# Patient Record
Sex: Female | Born: 1945
Health system: Southern US, Community
[De-identification: ages and names within clinical notes are randomized; demographics above are authoritative.]

## PROBLEM LIST (undated history)

## (undated) DIAGNOSIS — K224 Dyskinesia of esophagus: Secondary | ICD-10-CM

## (undated) DIAGNOSIS — J189 Pneumonia, unspecified organism: Secondary | ICD-10-CM

## (undated) DIAGNOSIS — M1990 Unspecified osteoarthritis, unspecified site: Secondary | ICD-10-CM

## (undated) DIAGNOSIS — E785 Hyperlipidemia, unspecified: Secondary | ICD-10-CM

## (undated) DIAGNOSIS — I10 Essential (primary) hypertension: Secondary | ICD-10-CM

## (undated) DIAGNOSIS — K219 Gastro-esophageal reflux disease without esophagitis: Secondary | ICD-10-CM

## (undated) DIAGNOSIS — K7030 Alcoholic cirrhosis of liver without ascites: Secondary | ICD-10-CM

## (undated) DIAGNOSIS — Z8489 Family history of other specified conditions: Secondary | ICD-10-CM

## (undated) DIAGNOSIS — F1020 Alcohol dependence, uncomplicated: Secondary | ICD-10-CM

## (undated) HISTORY — PX: TOTAL LAPAROSCOPIC HYSTERECTOMY WITH BILATERAL SALPINGO OOPHORECTOMY: SHX6845

## (undated) HISTORY — PX: OTHER SURGICAL HISTORY: SHX169

## (undated) HISTORY — PX: BUNIONECTOMY: SHX129

## (undated) HISTORY — PX: ABDOMINAL HYSTERECTOMY: SHX81

## (undated) HISTORY — DX: Hyperlipidemia, unspecified: E78.5

---

## 2004-11-26 ENCOUNTER — Ambulatory Visit: Payer: Self-pay | Admitting: Internal Medicine

## 2004-12-12 ENCOUNTER — Ambulatory Visit: Payer: Self-pay | Admitting: Internal Medicine

## 2005-06-13 ENCOUNTER — Other Ambulatory Visit: Payer: Self-pay

## 2005-06-13 ENCOUNTER — Emergency Department: Payer: Self-pay | Admitting: Internal Medicine

## 2005-06-16 ENCOUNTER — Ambulatory Visit: Payer: Self-pay | Admitting: Internal Medicine

## 2005-12-14 ENCOUNTER — Ambulatory Visit: Payer: Self-pay | Admitting: Internal Medicine

## 2007-01-18 ENCOUNTER — Ambulatory Visit: Payer: Self-pay | Admitting: Internal Medicine

## 2007-06-22 LAB — HM COLONOSCOPY: HM Colonoscopy: NORMAL

## 2008-02-16 LAB — HM PAP SMEAR

## 2008-02-16 LAB — CONVERTED CEMR LAB: Pap Smear: NORMAL

## 2009-12-13 ENCOUNTER — Ambulatory Visit: Payer: Self-pay | Admitting: Family Medicine

## 2009-12-13 ENCOUNTER — Encounter: Payer: Self-pay | Admitting: Family Medicine

## 2009-12-13 DIAGNOSIS — E785 Hyperlipidemia, unspecified: Secondary | ICD-10-CM | POA: Insufficient documentation

## 2009-12-13 DIAGNOSIS — M949 Disorder of cartilage, unspecified: Secondary | ICD-10-CM

## 2009-12-13 DIAGNOSIS — R030 Elevated blood-pressure reading, without diagnosis of hypertension: Secondary | ICD-10-CM | POA: Insufficient documentation

## 2009-12-13 DIAGNOSIS — M899 Disorder of bone, unspecified: Secondary | ICD-10-CM | POA: Insufficient documentation

## 2010-06-26 ENCOUNTER — Ambulatory Visit: Payer: Self-pay | Admitting: Family Medicine

## 2010-06-26 DIAGNOSIS — F438 Other reactions to severe stress: Secondary | ICD-10-CM | POA: Insufficient documentation

## 2010-06-27 LAB — CONVERTED CEMR LAB
ALT: 79 U/L — ABNORMAL HIGH
AST: 51 U/L — ABNORMAL HIGH
Albumin: 4.1 g/dL
Alkaline Phosphatase: 69 U/L
Bilirubin, Direct: 0.2 mg/dL
Cholesterol: 264 mg/dL — ABNORMAL HIGH
Direct LDL: 191.5 mg/dL
HDL: 61.6 mg/dL
Total Bilirubin: 1 mg/dL
Total CHOL/HDL Ratio: 4
Total Protein: 7.2 g/dL
Triglycerides: 97 mg/dL
VLDL: 19.4 mg/dL

## 2010-07-22 ENCOUNTER — Encounter: Payer: Self-pay | Admitting: Family Medicine

## 2010-07-30 LAB — HM MAMMOGRAPHY: HM Mammogram: NORMAL

## 2010-09-25 ENCOUNTER — Emergency Department: Payer: Self-pay | Admitting: Emergency Medicine

## 2010-09-25 ENCOUNTER — Encounter: Payer: Self-pay | Admitting: Family Medicine

## 2010-09-25 ENCOUNTER — Ambulatory Visit: Payer: Self-pay | Admitting: Family Medicine

## 2010-09-25 DIAGNOSIS — I10 Essential (primary) hypertension: Secondary | ICD-10-CM | POA: Insufficient documentation

## 2010-09-25 DIAGNOSIS — R42 Dizziness and giddiness: Secondary | ICD-10-CM | POA: Insufficient documentation

## 2010-09-25 DIAGNOSIS — I495 Sick sinus syndrome: Secondary | ICD-10-CM | POA: Insufficient documentation

## 2010-09-29 ENCOUNTER — Ambulatory Visit: Payer: Self-pay | Admitting: Family Medicine

## 2010-09-29 DIAGNOSIS — K224 Dyskinesia of esophagus: Secondary | ICD-10-CM | POA: Insufficient documentation

## 2010-10-17 ENCOUNTER — Telehealth (INDEPENDENT_AMBULATORY_CARE_PROVIDER_SITE_OTHER): Payer: Self-pay | Admitting: *Deleted

## 2010-10-21 ENCOUNTER — Ambulatory Visit: Payer: Self-pay | Admitting: Family Medicine

## 2010-10-27 ENCOUNTER — Ambulatory Visit: Payer: Self-pay | Admitting: Family Medicine

## 2010-10-27 DIAGNOSIS — R7401 Elevation of levels of liver transaminase levels: Secondary | ICD-10-CM | POA: Insufficient documentation

## 2010-10-27 DIAGNOSIS — R740 Nonspecific elevation of levels of transaminase and lactic acid dehydrogenase [LDH]: Secondary | ICD-10-CM

## 2010-10-27 DIAGNOSIS — R7402 Elevation of levels of lactic acid dehydrogenase (LDH): Secondary | ICD-10-CM | POA: Insufficient documentation

## 2010-11-19 ENCOUNTER — Ambulatory Visit: Payer: Self-pay | Admitting: Family Medicine

## 2010-11-19 DIAGNOSIS — J018 Other acute sinusitis: Secondary | ICD-10-CM | POA: Insufficient documentation

## 2010-11-28 ENCOUNTER — Ambulatory Visit: Payer: Self-pay | Admitting: Family Medicine

## 2010-11-28 ENCOUNTER — Telehealth (INDEPENDENT_AMBULATORY_CARE_PROVIDER_SITE_OTHER): Payer: Self-pay | Admitting: *Deleted

## 2010-11-28 DIAGNOSIS — J019 Acute sinusitis, unspecified: Secondary | ICD-10-CM | POA: Insufficient documentation

## 2011-01-06 NOTE — Assessment & Plan Note (Signed)
Summary: F/U [facility] ER ON 09/25/10/CLE   Vital Signs:  Patient profile:   65 year old female Height:      66 inches Weight:      184 pounds BMI:     29.81 Temp:     98.3 degrees F oral Pulse rate:   68 / minute Pulse rhythm:   regular BP sitting:   150 / 84  (right arm) Cuff size:   large  Vitals Entered By: Linde Gillis CMA Duncan Dull) (September 29, 2010 2:46 PM)  Serial Vital Signs/Assessments:  Time      Position  BP       Pulse  Resp  Temp     By                     140/82                         Ruthe Mannan MD  CC: ER follow up    History of Present Illness: 65 yo here for ER follow up.  Went to [facility] on 10/20 s/p syncopal episode. Was standing at sink, drank something warm and felt like it stuck.  Walked a few feet to the garage, felt "funny" and woke up on the floor. Hit her head and her knee but immediately regained consciousness. Husband was there. No confusion, loss of bladder or bowel. No visible seizures.  Does not routinely pass out but has had that sensation that something was stuck before. [facility] records reviewed. Cardiac enzymes negative. Head CT neg EKG within normal limits.   CBC, CMET within normal limits.   No recurrent episodes.    Current Medications (verified): 1)  Aspirin 81 Mg  Tabs (Aspirin) .... Take 1 Tablet By Mouth Once A Day 2)  Fish Oil   Oil (Fish Oil) .Marland Kitchen.. 1200 Mg. Capsules.  Takes 3 By Mouth Once Daily 3)  Calcium Carbonate-Vitamin D 600-400 Mg-Unit  Tabs (Calcium Carbonate-Vitamin D) .Marland Kitchen.. 1200 Mg.  Takes 2 Tablets By Mouth Once Daily 4)  Co Q-10 30 Mg  Caps (Coenzyme Q10) .Marland Kitchen.. 100 Mg. Takes 1 Capsule By Mouth Once Daily 5)  Milk Thistle 500 Mg Caps (Milk Thistle) .Marland Kitchen.. 1000 Mg. Capsule   Take 1 Capsule By Mouth Once Daily 6)  Alprazolam 0.25 Mg Tabs (Alprazolam) .Marland Kitchen.. 1 Tab By Mouth Three Times A Day As Needed Anxiety 7)  Pravastatin Sodium 10 Mg  Tabs (Pravastatin Sodium) .... One By Mouth At Bedtime  Allergies (verified): No Known Drug  Allergies  Past History:  Past Medical History: Last updated: 2009-12-27 Hyperlipidemia  Past Surgical History: Last updated: 2009/12/27 Hysterectomy 1992- menorrhagia  Family History: Last updated: 12/27/2009 Dad -died of complications of aortic valve replacement at 13 Mom -died of CHF at 58.  Social History: Last updated: December 27, 2009 [place] in [place].  Moved back here to be closer to children and granchildren from [place].   Never Smoked Alcohol use-no Drug use-no Regular exercise-yes  Risk Factors: Exercise: yes (2009/12/27)  Risk Factors: Smoking Status: never (12-27-09)  Review of Systems      See HPI General:  Denies malaise. Eyes:  Denies blurring. ENT:  Denies difficulty swallowing. CV:  Denies chest pain or discomfort. Resp:  Denies shortness of breath. GI:  Denies bloody stools, nausea, and vomiting.  Physical Exam  General:  Well-developed,well-nourished,in no acute distress; alert,appropriate and cooperative throughout examination Lungs:  Normal respiratory effort, chest expands symmetrically. Lungs are clear to  auscultation, no crackles or wheezes. Heart:  Normal rate and regular rhythm. S1 and S2 normal without gallop, murmur, click, rub or other extra sounds. Abdomen:  Bowel sounds positive,abdomen soft and non-tender without masses, organomegaly or hernias noted. Extremities:  no edema Neurologic:  alert & oriented X3 and gait normal.   Psych:  Cognition and judgment appear intact. Alert and cooperative with normal attention span and concentration. No apparent delusions, illusions, hallucinations   Impression & Recommendations:  Problem # 1:  SYNCOPE (ICD-780.2) Assessment New resolved, likely vasavagal. Likely due to esophageal spasm.  See below.    Problem # 2:  ESOPHAGEAL SPASM (ICD-530.5) Assessment: Comment Only Discussed causes and treatment for esophageal spasm.  Per pt, had negative EGD. If recurrs, deiscussed diltazem.   For  now, PPI and benzo seem to help.  Complete Medication List: 1)  Aspirin 81 Mg Tabs (Aspirin) .... Take 1 tablet by mouth once a day 2)  Fish Oil Oil (Fish oil) .Marland Kitchen.. 1200 mg. capsules.  takes 3 by mouth once daily 3)  Calcium Carbonate-vitamin D 600-400 Mg-unit Tabs (Calcium carbonate-vitamin d) .Marland Kitchen.. 1200 mg.  takes 2 tablets by mouth once daily 4)  Co Q-10 30 Mg Caps (Coenzyme q10) .Marland Kitchen.. 100 mg. takes 1 capsule by mouth once daily 5)  Milk Thistle 500 Mg Caps (Milk thistle) .Marland Kitchen.. 1000 mg. capsule   take 1 capsule by mouth once daily 6)  Alprazolam 0.25 Mg Tabs (Alprazolam) .Marland Kitchen.. 1 tab by mouth three times a day as needed anxiety 7)  Pravastatin Sodium 10 Mg Tabs (Pravastatin sodium) .... One by mouth at bedtime Prescriptions: ALPRAZOLAM 0.25 MG TABS (ALPRAZOLAM) 1 tab by mouth three times a day as needed anxiety  #30 x 0   Entered and Authorized by:   Ruthe Mannan MD   Signed by:   Ruthe Mannan MD on 09/29/2010   Method used:   Print then Give to Patient   RxID:   [id number]    Orders Added: 1)  Est. Patient Level IV [08657]    Current Allergies (reviewed today): No known allergies  Appended Document: F/U [facility] ER ON 09/25/10/CLE     Allergies: No Known Drug Allergies   Complete Medication List: 1)  Aspirin 81 Mg Tabs (Aspirin) .... Take 1 tablet by mouth once a day 2)  Fish Oil Oil (Fish oil) .Marland Kitchen.. 1200 mg. capsules.  takes 3 by mouth once daily 3)  Calcium Carbonate-vitamin D 600-400 Mg-unit Tabs (Calcium carbonate-vitamin d) .Marland Kitchen.. 1200 mg.  takes 2 tablets by mouth once daily 4)  Co Q-10 30 Mg Caps (Coenzyme q10) .Marland Kitchen.. 100 mg. takes 1 capsule by mouth once daily 5)  Milk Thistle 500 Mg Caps (Milk thistle) .Marland Kitchen.. 1000 mg. capsule   take 1 capsule by mouth once daily 6)  Alprazolam 0.25 Mg Tabs (Alprazolam) .Marland Kitchen.. 1 tab by mouth three times a day as needed anxiety 7)  Pravastatin Sodium 10 Mg Tabs (Pravastatin sodium) .... One by mouth at bedtime  Other Orders: Admin 1st Vaccine  (84696) Flu Vaccine 70yrs + [phone])   Orders Added: 1)  Admin 1st Vaccine [90471] 2)  Flu Vaccine 2yrs + [41324]    Flu Vaccine Consent Questions     Do you have a history of severe allergic reactions to this vaccine? no    Any prior history of allergic reactions to egg and/or gelatin? no    Do you have a sensitivity to the preservative Thimersol? no    Do you have a past  history of Guillan-Barre Syndrome? no    Do you currently have an acute febrile illness? no    Have you ever had a severe reaction to latex? no    Vaccine information given and explained to patient? yes    Are you currently pregnant? no    Lot Number:[id number]   Exp Date:06/06/2011   Site Given  Left Deltoid IM       .[email or web address]

## 2011-01-06 NOTE — Assessment & Plan Note (Signed)
Summary: NEW PT TO ESTABH/DLO   Vital Signs:  Patient profile:   65 year old female Height:      66 inches Weight:      185.38 pounds BMI:     30.03 Temp:     98.6 degrees F oral Pulse rate:   76 / minute Pulse rhythm:   regular BP sitting:   156 / 78  (left arm) Cuff size:   regular  Vitals Entered By: Delilah Shan CMA (AAMA) (December 13, 2009 11:00 AM) CC: New Patient to Establish   History of Present Illness: 65 yo here to establish care, relocating here from [place] to be closer to her children and grandchildren.  HLD- Has had HLD for years.  Has tried multiple statins which caused myalgias and GERD. Had labs checked in 11/10- LDL was 194, TG 84.  Started Fish Oil Tablets 1200 mg three times a day at that time to see if hopefully that would help.  No personal or family h/o CAD.  Non diabetic.   Of note, history of elevated LFTs for years.  Elevated BP reading- told she has white coat HTN.  When she checks it at home, 120s-130s range, she cannot remember diastolic readings.  No CP, SOB, blurred vision, or HA.  Osteopenia- BMD on 02/28/2009- -1.3 at left femoral neck, -1.3 at right femoral neck.  Has been taking Caltrate daily.  Well woman- UTD mammogram, pap, FLP, BMET. Never had zostavax. She and her husband recently joined [employer].  Preventive Screening-Counseling & Management  Alcohol-Tobacco     Smoking Status: never  Caffeine-Diet-Exercise     Does Patient Exercise: yes      Drug Use:  no.    Allergies (verified): No Known Drug Allergies  Past History:  Past Medical History: Hyperlipidemia  Past Surgical History: Hysterectomy 1992- menorrhagia  Family History: Dad -died of complications of aortic valve replacement at 82 Mom -died of CHF at 38.  Social History: LI[employer] in [place].  Moved back here to be closer to children and granchildren from [place].   Never Smoked Alcohol use-no Drug use-no Regular exercise-yes Smoking Status:  never Drug Use:   no Does Patient Exercise:  yes  Review of Systems      See HPI General:  Denies fever and weakness. Eyes:  Denies blurring. ENT:  Denies difficulty swallowing. CV:  Denies chest pain or discomfort. Resp:  Denies shortness of breath. GI:  Denies abdominal pain and bloody stools. Derm:  Denies rash. Neuro:  Denies headaches and visual disturbances. Psych:  Denies anxiety and depression.  Physical Exam  General:  Well-developed,well-nourished,in no acute distress; alert,appropriate and cooperative throughout examination Eyes:  No corneal or conjunctival inflammation noted. EOMI. Perrla. Funduscopic exam benign, without hemorrhages, exudates or papilledema. Vision grossly normal. Ears:  External ear exam shows no significant lesions or deformities.  Otoscopic examination reveals clear canals, tympanic membranes are intact bilaterally without bulging, retraction, inflammation or discharge. Hearing is grossly normal bilaterally. Mouth:  Oral mucosa and oropharynx without lesions or exudates.  Teeth in good repair. Lungs:  Normal respiratory effort, chest expands symmetrically. Lungs are clear to auscultation, no crackles or wheezes. Heart:  Normal rate and regular rhythm. S1 and S2 normal without gallop, murmur, click, rub or other extra sounds. Abdomen:  Bowel sounds positive,abdomen soft and non-tender without masses, organomegaly or hernias noted. Msk:  No deformity or scoliosis noted of thoracic or lumbar spine.   Extremities:  No clubbing, cyanosis, edema, or deformity  noted with normal full range of motion of all joints.   Skin:  Intact without suspicious lesions or rashes Psych:  Cognition and judgment appear intact. Alert and cooperative with normal attention span and concentration. No apparent delusions, illusions, hallucinations   Impression & Recommendations:  Problem # 1:  HYPERLIPIDEMIA (ICD-272.4) Assessment Unchanged Will try fish oil although counselled her that it will  likely take medical managment, can try another class like gemfibrozil.  Recheck in 6 months.  Problem # 2:  ELEVATED BP READING WITHOUT DX HYPERTENSION (ICD-796.2) Assessment: New May be white coat HTN.  Advised to keep a log, bring to next appt.  Problem # 3:  Preventive Health Care (ICD-V70.0) Assessment: Comment Only Reviewed preventive care protocols, scheduled due services, and updated immunizations Discussed nutrition, exercise, diet, and healthy lifestyle. UTD on all except Zostavax.  She will check with her insurance company.  Problem # 4:  OSTEOPENIA (ICD-733.90) Assessment: Unchanged Continue caltrate.  Repeat in 2 years. Her updated medication list for this problem includes:    Calcium Carbonate-vitamin D 600-400 Mg-unit Tabs (Calcium carbonate-vitamin d) .Marland Kitchen... 1200 mg.  takes 2 tablets by mouth once daily  Complete Medication List: 1)  Aspirin 81 Mg Tabs (Aspirin) .... Take 1 tablet by mouth once a day 2)  Fish Oil Oil (Fish oil) .Marland Kitchen.. 1200 mg. capsules.  takes 3 by mouth once daily 3)  Calcium Carbonate-vitamin D 600-400 Mg-unit Tabs (Calcium carbonate-vitamin d) .Marland Kitchen.. 1200 mg.  takes 2 tablets by mouth once daily 4)  Co Q-10 30 Mg Caps (Coenzyme q10) .Marland Kitchen.. 100 mg. takes 1 capsule by mouth once daily 5)  Milk Thistle 500 Mg Caps (Milk thistle) .Marland Kitchen.. 1000 mg. capsule   take 1 capsule by mouth once daily  Patient Instructions: 1)  It was really nice to meet you, Ms. Crooker. 2)  Make a morning appointment to come see me in 6 months fasting. 3)  Have a wonderful weekend. 4)  Please check with your insurance company to see if they cover Zostavax (shingles vaccine).  Current Allergies (reviewed today): No known allergies   Flex Sig Next Due:  Not Indicated Colonoscopy Result Date:  06/22/2007 Colonoscopy Result:  normal Colonoscopy Next Due:  10 yr Hemoccult Next Due:  Not Indicated PAP Result Date:  02/16/2008 PAP Result:  normal PAP Next Due:  Not Indicated Mammogram  Result Date:  02/15/2009 Mammogram Result:  normal Mammogram Next Due:  1 yr BMD osteopenia 02/2009

## 2011-01-06 NOTE — Assessment & Plan Note (Signed)
Summary: DISCUSS CHOLESTEROL AND LIVER TEST/NT   Vital Signs:  Patient profile:   65 year old female Height:      66 inches Weight:      185 pounds BMI:     29.97 Temp:     98.2 degrees F oral Pulse rate:   72 / minute Pulse rhythm:   regular BP sitting:   140 / 90  (left arm) Cuff size:   large  Vitals Entered By: Linde Gillis CMA Duncan Dull) (October 27, 2010 8:09 AM) CC: discuss cholesterol and liver test   History of Present Illness: 65 yo here to follow up cholesterol.  HLD- Has had HLD for years.  Has tried multiple statins which caused myalgias and GERD.  Started Pravastatin 10 mg in July, tolerating it very well. Lipid panel improved but not yet at goal.  LDL 152 ( was 191), HDL 77, TG 91.   Of note, history of elevated LFTs for years.  Only slightly increased from July, since starting Pravachol- AST 65 ( was 51), ALT 96 ( was 79).  Elevated BP reading- told she has white coat HTN.  When she checks it at home, 120s-130s range.  No CP, HA, blurred vision, or SOB.    Current Medications (verified): 1)  Aspirin 81 Mg  Tabs (Aspirin) .... Take 1 Tablet By Mouth Once A Day 2)  Fish Oil   Oil (Fish Oil) .Marland Kitchen.. 1200 Mg. Capsules.  Takes 3 By Mouth Once Daily 3)  Calcium Carbonate-Vitamin D 600-400 Mg-Unit  Tabs (Calcium Carbonate-Vitamin D) .Marland Kitchen.. 1200 Mg.  Takes 2 Tablets By Mouth Once Daily 4)  Co Q-10 30 Mg  Caps (Coenzyme Q10) .Marland Kitchen.. 100 Mg. Takes 1 Capsule By Mouth Once Daily 5)  Milk Thistle 500 Mg Caps (Milk Thistle) .Marland Kitchen.. 1000 Mg. Capsule   Take 1 Capsule By Mouth Once Daily 6)  Alprazolam 0.25 Mg Tabs (Alprazolam) .Marland Kitchen.. 1 Tab By Mouth Three Times A Day As Needed Anxiety 7)  Pravastatin Sodium 20 Mg  Tabs (Pravastatin Sodium) .... One By Mouth At Bedtime  Allergies (verified): No Known Drug Allergies  Past History:  Past Medical History: Last updated: 12/27/2009 Hyperlipidemia  Past Surgical History: Last updated: 12-27-09 Hysterectomy 1992- menorrhagia  Family  History: Last updated: 12/27/09 Dad -died of complications of aortic valve replacement at 39 Mom -died of CHF at 71.  Social History: Last updated: 12-27-2009 [employer] in [place].  Moved back here to be closer to children and granchildren from [place].   Never Smoked Alcohol use-no Drug use-no Regular exercise-yes  Risk Factors: Exercise: yes (2009-12-27)  Risk Factors: Smoking Status: never (12/27/09)  Review of Systems      See HPI General:  Denies malaise. CV:  Denies chest pain or discomfort. Resp:  Denies shortness of breath. MS:  Denies joint pain, joint redness, joint swelling, muscle aches, and muscle weakness.  Physical Exam  General:  Well-developed,well-nourished,in no acute distress; alert,appropriate and cooperative throughout examination Mouth:  Oral mucosa and oropharynx without lesions or exudates.  Teeth in good repair. Lungs:  Normal respiratory effort, chest expands symmetrically. Lungs are clear to auscultation, no crackles or wheezes. Heart:  Normal rate and regular rhythm. S1 and S2 normal without gallop, murmur, click, rub or other extra sounds. Psych:  Cognition and judgment appear intact. Alert and cooperative with normal attention span and concentration. No apparent delusions, illusions, hallucinations   Impression & Recommendations:  Problem # 1:  TRANSAMINASES, SERUM, ELEVATED (ICD-790.4) Assessment Deteriorated Still below three  times upper limits of normal, will continue Pravastatin and recheck LFTs in 6 months.  Problem # 2:  HYPERLIPIDEMIA (ICD-272.4) Assessment: Improved Improved but still not at goal.  Will increase Pravastatin dosage to 20 mg daily. Her updated medication list for this problem includes:    Pravastatin Sodium 20 Mg Tabs (Pravastatin sodium) ..... One by mouth at bedtime  Problem # 3:  UNSPECIFIED ESSENTIAL HYPERTENSION (ICD-401.9) Assessment: Unchanged White coat HTN, continue to check BP at home.  Complete  Medication List: 1)  Aspirin 81 Mg Tabs (Aspirin) .... Take 1 tablet by mouth once a day 2)  Fish Oil Oil (Fish oil) .Marland Kitchen.. 1200 mg. capsules.  takes 3 by mouth once daily 3)  Calcium Carbonate-vitamin D 600-400 Mg-unit Tabs (Calcium carbonate-vitamin d) .Marland Kitchen.. 1200 mg.  takes 2 tablets by mouth once daily 4)  Co Q-10 30 Mg Caps (Coenzyme q10) .Marland Kitchen.. 100 mg. takes 1 capsule by mouth once daily 5)  Milk Thistle 500 Mg Caps (Milk thistle) .Marland Kitchen.. 1000 mg. capsule   take 1 capsule by mouth once daily 6)  Alprazolam 0.25 Mg Tabs (Alprazolam) .Marland Kitchen.. 1 tab by mouth three times a day as needed anxiety 7)  Pravastatin Sodium 20 Mg Tabs (Pravastatin sodium) .... One by mouth at bedtime  Patient Instructions: 1)  Let's increase your Pravastatin to 20 mg daily (you can double the 10 mg dose). 2)  Let's redraw labs in 6 months- lipid panel, hepatic panel (272.4) Prescriptions: PRAVASTATIN SODIUM 20 MG  TABS (PRAVASTATIN SODIUM) one by mouth at bedtime  #90 x 3   Entered and Authorized by:   Ruthe Mannan MD   Signed by:   Ruthe Mannan MD on 10/27/2010   Method used:   Print then Give to Patient   RxID:   [id number]    Orders Added: 1)  Est. Patient Level IV [14782]    Current Allergies (reviewed today): No known allergies

## 2011-01-06 NOTE — Letter (Signed)
Summary: [facility]   Imported By: Sherian Rein 10/07/2010 09:19:58  _____________________________________________________________________  External Attachment:    Type:   Image     Comment:   External Document

## 2011-01-06 NOTE — Assessment & Plan Note (Signed)
Summary: TIGHTNESS IN CHEST/EVM   Vital Signs:  Patient Profile:   65 Years Old Female CC:      Chest pressure, with prior syncope / rwt Height:     66 inches O2 Sat:      96 % O2 treatment:    Room Air Pulse rate:   68 / minute Pulse (ortho):   70 / minute Pulse rhythm:   regular Resp:     18 per minute BP supine:   172 / 84  (right arm) BP standing:   168 / 76  (right arm)  Pt. in pain?   yes    Location:   chest    Intensity:   5    Type:       heaviness  Vitals Entered By: Levonne Spiller EMT-P (September 25, 2010 11:56 AM)              Is Patient Diabetic? No      Current Allergies: No known allergies History of Present Illness History from: patient and spouse Chief Complaint: Chest pressure, with  syncope / rwt History of Present Illness: The patient reported that she was in the kitchen this morning drinking coffee and she got up to put some things into the recycling bin and she felt a heavy chest pressure, tightness and squeezing sensation, then passed out for a minute.  Her husband says that he found her on the floor.  Pt said that she hit her head.  She did not bite her tongue,  She had no SOB but felt lightheaded and dizzy.  She called her PCP office but she was booked up and was told to come here.  The patient arrived here for evaluation.  She is still feeling lightheaded.  Husband said when she got up from the passing out episode, they checked her BP and it was 125/70.  Here her BP was quite high at 193/86, retested 171/79.  Pt says that she did not have any further chest discomfort after the initial event.  She said that she did not go to the ER because she has gone multiple times with Chest Pressure and was told it was esophageal spasm.  Pt says that she has been thru a battery of tests in [place] like a nuclear stress test, EGD, Echocardiogram and was essentially all unremarkable.   After doing an initial assessment EMS was called to transport patiaent to the ER for  further evaluation and treatment.    REVIEW OF SYSTEMS Constitutional Symptoms      Denies fever, chills, night sweats, weight loss, weight gain, and fatigue.      Comments: lightheaded Eyes       Denies change in vision, eye pain, eye discharge, glasses, contact lenses, and eye surgery. Ear/Nose/Throat/Mouth       Denies hearing loss/aids, change in hearing, ear pain, ear discharge, dizziness, frequent runny nose, frequent nose bleeds, sinus problems, sore throat, hoarseness, and tooth pain or bleeding.  Respiratory       Denies dry cough, productive cough, wheezing, shortness of breath, asthma, bronchitis, and emphysema/COPD.  Cardiovascular       Complains of chest pain and fanting.      Denies murmurs, tires easily with exhertion, and fainting.    Gastrointestinal       Denies stomach pain, nausea/vomiting, diarrhea, constipation, blood in bowel movements, and indigestion. Genitourniary       Denies painful urination, kidney stones, and loss of urinary control. Neurological  Denies paralysis and seizures. Musculoskeletal       Denies muscle pain, joint pain, joint stiffness, decreased range of motion, redness, swelling, muscle weakness, and gout.  Skin       Denies bruising, unusual mles/lumps or sores, and hair/skin or nail changes.  Psych       Denies mood changes, temper/anger issues, anxiety/stress, speech problems, depression, and sleep problems.  Past History:  Past Medical History: Last updated: 01/03/10 Hyperlipidemia  Past Surgical History: Last updated: 03-Jan-2010 Hysterectomy 1992- menorrhagia  Family History: Last updated: 01-03-10 Dad -died of complications of aortic valve replacement at 62 Mom -died of CHF at 103.  Social History: Last updated: January 03, 2010 LIves in [place].  Moved back here to be closer to children and granchildren from [place].   Never Smoked Alcohol use-no Drug use-no Regular exercise-yes  Risk Factors: Exercise: yes  (01/03/2010)  Risk Factors: Smoking Status: never (01/03/10)  Family History: Reviewed history from January 03, 2010 and no changes required. Dad -died of complications of aortic valve replacement at 66 Mom -died of CHF at 58.  Social History: Reviewed history from 01/03/2010 and no changes required. LIves in [place].  Moved back here to be closer to children and granchildren from [place].   Never Smoked Alcohol use-no Drug use-no Regular exercise-yes Physical Exam General appearance: well developed, well nourished, no distress, patient unsteady on feet Head: normocephalic, atraumatic Eyes: conjunctivae and lids normal Pupils: equal, round, reactive to light Ears: normal, no lesions or deformities Nasal: mucosa pink, nonedematous, no septal deviation, turbinates normal Oral/Pharynx: tongue normal, posterior pharynx without erythema or exudate Neck: neck supple,  trachea midline, no masses Chest/Lungs: no rales, wheezes, or rhonchi bilateral, breath sounds equal without effort Heart: regular rate and  rhythm, no murmur Abdomen: soft, non-tender without obvious organomegaly Extremities: normal extremities Neurological: grossly intact and non-focal Skin: no obvious rashes or lesions MSE: oriented to time, place, and person Assessment New Problems: DIZZINESS (ICD-780.4) UNSPECIFIED ESSENTIAL HYPERTENSION (ICD-401.9) SINUS BRADYCARDIA (ICD-427.81) SYNCOPE (ICD-780.2) CHEST PAIN, ACUTE (ICD-786.50)   Patient Education: Patient and/or caregiver instructed in the following: fluids, Tylenol prn. Patient was transferred to ER by EMS   Plan Planning Comments:   Patient was transferred to ER by EMS  Follow Up: Follow up on an as needed basis, Follow up with Primary Physician  The patient and/or caregiver has been counseled thoroughly with regard to medications prescribed including dosage, schedule, interactions, rationale for use, and possible side effects and they verbalize  understanding.  Diagnoses and expected course of recovery discussed and will return if not improved as expected or if the condition worsens. Patient and/or caregiver verbalized understanding.   Patient Instructions: 1)  Go with EMS to the ER for a thorough evaluation for syncope and chest discomfort.    We monitored the patient closely for 35 mins until EMS arrived to transfer the patient to the emergency department for evaluation and treatment.  Rodney Langton, M.D., F.A.A.F.P.  September 25, 2010

## 2011-01-06 NOTE — Assessment & Plan Note (Signed)
Summary: 6 MONTH FOLLOW UP FASTING LABS/[facility]   Vital Signs:  Patient profile:   65 year old female Weight:      183.25 pounds Temp:     98.8 degrees F oral Pulse rate:   76 / minute Pulse rhythm:   regular BP sitting:   140 / 80  (right arm) Cuff size:   large  Vitals Entered By: Janee Morn CMA (June 26, 2010 8:14 AM) CC: 6 month f/u   History of Present Illness: 65 yo here for 6 month follow up.   HLD- Has had HLD for years.  Has tried multiple statins which caused myalgias and GERD. Had labs checked in 11/10- LDL was 194, TG 84.  Restarted fish oil 6 months ago.  No personal or family h/o CAD.  Non diabetic.   Of note, history of elevated LFTs for years.  Fasting today for recheck.    Elevated BP reading- told she has white coat HTN.  When she checks it at home, 120s-130s range.  No CP, HA, blurred vision, or SOB.    Osteopenia- BMD on 02/28/2009- -1.3 at left femoral neck, -1.3 at right femoral neck.  Has been taking Caltrate daily.  Situational stressors- brother is in ICU, nephew just died of sudden cardiac arrest.  Handling things "as well as she can." Relies on her faith, does get tearful and anxious at times.  No SI or HI.  Current Medications (verified): 1)  Aspirin 81 Mg  Tabs (Aspirin) .... Take 1 Tablet By Mouth Once A Day 2)  Fish Oil   Oil (Fish Oil) .Marland Kitchen.. 1200 Mg. Capsules.  Takes 3 By Mouth Once Daily 3)  Calcium Carbonate-Vitamin D 600-400 Mg-Unit  Tabs (Calcium Carbonate-Vitamin D) .Marland Kitchen.. 1200 Mg.  Takes 2 Tablets By Mouth Once Daily 4)  Co Q-10 30 Mg  Caps (Coenzyme Q10) .Marland Kitchen.. 100 Mg. Takes 1 Capsule By Mouth Once Daily 5)  Milk Thistle 500 Mg Caps (Milk Thistle) .Marland Kitchen.. 1000 Mg. Capsule   Take 1 Capsule By Mouth Once Daily 6)  Alprazolam 0.25 Mg Tabs (Alprazolam) .Marland Kitchen.. 1 Tab By Mouth Three Times A Day As Needed Anxiety  Allergies (verified): No Known Drug Allergies  Past History:  Past Medical History: Last updated: 30-Dec-2009 Hyperlipidemia  Past Surgical  History: Last updated: 2009/12/30 Hysterectomy 1992- menorrhagia  Family History: Last updated: 2009-12-30 Dad -died of complications of aortic valve replacement at 82 Mom -died of CHF at 78.  Social History: Last updated: Dec 30, 2009 LIves in [place].  Moved back here to be closer to children and granchildren from [place].   Never Smoked Alcohol use-no Drug use-no Regular exercise-yes  Risk Factors: Exercise: yes (12/30/2009)  Risk Factors: Smoking Status: never (2009/12/30)  Review of Systems      See HPI Psych:  Complains of anxiety and easily tearful; denies mental problems, panic attacks, sense of great danger, suicidal thoughts/plans, thoughts of violence, unusual visions or sounds, and thoughts /plans of harming others.  Physical Exam  General:  Well-developed,well-nourished,in no acute distress; alert,appropriate and cooperative throughout examination Lungs:  Normal respiratory effort, chest expands symmetrically. Lungs are clear to auscultation, no crackles or wheezes. Heart:  Normal rate and regular rhythm. S1 and S2 normal without gallop, murmur, click, rub or other extra sounds. Psych:  Cognition and judgment appear intact. Alert and cooperative with normal attention span and concentration. No apparent delusions, illusions, hallucinations   Impression & Recommendations:  Problem # 1:  ANXIETY, SITUATIONAL (ICD-308.3) Assessment New Very appropriate.  Time spent with patient 25 minutes, more than 50% of this time was spent counseling patient on her anxiety. Will give prescription for Xanax to use as needed.   Follow up as needed.  Problem # 2:  HYPERLIPIDEMIA (ICD-272.4) Assessment: Unchanged Recheck FLP and hepatic panel today. Orders: Venipuncture (61607) TLB-Hepatic/Liver Function Pnl (80076-HEPATIC) TLB-Lipid Panel (80061-LIPID)  Complete Medication List: 1)  Aspirin 81 Mg Tabs (Aspirin) .... Take 1 tablet by mouth once a day 2)  Fish Oil Oil  (Fish oil) .Marland Kitchen.. 1200 mg. capsules.  takes 3 by mouth once daily 3)  Calcium Carbonate-vitamin D 600-400 Mg-unit Tabs (Calcium carbonate-vitamin d) .Marland Kitchen.. 1200 mg.  takes 2 tablets by mouth once daily 4)  Co Q-10 30 Mg Caps (Coenzyme q10) .Marland Kitchen.. 100 mg. takes 1 capsule by mouth once daily 5)  Milk Thistle 500 Mg Caps (Milk thistle) .Marland Kitchen.. 1000 mg. capsule   take 1 capsule by mouth once daily 6)  Alprazolam 0.25 Mg Tabs (Alprazolam) .Marland Kitchen.. 1 tab by mouth three times a day as needed anxiety  Other Orders: Radiology Referral (Radiology)  Patient Instructions: 1)  Please stop by to see Shirlee Limerick on your way out. Prescriptions: ALPRAZOLAM 0.25 MG TABS (ALPRAZOLAM) 1 tab by mouth three times a day as needed anxiety  #30 x 0   Entered and Authorized by:   Ruthe Mannan MD   Signed by:   Ruthe Mannan MD on 06/26/2010   Method used:   Print then Give to Patient   RxID:   [id number]   Current Allergies (reviewed today): No known allergies

## 2011-01-06 NOTE — Progress Notes (Signed)
----   Converted from flag ---- ---- 10/16/2010 1:47 PM, Ruthe Mannan MD wrote: BMET (410.9), fasting lipid panel, hepatic panel (272.4)  ---- 10/16/2010 1:38 PM, Liane Comber CMA (AAMA) wrote: Lab orders please! Good Morning! This pt is scheduled for labs Tuesday, no f/u with you is scheduled, which labs to draw and dx codes to use? Thanks Tasha ------------------------------

## 2011-01-06 NOTE — Letter (Signed)
Summary: Records Dated 06-01-07 thru 10-10-09/[facility]  Records Dated 06-01-07 thru 10-10-09/[facility]   Imported By: Lanelle Bal 12/20/2009 10:56:34  _____________________________________________________________________  External Attachment:    Type:   Image     Comment:   External Document

## 2011-01-08 NOTE — Progress Notes (Signed)
Summary: pt requests z-pack  Phone Note Call from Patient Call back at Home Phone [phone]   Caller: Patient Summary of Call: Pt was seen on 12/14 and diagnosed with bronchitis.  She was given augmentin.  Pt will finish that tomorrow, still has the cough, still feels stuffy and tired.  Dr Dayton Martes had told her that she would call in z-pack if not better.  Pt is requesting that.  [facility]. Initial call taken by: Lowella Petties CMA, AAMA,  November 28, 2010 9:01 AM  Follow-up for Phone Call        i don't see that in her note, and that is not my practice pattern. Against our practice policy to call in ABX without evaluation.  If not better, needs to be rechecked face to face.  I am happy to recheck the patient. Hannah Beat MD  November 28, 2010 9:15 AM   Additional Follow-up for Phone Call Additional follow up Details #1::        Patient made appt at 3:15 for appt Additional Follow-up by: Benny Lennert CMA Duncan Dull),  November 28, 2010 9:22 AM

## 2011-01-08 NOTE — Assessment & Plan Note (Signed)
Summary: ? sinus infection/hmw   Vital Signs:  Patient profile:   65 year old female Height:      66 inches Weight:      188.25 pounds BMI:     30.49 Temp:     98.1 degrees F oral Pulse rate:   72 / minute Pulse rhythm:   regular BP sitting:   110 / 64  (left arm) Cuff size:   regular  Vitals Entered By: Benny Lennert CMA Duncan Dull) (November 28, 2010 4:09 PM)  History of Present Illness: Chief complaint ? sinus infection  65 year old female:    Acute Visit History:      The patient complains of cough, fever, headache, nasal discharge, and sinus problems.  These symptoms began 3 weeks ago.  She denies abdominal pain, chest pain, diarrhea, nausea, rash, sore throat, and vomiting.        The cough interferes with her sleep.  The character of the cough is described as nonproductive.  She has no history of COPD.        She complains of sinus pressure, teeth aching, ears being blocked, nasal congestion, purulent drainage, and frontal headache.  The patient has had a past history of sinusitis and sinusitis in the last 2 months.        Urine output has been normal.  She is tolerating clear liquids.         Allergies (verified): No Known Drug Allergies  Past History:  Past medical, surgical, family and social histories (including risk factors) reviewed, and no changes noted (except as noted below).  Past Medical History: Reviewed history from 12/13/2009 and no changes required. Hyperlipidemia  Past Surgical History: Reviewed history from 12/13/2009 and no changes required. Hysterectomy 1992- menorrhagia  Family History: Reviewed history from 12/13/2009 and no changes required. Dad -died of complications of aortic valve replacement at 62 Mom -died of CHF at 28.  Social History: Reviewed history from 12/13/2009 and no changes required. LIves in [place].  Moved back here to be closer to children and granchildren from [place].   Never Smoked Alcohol use-no Drug  use-no Regular exercise-yes  Review of Systems       REVIEW OF SYSTEMS GEN: Acute illness details above. CV: No chest pain or SOB GI: No noted N or V Otherwise, pertinent positives and negatives are noted in the HPI.   Physical Exam  Additional Exam:  Gen: WDWN, NAD; alert,appropriate and cooperative throughout exam  HEENT: Normocephalic and atraumatic. Throat clear, w/o exudate, no LAD, R TM clear, L TM - good landmarks, No fluid present. rhinnorhea.  Left frontal and maxillary sinuses: Tender, max Right frontal and maxillary sinuses: Tender, max  Neck: No ant or post LAD  CV: RRR, No M/G/R  Pulm: Breathing comfortably in no resp distress. no w/c/r  Abd: S,NT,ND,+BS  Extr: no c/c/e  Psych: full affect, pleasant    Impression & Recommendations:  Problem # 1:  SINUSITIS - ACUTE-NOS (ICD-461.9) Assessment New  The following medications were removed from the medication list:    Augmentin 875-125 Mg Tabs (Amoxicillin-pot clavulanate) .Marland Kitchen... Take one tablet by mouth two times a day for ten days Her updated medication list for this problem includes:    Hydrocodone-homatropine 5-1.5 Mg/61ml Syrp (Hydrocodone-homatropine) .Marland Kitchen... 1 - 2 tsp by mouth at bedtime as needed bad coughing    Levaquin 500 Mg Tabs (Levofloxacin) .Marland Kitchen... Take 1 tab by mouth daily  Instructed on treatment. Call if symptoms persist or worsen.  Complete Medication List: 1)  Aspirin 81 Mg Tabs (Aspirin) .... Take 1 tablet by mouth once a day 2)  Fish Oil Oil (Fish oil) .Marland Kitchen.. 1200 mg. capsules.  takes 3 by mouth once daily 3)  Calcium Carbonate-vitamin D 600-400 Mg-unit Tabs (Calcium carbonate-vitamin d) .Marland Kitchen.. 1200 mg.  takes 2 tablets by mouth once daily 4)  Co Q-10 30 Mg Caps (Coenzyme q10) .Marland Kitchen.. 100 mg. takes 1 capsule by mouth once daily 5)  Milk Thistle 500 Mg Caps (Milk thistle) .Marland Kitchen.. 1000 mg. capsule   take 1 capsule by mouth once daily 6)  Alprazolam 0.25 Mg Tabs (Alprazolam) .Marland Kitchen.. 1 tab by mouth three  times a day as needed anxiety 7)  Pravastatin Sodium 20 Mg Tabs (Pravastatin sodium) .... One by mouth at bedtime 8)  Hydrocodone-homatropine 5-1.5 Mg/49ml Syrp (Hydrocodone-homatropine) .Marland Kitchen.. 1 - 2 tsp by mouth at bedtime as needed bad coughing 9)  Levaquin 500 Mg Tabs (Levofloxacin) .... Take 1 tab by mouth daily  Patient Instructions: 1)  SINUSITIS 2)  Sinuses are cavities in facial skeleton that drain to nose. Impaired drainage and obstruction of sinus passages main cause. 3)  Treatment: 4)  1. Take all Antibiotics 5)  2. Open nasal and sinus canals: Oral decongestant: Sudafed. (CAUTION IF HIGH BLOOD PRESSURE) 6)  3. Steam inhalation 7)  4. Humidifier in room 8)  5. Frequent nasal saline irrigation 9)  6. Moist heat compresses to face 10)  7. Tylenol or Ibuprofen for pain and fever, follow directions on bottle.  Prescriptions: LEVAQUIN 500 MG TABS (LEVOFLOXACIN) Take 1 tab by mouth daily  #10 x 0   Entered and Authorized by:   Hannah Beat MD   Signed by:   Hannah Beat MD on 11/28/2010   Method used:   Print then Give to Patient   RxID:   [id number] HYDROCODONE-HOMATROPINE 5-1.5 MG/5ML SYRP (HYDROCODONE-HOMATROPINE) 1 - 2 tsp by mouth at bedtime as needed bad coughing  #8 oz. x 0   Entered and Authorized by:   Hannah Beat MD   Signed by:   Hannah Beat MD on 11/28/2010   Method used:   Print then Give to Patient   RxID:   [id number]    Orders Added: 1)  Est. Patient Level IV [47096]    Current Allergies (reviewed today): No known allergies

## 2011-01-08 NOTE — Assessment & Plan Note (Signed)
Summary: ST,HA,COUGH/CLE   Vital Signs:  Patient profile:   65 year old female Height:      66 inches Weight:      184.12 pounds Temp:     98.2 degrees F BP sitting:   102 / 70  Vitals Entered By: Linde Gillis CMA Duncan Dull) (November 19, 2010 12:22 PM)  History of Present Illness: 65 yo here for URI symptoms.  2 weeks ago, developped dry cough, runny nose, sore throat. Took Tussin DM and felt like symptoms were resolving. Past several days, symptoms worsening. Now with sinus pressure, cough is now productive. Felt feverish last night. No CP or SOB.  Current Medications (verified): 1)  Aspirin 81 Mg  Tabs (Aspirin) .... Take 1 Tablet By Mouth Once A Day 2)  Fish Oil   Oil (Fish Oil) .Marland Kitchen.. 1200 Mg. Capsules.  Takes 3 By Mouth Once Daily 3)  Calcium Carbonate-Vitamin D 600-400 Mg-Unit  Tabs (Calcium Carbonate-Vitamin D) .Marland Kitchen.. 1200 Mg.  Takes 2 Tablets By Mouth Once Daily 4)  Co Q-10 30 Mg  Caps (Coenzyme Q10) .Marland Kitchen.. 100 Mg. Takes 1 Capsule By Mouth Once Daily 5)  Milk Thistle 500 Mg Caps (Milk Thistle) .Marland Kitchen.. 1000 Mg. Capsule   Take 1 Capsule By Mouth Once Daily 6)  Alprazolam 0.25 Mg Tabs (Alprazolam) .Marland Kitchen.. 1 Tab By Mouth Three Times A Day As Needed Anxiety 7)  Pravastatin Sodium 20 Mg  Tabs (Pravastatin Sodium) .... One By Mouth At Bedtime 8)  Augmentin 875-125 Mg Tabs (Amoxicillin-Pot Clavulanate) .... Take One Tablet By Mouth Two Times A Day For Ten Days  Allergies (verified): No Known Drug Allergies  Past History:  Past Medical History: Last updated: 01-01-10 Hyperlipidemia  Past Surgical History: Last updated: 01-Jan-2010 Hysterectomy 1992- menorrhagia  Family History: Last updated: January 01, 2010 Dad -died of complications of aortic valve replacement at 63 Mom -died of CHF at 32.  Social History: Last updated: 01-01-10 LIves in [place].  Moved back here to be closer to children and granchildren from [place].   Never Smoked Alcohol use-no Drug use-no Regular  exercise-yes  Risk Factors: Exercise: yes (2010-01-01)  Risk Factors: Smoking Status: never (01/01/10)  Review of Systems      See HPI General:  Complains of chills and fever. ENT:  Complains of nasal congestion, postnasal drainage, sinus pressure, and sore throat. Resp:  Complains of cough and sputum productive; denies shortness of breath and wheezing.  Physical Exam  General:  Well-developed,well-nourished,in no acute distress; alert,appropriate and cooperative throughout examination VSS, non toxic appearing. Ears:  External ear exam shows no significant lesions or deformities.  Otoscopic examination reveals clear canals, tympanic membranes are intact bilaterally without bulging, retraction, inflammation or discharge. Hearing is grossly normal bilaterally. Nose:  boggy turbinates, sinuses +/- TTP Mouth:  pharyngeal erythema.   Lungs:  Normal respiratory effort, chest expands symmetrically. Lungs are clear to auscultation, no crackles or wheezes. Heart:  Normal rate and regular rhythm. S1 and S2 normal without gallop, murmur, click, rub or other extra sounds. Psych:  Cognition and judgment appear intact. Alert and cooperative with normal attention span and concentration. No apparent delusions, illusions, hallucinations   Impression & Recommendations:  Problem # 1:  OTHER ACUTE SINUSITIS (ICD-461.8) Assessment New Given duration and progression of symptoms, will treat with 10 day course of Augmentin. Continue supportive care with fluids, Tussin DM, mucinex. Contact our office if no improvement in 7- 10 days. Her updated medication list for this problem includes:    Augmentin 875-125 Mg  Tabs (Amoxicillin-pot clavulanate) .Marland Kitchen... Take one tablet by mouth two times a day for ten days  Complete Medication List: 1)  Aspirin 81 Mg Tabs (Aspirin) .... Take 1 tablet by mouth once a day 2)  Fish Oil Oil (Fish oil) .Marland Kitchen.. 1200 mg. capsules.  takes 3 by mouth once daily 3)  Calcium  Carbonate-vitamin D 600-400 Mg-unit Tabs (Calcium carbonate-vitamin d) .Marland Kitchen.. 1200 mg.  takes 2 tablets by mouth once daily 4)  Co Q-10 30 Mg Caps (Coenzyme q10) .Marland Kitchen.. 100 mg. takes 1 capsule by mouth once daily 5)  Milk Thistle 500 Mg Caps (Milk thistle) .Marland Kitchen.. 1000 mg. capsule   take 1 capsule by mouth once daily 6)  Alprazolam 0.25 Mg Tabs (Alprazolam) .Marland Kitchen.. 1 tab by mouth three times a day as needed anxiety 7)  Pravastatin Sodium 20 Mg Tabs (Pravastatin sodium) .... One by mouth at bedtime 8)  Augmentin 875-125 Mg Tabs (Amoxicillin-pot clavulanate) .... Take one tablet by mouth two times a day for ten days   Orders Added: 1)  Est. Patient Level III [13086]    Current Allergies (reviewed today): No known allergies

## 2011-03-04 ENCOUNTER — Encounter: Payer: Self-pay | Admitting: Family Medicine

## 2011-03-06 ENCOUNTER — Encounter: Payer: Self-pay | Admitting: Family Medicine

## 2011-03-06 ENCOUNTER — Ambulatory Visit (INDEPENDENT_AMBULATORY_CARE_PROVIDER_SITE_OTHER): Payer: 59 | Admitting: Family Medicine

## 2011-03-06 VITALS — BP 130/80 | HR 72 | Temp 98.7°F | Ht 66.0 in | Wt 185.4 lb

## 2011-03-06 DIAGNOSIS — R1032 Left lower quadrant pain: Secondary | ICD-10-CM

## 2011-03-06 LAB — POCT URINALYSIS DIPSTICK
Bilirubin, UA: 2
Blood, UA: NEGATIVE
Glucose, UA: NEGATIVE
Ketones, UA: NEGATIVE
Leukocytes, UA: NEGATIVE
Nitrite, UA: NEGATIVE
Spec Grav, UA: 1.015
Urobilinogen, UA: NEGATIVE
pH, UA: 6

## 2011-03-06 NOTE — Progress Notes (Signed)
Addended by: Linde Gillis on: 03/06/2011 03:56 PM   Modules accepted: Orders

## 2011-03-06 NOTE — Patient Instructions (Addendum)
   After you go to the lab, please stop by to see Shirlee Limerick on your way out.  Many things can cause belly (abdominal) pain. Most times, the belly pain is not dangerous. The amount of belly pain does not tell how serious the problem may be. Many cases of belly pain can be watched and treated at home. At this time, your doctor believes it is safe for you or your child to go home. HOME CARE  Do not take medicines called laxatives unless told to do so by your doctor. This medicine makes a person go poop (bowel movement).   Only use medicines as told by your doctor.   Limit activity.   Pay attention to the pain.   Has it changed?   Has it moved?   Is it gone?   Eat or drink as told by your doctor. Your doctor will tell you if you or your child should be on a special diet.   Ask questions if there is something you do not understand.  GET HELP RIGHT AWAY IF:  The pain does not go away.   The pain changes and is only in the right or left part of the belly.   You or your child has a temperature by mouth above 100.4, not controlled by medicine.   Your baby is older than 3 months with a rectal temperature of 102 F (38.9 C) or higher.   Your baby is 33 months old or younger with a rectal temperature of 100.4 F (38 C) or higher.   You or your child keeps throwing up (vomiting) or cannot keep food and fluids down.   There is blood (bright red color) in the poop.   The poop is black and/or looks like tar.  MAKE SURE YOU:  Understand these instructions.   Will watch this condition.   Will get help right away if you or your child is not doing well or gets worse.  Document Released: 05/11/2008 Document Re-Released: 02/17/2010 [facility] Patient Information 2011 [place], [place].

## 2011-03-06 NOTE — Assessment & Plan Note (Signed)
New.  Differential is wide. Not consistent with cholecystitis or appendicitis. UA neg for bacteria or blood. S/p complete hysterectomy/oopherectomy so pelvic pathology of less of concern. ?nephrolithiasis vs diverticulitis although not typical for either. Currently no red flag symptoms. Will check CBC, CMET and order Ultrasound of abdomen.

## 2011-03-06 NOTE — Progress Notes (Signed)
65 yo with acute onset two days ago of LLQ abdominal pain. Pain is sharp when you palpate the area, otherwise feels dull, achy. No nausea, vomiting, constipation, or changes in her bowel habits. No dysuria or increased urinary frequency.  Nothing else makes it better or worse. Never had anything like this before.  Colonoscopy UTD- 2008.  S/p complete hysterectomy/oophorectomy.   Review of Systems       See HPI General:  Denies fever and weakness. Eyes:  Denies blurring. ENT:  Denies difficulty swallowing. CV:  Denies chest pain or discomfort. Resp:  Denies shortness of breath. GI:  Denies abdominal pain and bloody stools. Derm:  Denies rash. Neuro:  Denies headaches and visual disturbances. Psych:  Denies anxiety and depression.  The PMH, PSH, Social History, Family History, Medications, and allergies have been reviewed in [facility], and have been updated if relevant.   Physical Exam BP 130/80  Pulse 72  Temp(Src) 98.7 F (37.1 C) (Oral)  Ht 5\' 6"  (1.676 m)  Wt 185 lb 6.4 oz (84.097 kg)  BMI 29.92 kg/m2  General:  Well-developed,well-nourished,in no acute distress; alert,appropriate and cooperative throughout examination Eyes:  No corneal or conjunctival inflammation noted. EOMI. Perrla. Funduscopic exam benign, without hemorrhages, exudates or papilledema. Vision grossly normal. Ears:  External ear exam shows no significant lesions or deformities.  Otoscopic examination reveals clear canals, tympanic membranes are intact bilaterally without bulging, retraction, inflammation or discharge. Hearing is grossly normal bilaterally. Mouth:  Oral mucosa and oropharynx without lesions or exudates.  Teeth in good repair. Abdomen:  Bowel sounds positive,abdomen soft, TTP over LLQ, no rebound or guarding. Extremities:  No clubbing, cyanosis, edema, or deformity noted with normal full range of motion of all joints.   Skin:  Intact without suspicious lesions or rashes Psych:  Cognition and  judgment appear intact. Alert and cooperative with normal attention span and concentration. No apparent delusions, illusions, hallucinations

## 2011-03-08 LAB — URINE CULTURE: Colony Count: >=100000

## 2011-03-10 ENCOUNTER — Ambulatory Visit
Admission: RE | Admit: 2011-03-10 | Discharge: 2011-03-10 | Disposition: A | Discharge status: Home / Self Care | Payer: 59 | Source: Ambulatory Visit | Attending: Family Medicine | Admitting: Family Medicine

## 2011-03-10 DIAGNOSIS — R1032 Left lower quadrant pain: Secondary | ICD-10-CM

## 2011-04-28 ENCOUNTER — Other Ambulatory Visit: Payer: Self-pay | Admitting: Family Medicine

## 2011-04-28 ENCOUNTER — Other Ambulatory Visit (INDEPENDENT_AMBULATORY_CARE_PROVIDER_SITE_OTHER): Payer: 59 | Admitting: Family Medicine

## 2011-04-28 DIAGNOSIS — E7800 Pure hypercholesterolemia, unspecified: Secondary | ICD-10-CM

## 2011-04-28 NOTE — Progress Notes (Signed)
Addended by: Liane Comber on: 04/28/2011 08:13 AM   Modules accepted: Orders

## 2011-04-28 NOTE — Progress Notes (Signed)
Addended by: CHAVERS, NATASHA on: 04/28/2011 08:13 AM   Modules accepted: Orders  

## 2011-05-05 ENCOUNTER — Ambulatory Visit: Payer: 59 | Admitting: Family Medicine

## 2011-05-07 ENCOUNTER — Encounter: Payer: Self-pay | Admitting: Family Medicine

## 2011-05-07 ENCOUNTER — Ambulatory Visit (INDEPENDENT_AMBULATORY_CARE_PROVIDER_SITE_OTHER): Payer: 59 | Admitting: Family Medicine

## 2011-05-07 VITALS — BP 130/70 | HR 82 | Temp 98.0°F | Ht 66.0 in | Wt 187.8 lb

## 2011-05-07 DIAGNOSIS — R7401 Elevation of levels of liver transaminase levels: Secondary | ICD-10-CM

## 2011-05-07 DIAGNOSIS — R1032 Left lower quadrant pain: Secondary | ICD-10-CM

## 2011-05-07 DIAGNOSIS — R7402 Elevation of levels of lactic acid dehydrogenase (LDH): Secondary | ICD-10-CM

## 2011-05-07 NOTE — Assessment & Plan Note (Signed)
Resolved with unclear etiology. Pt will let me know if symptoms reoccur.

## 2011-05-07 NOTE — Assessment & Plan Note (Signed)
Improved. Continue Pravachol as AST/ALT are less than 3 times upper limits of normal. Repeat labs in 3 months. Future order placed.

## 2011-05-07 NOTE — Progress Notes (Signed)
65 yo here to follow up lab work. Saw her on 3/30 for with acute onset  of LLQ abdominal pain.  Abdominal ultrasound was neg except for fatty liver.   UA was poss for multiple colonies, likely contaminant. CBC neg, BMET neg. Liver function elevated- AST 134, ALT 174, otherwise within normal limits. Repeat liver function this week shows that AST is improved to 78, ALT 111. Abdominal pain has resolved. Taking less NSAIDs now that pain has resolved. She continues to take Pravachol. She does admit to drinking 2-3 glasses of wine per evening.  Colonoscopy UTD- 2008.  S/p complete hysterectomy/oophorectomy.   Review of Systems       See HPI General:  Denies fever and weakness. Eyes:  Denies blurring. ENT:  Denies difficulty swallowing. CV:  Denies chest pain or discomfort. Resp:  Denies shortness of breath. GI:  Denies abdominal pain and bloody stools. Derm:  Denies rash. Neuro:  Denies headaches and visual disturbances. Psych:  Denies anxiety and depression.  The PMH, PSH, Social History, Family History, Medications, and allergies have been reviewed in [facility], and have been updated if relevant.   Physical Exam BP 130/70  Pulse 82  Temp(Src) 98 F (36.7 C) (Oral)  Ht 5\' 6"  (1.676 m)  Wt 187 lb 12.8 oz (85.186 kg)  BMI 30.31 kg/m2  General:  Well-developed,well-nourished,in no acute distress; alert,appropriate and cooperative throughout examination Eyes:  No corneal or conjunctival inflammation noted. EOMI. Perrla. Funduscopic exam benign, without hemorrhages, exudates or papilledema. Vision grossly normal. Ears:  External ear exam shows no significant lesions or deformities.  Otoscopic examination reveals clear canals, tympanic membranes are intact bilaterally without bulging, retraction, inflammation or discharge. Hearing is grossly normal bilaterally. Mouth:  Oral mucosa and oropharynx without lesions or exudates.  Teeth in good repair. Abdomen:  Bowel sounds positive,abdomen  soft, TTP over LLQ, no rebound or guarding. Extremities:  No clubbing, cyanosis, edema, or deformity noted with normal full range of motion of all joints.   Skin:  Intact without suspicious lesions or rashes Psych:  Cognition and judgment appear intact. Alert and cooperative with normal attention span and concentration. No apparent delusions, illusions, hallucinations

## 2011-05-08 ENCOUNTER — Encounter: Payer: Self-pay | Admitting: Family Medicine

## 2011-08-20 ENCOUNTER — Encounter: Payer: Self-pay | Admitting: Family Medicine

## 2011-08-20 ENCOUNTER — Encounter: Payer: Self-pay | Admitting: *Deleted

## 2011-08-20 ENCOUNTER — Other Ambulatory Visit: Payer: Self-pay

## 2011-08-20 DIAGNOSIS — E785 Hyperlipidemia, unspecified: Secondary | ICD-10-CM

## 2011-08-20 DIAGNOSIS — R7401 Elevation of levels of liver transaminase levels: Secondary | ICD-10-CM

## 2011-08-20 DIAGNOSIS — R7402 Elevation of levels of lactic acid dehydrogenase (LDH): Secondary | ICD-10-CM

## 2011-08-20 MED ORDER — PRAVASTATIN SODIUM 20 MG PO TABS: 20.0000 mg | ORAL_TABLET | Freq: Every day | ORAL | Status: DC

## 2011-08-20 NOTE — Telephone Encounter (Signed)
Darlene Delacruz spoke with pt and pt wants Dr Dayton Martes to call in Prav[facility]atin to [facility]. Pt was last seen by Dr Dayton Martes on 05/07/11 and was told to continue Pravachol as AST/ALT were less that 3 times upper limit of  Normal. Pt was to repeat labs in 3 months. I do not see where pt has had labs done and I do not see lab appt scheduled. Please advise.  Pt can be reached at [phone].

## 2011-08-20 NOTE — Telephone Encounter (Signed)
Rx sent to [facility], patient notified and labs were scheduled for 09/01/2011.

## 2011-08-20 NOTE — Telephone Encounter (Signed)
Ok to refill. Please ask her to come in for follow up labs at her convenience.

## 2011-09-01 ENCOUNTER — Other Ambulatory Visit (INDEPENDENT_AMBULATORY_CARE_PROVIDER_SITE_OTHER): Payer: 59

## 2011-09-01 DIAGNOSIS — R7402 Elevation of levels of lactic acid dehydrogenase (LDH): Secondary | ICD-10-CM

## 2011-09-01 DIAGNOSIS — R7401 Elevation of levels of liver transaminase levels: Secondary | ICD-10-CM

## 2011-09-01 DIAGNOSIS — E785 Hyperlipidemia, unspecified: Secondary | ICD-10-CM

## 2011-09-01 LAB — HEPATIC FUNCTION PANEL
ALT: 128 U/L — ABNORMAL HIGH (ref 0–35)
AST: 97 U/L — ABNORMAL HIGH (ref 0–37)
Albumin: 4.1 g/dL (ref 3.5–5.2)
Alkaline Phosphatase: 83 U/L (ref 39–117)
Bilirubin, Direct: 0 mg/dL (ref 0.0–0.3)
Total Bilirubin: 0.7 mg/dL (ref 0.3–1.2)
Total Protein: 7.6 g/dL (ref 6.0–8.3)

## 2011-09-01 LAB — LIPID PANEL
Cholesterol: 263 mg/dL — ABNORMAL HIGH (ref 0–200)
HDL: 69.4 mg/dL (ref >39.00)
Total CHOL/HDL Ratio: 4
Triglycerides: 65 mg/dL (ref 0.0–149.0)
VLDL: 13 mg/dL (ref 0.0–40.0)

## 2011-09-02 LAB — LDL CHOLESTEROL, DIRECT: Direct LDL: 195.6 mg/dL

## 2011-10-08 ENCOUNTER — Encounter (HOSPITAL_BASED_OUTPATIENT_CLINIC_OR_DEPARTMENT_OTHER): Payer: Self-pay | Admitting: *Deleted

## 2011-10-13 ENCOUNTER — Other Ambulatory Visit: Payer: Self-pay | Admitting: Orthopedic Surgery

## 2011-10-13 ENCOUNTER — Encounter (HOSPITAL_BASED_OUTPATIENT_CLINIC_OR_DEPARTMENT_OTHER): Payer: Self-pay | Admitting: Anesthesiology

## 2011-10-13 ENCOUNTER — Ambulatory Visit (HOSPITAL_BASED_OUTPATIENT_CLINIC_OR_DEPARTMENT_OTHER): Payer: Medicare Other | Admitting: Anesthesiology

## 2011-10-13 ENCOUNTER — Encounter (HOSPITAL_BASED_OUTPATIENT_CLINIC_OR_DEPARTMENT_OTHER)
Admission: RE | Disposition: A | Discharge status: Home / Self Care | Payer: Self-pay | Source: Ambulatory Visit | Attending: Orthopedic Surgery

## 2011-10-13 ENCOUNTER — Ambulatory Visit (HOSPITAL_BASED_OUTPATIENT_CLINIC_OR_DEPARTMENT_OTHER)
Admission: RE | Admit: 2011-10-13 | Discharge: 2011-10-13 | Disposition: A | Discharge status: Home / Self Care | Payer: Medicare Other | Source: Ambulatory Visit | Attending: Orthopedic Surgery | Admitting: Orthopedic Surgery

## 2011-10-13 ENCOUNTER — Encounter (HOSPITAL_BASED_OUTPATIENT_CLINIC_OR_DEPARTMENT_OTHER): Payer: Self-pay | Admitting: Orthopedic Surgery

## 2011-10-13 DIAGNOSIS — G5600 Carpal tunnel syndrome, unspecified upper limb: Secondary | ICD-10-CM | POA: Insufficient documentation

## 2011-10-13 DIAGNOSIS — M6740 Ganglion, unspecified site: Secondary | ICD-10-CM | POA: Insufficient documentation

## 2011-10-13 DIAGNOSIS — Z01812 Encounter for preprocedural laboratory examination: Secondary | ICD-10-CM | POA: Insufficient documentation

## 2011-10-13 HISTORY — DX: Unspecified osteoarthritis, unspecified site: M19.90

## 2011-10-13 HISTORY — PX: CARPAL TUNNEL RELEASE: SHX101

## 2011-10-13 HISTORY — DX: Dyskinesia of esophagus: K22.4

## 2011-10-13 HISTORY — PX: EAR CYST EXCISION: SHX22

## 2011-10-13 LAB — POCT HEMOGLOBIN-HEMACUE: Hemoglobin: 15 g/dL (ref 12.0–15.0)

## 2011-10-13 SURGERY — CYST REMOVAL
Anesthesia: Regional | Laterality: Right

## 2011-10-13 MED ORDER — ONDANSETRON HCL 4 MG/2ML IJ SOLN
INTRAMUSCULAR | Status: DC | PRN
  Administered 2011-10-13: 4 mg via INTRAVENOUS

## 2011-10-13 MED ORDER — MEPERIDINE HCL 25 MG/ML IJ SOLN: 6.2500 mg | INTRAMUSCULAR | Status: DC | PRN

## 2011-10-13 MED ORDER — PROPOFOL 10 MG/ML IV EMUL
INTRAVENOUS | Status: DC | PRN
  Administered 2011-10-13: 50 ug/kg/min via INTRAVENOUS

## 2011-10-13 MED ORDER — CEFAZOLIN SODIUM 1-5 GM-% IV SOLN
1.0000 g | INTRAVENOUS | Status: AC
  Administered 2011-10-13: 1 g via INTRAVENOUS

## 2011-10-13 MED ORDER — MIDAZOLAM HCL 5 MG/5ML IJ SOLN
INTRAMUSCULAR | Status: DC | PRN
  Administered 2011-10-13 (×2): 2 mg via INTRAVENOUS

## 2011-10-13 MED ORDER — LACTATED RINGERS IV SOLN
INTRAVENOUS | Status: DC
  Administered 2011-10-13: 11:00:00 via INTRAVENOUS

## 2011-10-13 MED ORDER — PROPOFOL 10 MG/ML IV EMUL
INTRAVENOUS | Status: DC | PRN
  Administered 2011-10-13: 20 mg via INTRAVENOUS

## 2011-10-13 MED ORDER — BUPIVACAINE HCL (PF) 0.25 % IJ SOLN
INTRAMUSCULAR | Status: DC | PRN
  Administered 2011-10-13: 8.5 mL

## 2011-10-13 MED ORDER — ONDANSETRON HCL 4 MG/2ML IJ SOLN: 4.0000 mg | Freq: Once | INTRAMUSCULAR | Status: DC | PRN

## 2011-10-13 MED ORDER — FENTANYL CITRATE 0.05 MG/ML IJ SOLN
INTRAMUSCULAR | Status: DC | PRN
  Administered 2011-10-13: 100 ug via INTRAVENOUS

## 2011-10-13 MED ORDER — HYDROMORPHONE HCL PF 1 MG/ML IJ SOLN: 0.2500 mg | INTRAMUSCULAR | Status: DC | PRN

## 2011-10-13 MED ORDER — HYDROCODONE-ACETAMINOPHEN 5-325 MG PO TABS
1.0000 | ORAL_TABLET | ORAL | Status: DC | PRN
  Administered 2011-10-13: 1 via ORAL

## 2011-10-13 SURGICAL SUPPLY — 52 items
BANDAGE COBAN STERILE 2 (GAUZE/BANDAGES/DRESSINGS) IMPLANT
BANDAGE GAUZE ELAST BULKY 4 IN (GAUZE/BANDAGES/DRESSINGS) ×2 IMPLANT
BLADE MINI RND TIP GREEN BEAV (BLADE) IMPLANT
BLADE SURG 15 STRL LF DISP TIS (BLADE) ×1 IMPLANT
BLADE SURG 15 STRL SS (BLADE) ×1
BNDG COHESIVE 1X5 TAN STRL LF (GAUZE/BANDAGES/DRESSINGS) ×2 IMPLANT
BNDG COHESIVE 3X5 TAN STRL LF (GAUZE/BANDAGES/DRESSINGS) ×2 IMPLANT
BNDG ESMARK 4X9 LF (GAUZE/BANDAGES/DRESSINGS) IMPLANT
CHLORAPREP W/TINT 26ML (MISCELLANEOUS) ×2 IMPLANT
CLOTH BEACON ORANGE TIMEOUT ST (SAFETY) ×2 IMPLANT
CORDS BIPOLAR (ELECTRODE) ×2 IMPLANT
COVER MAYO STAND STRL (DRAPES) ×2 IMPLANT
COVER TABLE BACK 60X90 (DRAPES) ×2 IMPLANT
CUFF TOURNIQUET SINGLE 18IN (TOURNIQUET CUFF) IMPLANT
DECANTER SPIKE VIAL GLASS SM (MISCELLANEOUS) IMPLANT
DRAIN PENROSE 1/2X12 LTX STRL (WOUND CARE) IMPLANT
DRAPE EXTREMITY T 121X128X90 (DRAPE) ×2 IMPLANT
DRAPE SURG 17X23 STRL (DRAPES) ×2 IMPLANT
DRSG KUZMA FLUFF (GAUZE/BANDAGES/DRESSINGS) ×2 IMPLANT
GAUZE XEROFORM 1X8 LF (GAUZE/BANDAGES/DRESSINGS) ×2 IMPLANT
GLOVE BIO SURGEON STRL SZ7.5 (GLOVE) ×2 IMPLANT
GLOVE BIOGEL PI IND STRL 8 (GLOVE) ×1 IMPLANT
GLOVE BIOGEL PI INDICATOR 8 (GLOVE) ×1
GLOVE SKINSENSE NS SZ7.0 (GLOVE) ×1
GLOVE SKINSENSE STRL SZ7.0 (GLOVE) ×1 IMPLANT
GLOVE SURG ORTHO 8.0 STRL STRW (GLOVE) ×2 IMPLANT
GOWN BRE IMP PREV XXLGXLNG (GOWN DISPOSABLE) ×4 IMPLANT
GOWN PREVENTION PLUS XLARGE (GOWN DISPOSABLE) ×2 IMPLANT
NEEDLE 27GAX1X1/2 (NEEDLE) ×2 IMPLANT
NS IRRIG 1000ML POUR BTL (IV SOLUTION) ×2 IMPLANT
PACK BASIN DAY SURGERY FS (CUSTOM PROCEDURE TRAY) ×2 IMPLANT
PAD CAST 3X4 CTTN HI CHSV (CAST SUPPLIES) ×1 IMPLANT
PADDING CAST ABS 3INX4YD NS (CAST SUPPLIES)
PADDING CAST ABS 4INX4YD NS (CAST SUPPLIES) ×1
PADDING CAST ABS COTTON 3X4 (CAST SUPPLIES) IMPLANT
PADDING CAST ABS COTTON 4X4 ST (CAST SUPPLIES) ×1 IMPLANT
PADDING CAST COTTON 3X4 STRL (CAST SUPPLIES) ×1
SPLINT FNGR PLAIN END 5/8X3.25 (CAST SUPPLIES) ×1 IMPLANT
SPLINT PLASTALUME 3 1/4 (CAST SUPPLIES) ×2
SPLINT PLASTER CAST XFAST 3X15 (CAST SUPPLIES) IMPLANT
SPLINT PLASTER XTRA FASTSET 3X (CAST SUPPLIES)
SPONGE GAUZE 4X4 12PLY (GAUZE/BANDAGES/DRESSINGS) ×2 IMPLANT
STOCKINETTE 4X48 STRL (DRAPES) ×2 IMPLANT
SUT VIC AB 4-0 P2 18 (SUTURE) IMPLANT
SUT VICRYL 4-0 PS2 18IN ABS (SUTURE) IMPLANT
SUT VICRYL RAPID 5 0 P 3 (SUTURE) IMPLANT
SUT VICRYL RAPIDE 4/0 PS 2 (SUTURE) ×2 IMPLANT
SYR BULB 3OZ (MISCELLANEOUS) ×2 IMPLANT
SYR CONTROL 10ML LL (SYRINGE) ×2 IMPLANT
TOWEL OR 17X24 6PK STRL BLUE (TOWEL DISPOSABLE) ×4 IMPLANT
UNDERPAD 30X30 INCONTINENT (UNDERPADS AND DIAPERS) ×2 IMPLANT
WATER STERILE IRR 1000ML POUR (IV SOLUTION) ×2 IMPLANT

## 2011-10-13 NOTE — Progress Notes (Signed)
EKG waived by Dr Crews 

## 2011-10-13 NOTE — Anesthesia Procedure Notes (Signed)
Regional Block:   Narrative:

## 2011-10-13 NOTE — H&P (Signed)
  Darlene Delacruz is an 65 y.o. female.   Chief Complaint: mass right middle finger And numbness and tingling right thumb thru ring fingers. 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 She has carpal tunnel with positive nerve conductions Rt.  Past Medical History  Diagnosis Date  . Hyperlipidemia   . Arthritis   . Spastic esophagus     Past Surgical History  Procedure Date  . Abdominal hysterectomy     menorrhagia  . Bunionectomy     both feet  . Left knee arthroscopy   . Right rotator cuff repair     Family History  Problem Relation Age of Onset  . Heart failure Mother    Social History:  reports that she quit smoking about 20 years ago. She does not have any smokeless tobacco history on file. She reports that she does not drink alcohol or use illicit drugs.  Allergies: No Known Allergies  Medications Prior to Admission  Medication Dose Route Frequency Provider Last Rate Last Dose  . ceFAZolin (ANCEF) IVPB 1 g/50 mL premix  1 g Intravenous 60 min Pre-Op Nicki Reaper, MD      . lactated ringers infusion   Intravenous Continuous Constance Goltz, MD 10 mL/hr at 10/13/11 1036     Medications Prior to Admission  Medication Sig Dispense Refill  . aspirin 81 MG tablet Take 81 mg by mouth daily.       . Milk Thistle 500 MG CAPS Take 2 capsules by mouth daily.       . Omega-3 Fatty Acids (FISH OIL) 1200 MG CAPS Take 4 capsules by mouth 2 (two) times daily.       . pravastatin (PRAVACHOL) 20 MG tablet Take 1 tablet (20 mg total) by mouth at bedtime.  90 tablet  3    No results found for this or any previous visit (from the past 48 hour(s)).  No results found.   A comprehensive review of systems was negative.  Blood pressure 139/86, pulse 73, temperature 97.7 F (36.5 C), temperature source Oral, resp. rate 16, SpO2 97.00%.  General appearance: alert, cooperative and appears stated age Head: Normocephalic, without obvious abnormality Neck: no  adenopathy Resp: clear to auscultation bilaterally Cardio: regular rate and rhythm, S1, S2 normal, no murmur, click, rub or gallop GI: soft, non-tender; bowel sounds normal; no masses,  no organomegaly Extremities: extremities normal, atraumatic, no cyanosis or edema Pulses: 2+ and symmetric Skin: Skin color, texture, turgor normal. No rashes or lesions Neurologic: Grossly normal Incision/Wound: na  Assessment/Plan Plan excision mucoid cyst with debridement of DIP and CTR  Adlynn Lowenstein R 10/13/2011, 10:53 AM

## 2011-10-13 NOTE — Op Note (Signed)
PROCEDURE:  The patient was brought to the operating room where a forearm-based IV regional anesthetic was carried out without difficulty. He was prepped using ChloraPrep, supine position, right arm free.  A 3- minutes dry time was allowed.  A time-out was taken confirming the patient and procedure.  Curvilinear incision was made over the distal interphalangeal right middle finger  joint and carried down through the subcutaneous tissue. Bleeders were electrocauterized with bipolar.  Dissection was carried down to the joint.  This was opened, debrided.  The mass was isolated distally in that this was protruding through the skin.  With blunt and sharp dissection, this was dissected free and removed and sent to Pathology.  No further lesions were identified.  The wound was irrigated.  The skin was closed with interrupted 5-0 Vicryl Rapide sutures.  A metacarpal block was then given with 0.25% Marcaine without epinephrine, approximately 6 mL was used.  A longitudinal incision was made in the palm, carried down through subcutaneous tissue.  Bleeders were electrocauterized.  Palmar fascia was split.  Superficial palmar arch identified.  The flexor tendon to the ring little finger identified to the ulnar side of the median nerve.  The carpal retinaculum was incised with sharp dissection.  A right-angle and Sewall retractor were placed between skin and forearm fascia.  The fascia released for approximately a cm and half proximal to the wrist crease under direct vision.  Canal was explored.  No further lesions were identified.  The wound was irrigated and closed with interrupted 4-0 Vicryl repeat sutures. A local infiltration of Marcaine was given 5 cc was used.     A sterile compressive dressing and splint to the finger was applied.   On deflation of th etourniquet, all fingers immediately pinked.  He was taken to the recovery room for observation in satisfactory condition.  He will be discharged  home to return to [facility] [place] in 1 week on Vicodin.

## 2011-10-13 NOTE — Transfer of Care (Signed)
Immediate Anesthesia Transfer of Care Note  Patient: Darlene Delacruz  Procedure(s) Performed:  CYST REMOVAL - excision cyst possible rotator flap, debridement dip right middle finger; CARPAL TUNNEL RELEASE  Patient Location: PACU  Anesthesia Type: Bier block  Level of Consciousness: awake and patient cooperative  Airway & Oxygen Therapy: Patient Spontanous Breathing and Patient connected to face mask oxygen  Post-op Assessment: Report given to PACU RN and Post -op Vital signs reviewed and stable  Post vital signs: Reviewed and stable  Complications: No apparent anesthesia complications

## 2011-10-13 NOTE — Anesthesia Postprocedure Evaluation (Signed)
  Anesthesia Post-op Note  Patient: Darlene Delacruz  Procedure(s) Performed:  CYST REMOVAL - excision cyst possible rotator flap, debridement dip right middle finger; CARPAL TUNNEL RELEASE  Patient Location: PACU  Anesthesia Type: MAC  Level of Consciousness: awake, alert  and oriented  Airway and Oxygen Therapy: Patient Spontanous Breathing  Post-op Pain: mild  Post-op Assessment: Post-op Vital signs reviewed, Patient's Cardiovascular Status Stable, Respiratory Function Stable, Patent Airway and No signs of Nausea or vomiting  Post-op Vital Signs: Reviewed and stable  Complications: No apparent anesthesia complications

## 2011-10-13 NOTE — Anesthesia Preprocedure Evaluation (Addendum)
Anesthesia Evaluation  Patient identified by MRN, date of birth, ID band Patient awake    Reviewed: Allergy & Precautions, H&P , NPO status , Patient's Chart, lab work & pertinent test results  Airway Mallampati: I TM Distance: >3 FB Neck ROM: Full    Dental No notable dental hx. (+) Teeth Intact and Dental Advisory Given   Pulmonary neg pulmonary ROS,    Pulmonary exam normal       Cardiovascular     Neuro/Psych PSYCHIATRIC DISORDERS  Neuromuscular disease Negative Neurological ROS     GI/Hepatic negative GI ROS, Neg liver ROS,   Endo/Other  Negative Endocrine ROS  Renal/GU negative Renal ROS     Musculoskeletal   Abdominal   Peds  Hematology negative hematology ROS (+)   Anesthesia Other Findings   Reproductive/Obstetrics                         Anesthesia Physical Anesthesia Plan  ASA: II  Anesthesia Plan: Bier Block   Post-op Pain Management:    Induction: Intravenous  Airway Management Planned: Simple Face Mask  Additional Equipment:   Intra-op Plan:   Post-operative Plan:   Informed Consent: I have reviewed the patients History and Physical, chart, labs and discussed the procedure including the risks, benefits and alternatives for the proposed anesthesia with the patient or authorized representative who has indicated his/her understanding and acceptance.   Dental advisory given  Plan Discussed with: CRNA and Surgeon  Anesthesia Plan Comments:         Anesthesia Quick Evaluation

## 2011-10-13 NOTE — Brief Op Note (Signed)
10/13/2011  11:56 AM  PATIENT:  Darlene Delacruz  65 y.o. female  PRE-OPERATIVE DIAGNOSIS:  mucoid tumor right middle, right cts  POST-OPERATIVE DIAGNOSIS:  mucoid tumor right middle, right cts  PROCEDURE:  Procedure(s): CYST REMOVAL debridement distal interphalangeal joint RMF CARPAL TUNNEL RELEASE  SURGEON:  Surgeon(s): Nicki Reaper, MD Tami Ribas  PHYSICIAN ASSISTANT:   ASSISTANTS: Karlyn Agee, MD   ANESTHESIA:   local and regional  EBL:  Total I/O In: 600 [I.V.:600] Out: -   DRAINS: none   LOCAL MEDICATIONS USED:  MARCAINE 9CC  SPECIMEN:  Excision  DISPOSITION OF SPECIMEN:  PATHOLOGY  COUNTS:  YES  TOURNIQUET:  * Missing tourniquet times found for documented tourniquets in log:  1610 *  DICTATION: PROCEDURE:  The patient was brought to the operating room where a forearm-based IV regional anesthetic was carried out without difficulty. He was prepped using ChloraPrep, supine position, right arm free.  A 3- minutes dry time was allowed.  A time-out was taken confirming the patient and procedure.  Curvilinear incision was made over the distal interphalangeal right middle finger  joint and carried down through the subcutaneous tissue. Bleeders were electrocauterized with bipolar.  Dissection was carried down to the joint.  This was opened, debrided.  The mass was isolated distally in that this was protruding through the skin.  With blunt and sharp dissection, this was dissected free and removed and sent to Pathology.  No further lesions were identified.  The wound was irrigated.  The skin was closed with interrupted 5-0 Vicryl Rapide sutures.  A metacarpal block was then given with 0.25% Marcaine without epinephrine, approximately 6 mL was used.  A longitudinal incision was made in the palm, carried down through subcutaneous tissue.  Bleeders were electrocauterized.  Palmar fascia was split.  Superficial palmar arch identified.  The flexor tendon to the ring little  finger identified to the ulnar side of the median nerve.  The carpal retinaculum was incised with sharp dissection.  A right-angle and Sewall retractor were placed between skin and forearm fascia.  The fascia released for approximately a cm and half proximal to the wrist crease under direct vision.  Canal was explored.  No further lesions were identified.  The wound was irrigated and closed with interrupted 4-0 Vicryl repeat sutures. A local infiltration of Marcaine was given 5 cc was used.     A sterile compressive dressing and splint to the finger was applied.   On deflation of th etourniquet, all fingers immediately pinked.  He was taken to the recovery room for observation in satisfactory condition.  He will be discharged home to return to [facility] [place] in 1 week on Vicodin.Marland Kitchen  PLAN OF CARE: Discharge to home after PACU    Delay start of Pharmacological VTE agent (>24hrs) due to surgical blood loss or risk of bleeding:  not applicable              Darlene Delacruz is a 64 y.o. female patient.  No diagnosis found. Past Medical History  Diagnosis Date  . Hyperlipidemia   . Arthritis   . Spastic esophagus    No past surgical history pertinent negatives on file. Scheduled Meds:   . ceFAZolin (ANCEF) IV  1 g Intravenous 60 min Pre-Op   Continuous Infusions:   . lactated ringers 10 mL/hr at 10/13/11 1036   PRN Meds:bupivacaine  No Known Allergies Active Problems:  * No active hospital problems. *   Blood  pressure 139/86, pulse 73, temperature 97.7 F (36.5 C), temperature source Oral, resp. rate 16, SpO2 97.00%.  @IPPOSUB @ @IPPOOBJ @ @IPPOAP @  Darlene Delacruz R 10/13/2011

## 2011-10-19 ENCOUNTER — Encounter (HOSPITAL_BASED_OUTPATIENT_CLINIC_OR_DEPARTMENT_OTHER): Payer: Self-pay | Admitting: Orthopedic Surgery

## 2012-07-26 ENCOUNTER — Other Ambulatory Visit: Payer: Self-pay | Admitting: *Deleted

## 2012-07-26 ENCOUNTER — Encounter (INDEPENDENT_AMBULATORY_CARE_PROVIDER_SITE_OTHER): Payer: 59

## 2012-07-26 DIAGNOSIS — M79609 Pain in unspecified limb: Secondary | ICD-10-CM

## 2012-07-26 DIAGNOSIS — M75120 Complete rotator cuff tear or rupture of unspecified shoulder, not specified as traumatic: Secondary | ICD-10-CM | POA: Diagnosis not present

## 2012-07-26 DIAGNOSIS — M1710 Unilateral primary osteoarthritis, unspecified knee: Secondary | ICD-10-CM | POA: Diagnosis not present

## 2012-08-02 DIAGNOSIS — IMO0002 Reserved for concepts with insufficient information to code with codable children: Secondary | ICD-10-CM | POA: Diagnosis not present

## 2012-08-11 DIAGNOSIS — E785 Hyperlipidemia, unspecified: Secondary | ICD-10-CM | POA: Diagnosis not present

## 2012-08-11 DIAGNOSIS — K219 Gastro-esophageal reflux disease without esophagitis: Secondary | ICD-10-CM | POA: Diagnosis not present

## 2012-09-08 DIAGNOSIS — Z1231 Encounter for screening mammogram for malignant neoplasm of breast: Secondary | ICD-10-CM | POA: Diagnosis not present

## 2012-09-13 DIAGNOSIS — Z23 Encounter for immunization: Secondary | ICD-10-CM | POA: Diagnosis not present

## 2012-09-13 DIAGNOSIS — Z0000 Encounter for general adult medical examination without abnormal findings: Secondary | ICD-10-CM | POA: Diagnosis not present

## 2012-09-13 DIAGNOSIS — E785 Hyperlipidemia, unspecified: Secondary | ICD-10-CM | POA: Diagnosis not present

## 2012-09-13 DIAGNOSIS — E669 Obesity, unspecified: Secondary | ICD-10-CM | POA: Diagnosis not present

## 2012-09-23 DIAGNOSIS — R030 Elevated blood-pressure reading, without diagnosis of hypertension: Secondary | ICD-10-CM | POA: Diagnosis not present

## 2012-09-23 DIAGNOSIS — E785 Hyperlipidemia, unspecified: Secondary | ICD-10-CM | POA: Diagnosis not present

## 2012-09-23 DIAGNOSIS — K219 Gastro-esophageal reflux disease without esophagitis: Secondary | ICD-10-CM | POA: Diagnosis not present

## 2012-10-04 DIAGNOSIS — Z1211 Encounter for screening for malignant neoplasm of colon: Secondary | ICD-10-CM | POA: Diagnosis not present

## 2012-10-04 DIAGNOSIS — K5730 Diverticulosis of large intestine without perforation or abscess without bleeding: Secondary | ICD-10-CM | POA: Diagnosis not present

## 2012-10-04 DIAGNOSIS — Z86010 Personal history of colonic polyps: Secondary | ICD-10-CM | POA: Diagnosis not present

## 2012-10-04 DIAGNOSIS — K449 Diaphragmatic hernia without obstruction or gangrene: Secondary | ICD-10-CM | POA: Diagnosis not present

## 2012-11-25 DIAGNOSIS — D126 Benign neoplasm of colon, unspecified: Secondary | ICD-10-CM | POA: Diagnosis not present

## 2012-11-25 DIAGNOSIS — Z1211 Encounter for screening for malignant neoplasm of colon: Secondary | ICD-10-CM | POA: Diagnosis not present

## 2012-11-25 DIAGNOSIS — K5730 Diverticulosis of large intestine without perforation or abscess without bleeding: Secondary | ICD-10-CM | POA: Diagnosis not present

## 2012-11-25 DIAGNOSIS — Z86010 Personal history of colonic polyps: Secondary | ICD-10-CM | POA: Diagnosis not present

## 2012-12-20 DIAGNOSIS — E785 Hyperlipidemia, unspecified: Secondary | ICD-10-CM | POA: Diagnosis not present

## 2012-12-27 DIAGNOSIS — R7401 Elevation of levels of liver transaminase levels: Secondary | ICD-10-CM | POA: Diagnosis not present

## 2012-12-27 DIAGNOSIS — Z23 Encounter for immunization: Secondary | ICD-10-CM | POA: Diagnosis not present

## 2012-12-27 DIAGNOSIS — K219 Gastro-esophageal reflux disease without esophagitis: Secondary | ICD-10-CM | POA: Diagnosis not present

## 2012-12-27 DIAGNOSIS — R7402 Elevation of levels of lactic acid dehydrogenase (LDH): Secondary | ICD-10-CM | POA: Diagnosis not present

## 2012-12-27 DIAGNOSIS — E785 Hyperlipidemia, unspecified: Secondary | ICD-10-CM | POA: Diagnosis not present

## 2013-03-10 DIAGNOSIS — N390 Urinary tract infection, site not specified: Secondary | ICD-10-CM | POA: Diagnosis not present

## 2013-03-10 DIAGNOSIS — E785 Hyperlipidemia, unspecified: Secondary | ICD-10-CM | POA: Diagnosis not present

## 2013-03-10 DIAGNOSIS — R3989 Other symptoms and signs involving the genitourinary system: Secondary | ICD-10-CM | POA: Diagnosis not present

## 2013-03-16 DIAGNOSIS — R3989 Other symptoms and signs involving the genitourinary system: Secondary | ICD-10-CM | POA: Diagnosis not present

## 2013-03-20 DIAGNOSIS — IMO0002 Reserved for concepts with insufficient information to code with codable children: Secondary | ICD-10-CM | POA: Diagnosis not present

## 2013-03-21 DIAGNOSIS — E785 Hyperlipidemia, unspecified: Secondary | ICD-10-CM | POA: Diagnosis not present

## 2013-03-28 DIAGNOSIS — R7401 Elevation of levels of liver transaminase levels: Secondary | ICD-10-CM | POA: Diagnosis not present

## 2013-03-28 DIAGNOSIS — K219 Gastro-esophageal reflux disease without esophagitis: Secondary | ICD-10-CM | POA: Diagnosis not present

## 2013-03-28 DIAGNOSIS — E785 Hyperlipidemia, unspecified: Secondary | ICD-10-CM | POA: Diagnosis not present

## 2013-03-28 DIAGNOSIS — R7402 Elevation of levels of lactic acid dehydrogenase (LDH): Secondary | ICD-10-CM | POA: Diagnosis not present

## 2013-09-26 DIAGNOSIS — Z1331 Encounter for screening for depression: Secondary | ICD-10-CM | POA: Diagnosis not present

## 2013-09-26 DIAGNOSIS — E785 Hyperlipidemia, unspecified: Secondary | ICD-10-CM | POA: Diagnosis not present

## 2013-09-26 DIAGNOSIS — Z23 Encounter for immunization: Secondary | ICD-10-CM | POA: Diagnosis not present

## 2013-09-26 DIAGNOSIS — Z0000 Encounter for general adult medical examination without abnormal findings: Secondary | ICD-10-CM | POA: Diagnosis not present

## 2013-10-03 DIAGNOSIS — K219 Gastro-esophageal reflux disease without esophagitis: Secondary | ICD-10-CM | POA: Diagnosis not present

## 2013-10-03 DIAGNOSIS — R7402 Elevation of levels of lactic acid dehydrogenase (LDH): Secondary | ICD-10-CM | POA: Diagnosis not present

## 2013-10-03 DIAGNOSIS — E785 Hyperlipidemia, unspecified: Secondary | ICD-10-CM | POA: Diagnosis not present

## 2013-10-03 DIAGNOSIS — R7401 Elevation of levels of liver transaminase levels: Secondary | ICD-10-CM | POA: Diagnosis not present

## 2013-11-16 DIAGNOSIS — IMO0002 Reserved for concepts with insufficient information to code with codable children: Secondary | ICD-10-CM | POA: Diagnosis not present

## 2013-11-16 DIAGNOSIS — M19079 Primary osteoarthritis, unspecified ankle and foot: Secondary | ICD-10-CM | POA: Diagnosis not present

## 2013-11-21 DIAGNOSIS — M1710 Unilateral primary osteoarthritis, unspecified knee: Secondary | ICD-10-CM | POA: Diagnosis not present

## 2013-11-23 DIAGNOSIS — M1710 Unilateral primary osteoarthritis, unspecified knee: Secondary | ICD-10-CM | POA: Diagnosis not present

## 2013-12-29 DIAGNOSIS — M9420 Chondromalacia, unspecified site: Secondary | ICD-10-CM | POA: Diagnosis not present

## 2013-12-29 DIAGNOSIS — M23302 Other meniscus derangements, unspecified lateral meniscus, unspecified knee: Secondary | ICD-10-CM | POA: Diagnosis not present

## 2013-12-29 DIAGNOSIS — G8918 Other acute postprocedural pain: Secondary | ICD-10-CM | POA: Diagnosis not present

## 2013-12-29 DIAGNOSIS — M23305 Other meniscus derangements, unspecified medial meniscus, unspecified knee: Secondary | ICD-10-CM | POA: Diagnosis not present

## 2013-12-29 DIAGNOSIS — S83289A Other tear of lateral meniscus, current injury, unspecified knee, initial encounter: Secondary | ICD-10-CM | POA: Diagnosis not present

## 2013-12-29 DIAGNOSIS — M2240 Chondromalacia patellae, unspecified knee: Secondary | ICD-10-CM | POA: Diagnosis not present

## 2013-12-29 DIAGNOSIS — IMO0002 Reserved for concepts with insufficient information to code with codable children: Secondary | ICD-10-CM | POA: Diagnosis not present

## 2014-01-08 ENCOUNTER — Other Ambulatory Visit (HOSPITAL_COMMUNITY): Payer: Self-pay | Admitting: Cardiology

## 2014-01-08 ENCOUNTER — Ambulatory Visit (HOSPITAL_COMMUNITY): Payer: Medicare Other | Attending: Orthopedic Surgery

## 2014-01-08 DIAGNOSIS — E785 Hyperlipidemia, unspecified: Secondary | ICD-10-CM | POA: Insufficient documentation

## 2014-01-08 DIAGNOSIS — I10 Essential (primary) hypertension: Secondary | ICD-10-CM | POA: Diagnosis not present

## 2014-01-08 DIAGNOSIS — M79609 Pain in unspecified limb: Secondary | ICD-10-CM

## 2014-01-08 DIAGNOSIS — M7989 Other specified soft tissue disorders: Secondary | ICD-10-CM

## 2014-01-08 DIAGNOSIS — Z87891 Personal history of nicotine dependence: Secondary | ICD-10-CM | POA: Insufficient documentation

## 2014-02-22 DIAGNOSIS — M25569 Pain in unspecified knee: Secondary | ICD-10-CM | POA: Diagnosis not present

## 2014-02-27 DIAGNOSIS — M6281 Muscle weakness (generalized): Secondary | ICD-10-CM | POA: Diagnosis not present

## 2014-02-27 DIAGNOSIS — M25569 Pain in unspecified knee: Secondary | ICD-10-CM | POA: Diagnosis not present

## 2014-02-27 DIAGNOSIS — IMO0002 Reserved for concepts with insufficient information to code with codable children: Secondary | ICD-10-CM | POA: Diagnosis not present

## 2014-02-27 DIAGNOSIS — S83289A Other tear of lateral meniscus, current injury, unspecified knee, initial encounter: Secondary | ICD-10-CM | POA: Diagnosis not present

## 2014-03-01 DIAGNOSIS — S83289A Other tear of lateral meniscus, current injury, unspecified knee, initial encounter: Secondary | ICD-10-CM | POA: Diagnosis not present

## 2014-03-01 DIAGNOSIS — M25569 Pain in unspecified knee: Secondary | ICD-10-CM | POA: Diagnosis not present

## 2014-03-01 DIAGNOSIS — IMO0002 Reserved for concepts with insufficient information to code with codable children: Secondary | ICD-10-CM | POA: Diagnosis not present

## 2014-03-01 DIAGNOSIS — M6281 Muscle weakness (generalized): Secondary | ICD-10-CM | POA: Diagnosis not present

## 2014-03-06 DIAGNOSIS — M25569 Pain in unspecified knee: Secondary | ICD-10-CM | POA: Diagnosis not present

## 2014-03-06 DIAGNOSIS — S83289A Other tear of lateral meniscus, current injury, unspecified knee, initial encounter: Secondary | ICD-10-CM | POA: Diagnosis not present

## 2014-03-06 DIAGNOSIS — IMO0002 Reserved for concepts with insufficient information to code with codable children: Secondary | ICD-10-CM | POA: Diagnosis not present

## 2014-03-08 DIAGNOSIS — M25569 Pain in unspecified knee: Secondary | ICD-10-CM | POA: Diagnosis not present

## 2014-03-08 DIAGNOSIS — S83289A Other tear of lateral meniscus, current injury, unspecified knee, initial encounter: Secondary | ICD-10-CM | POA: Diagnosis not present

## 2014-03-08 DIAGNOSIS — IMO0002 Reserved for concepts with insufficient information to code with codable children: Secondary | ICD-10-CM | POA: Diagnosis not present

## 2014-03-12 DIAGNOSIS — IMO0002 Reserved for concepts with insufficient information to code with codable children: Secondary | ICD-10-CM | POA: Diagnosis not present

## 2014-03-12 DIAGNOSIS — M25569 Pain in unspecified knee: Secondary | ICD-10-CM | POA: Diagnosis not present

## 2014-03-12 DIAGNOSIS — S83289A Other tear of lateral meniscus, current injury, unspecified knee, initial encounter: Secondary | ICD-10-CM | POA: Diagnosis not present

## 2014-03-15 DIAGNOSIS — S83289A Other tear of lateral meniscus, current injury, unspecified knee, initial encounter: Secondary | ICD-10-CM | POA: Diagnosis not present

## 2014-03-15 DIAGNOSIS — M25569 Pain in unspecified knee: Secondary | ICD-10-CM | POA: Diagnosis not present

## 2014-03-15 DIAGNOSIS — IMO0002 Reserved for concepts with insufficient information to code with codable children: Secondary | ICD-10-CM | POA: Diagnosis not present

## 2014-03-16 DIAGNOSIS — Z1231 Encounter for screening mammogram for malignant neoplasm of breast: Secondary | ICD-10-CM | POA: Diagnosis not present

## 2014-03-19 DIAGNOSIS — IMO0002 Reserved for concepts with insufficient information to code with codable children: Secondary | ICD-10-CM | POA: Diagnosis not present

## 2014-03-19 DIAGNOSIS — M25569 Pain in unspecified knee: Secondary | ICD-10-CM | POA: Diagnosis not present

## 2014-03-19 DIAGNOSIS — S83289A Other tear of lateral meniscus, current injury, unspecified knee, initial encounter: Secondary | ICD-10-CM | POA: Diagnosis not present

## 2014-03-22 DIAGNOSIS — S83289A Other tear of lateral meniscus, current injury, unspecified knee, initial encounter: Secondary | ICD-10-CM | POA: Diagnosis not present

## 2014-03-22 DIAGNOSIS — IMO0002 Reserved for concepts with insufficient information to code with codable children: Secondary | ICD-10-CM | POA: Diagnosis not present

## 2014-03-22 DIAGNOSIS — M25569 Pain in unspecified knee: Secondary | ICD-10-CM | POA: Diagnosis not present

## 2014-03-27 DIAGNOSIS — M25569 Pain in unspecified knee: Secondary | ICD-10-CM | POA: Diagnosis not present

## 2014-03-27 DIAGNOSIS — IMO0002 Reserved for concepts with insufficient information to code with codable children: Secondary | ICD-10-CM | POA: Diagnosis not present

## 2014-03-27 DIAGNOSIS — S83289A Other tear of lateral meniscus, current injury, unspecified knee, initial encounter: Secondary | ICD-10-CM | POA: Diagnosis not present

## 2014-03-27 DIAGNOSIS — E785 Hyperlipidemia, unspecified: Secondary | ICD-10-CM | POA: Diagnosis not present

## 2014-03-27 DIAGNOSIS — K219 Gastro-esophageal reflux disease without esophagitis: Secondary | ICD-10-CM | POA: Diagnosis not present

## 2014-04-03 DIAGNOSIS — K219 Gastro-esophageal reflux disease without esophagitis: Secondary | ICD-10-CM | POA: Diagnosis not present

## 2014-04-03 DIAGNOSIS — R7401 Elevation of levels of liver transaminase levels: Secondary | ICD-10-CM | POA: Diagnosis not present

## 2014-04-03 DIAGNOSIS — R7402 Elevation of levels of lactic acid dehydrogenase (LDH): Secondary | ICD-10-CM | POA: Diagnosis not present

## 2014-04-03 DIAGNOSIS — E785 Hyperlipidemia, unspecified: Secondary | ICD-10-CM | POA: Diagnosis not present

## 2014-04-04 ENCOUNTER — Other Ambulatory Visit: Payer: Self-pay | Admitting: Internal Medicine

## 2014-04-04 DIAGNOSIS — R740 Nonspecific elevation of levels of transaminase and lactic acid dehydrogenase [LDH]: Principal | ICD-10-CM

## 2014-04-04 DIAGNOSIS — R7402 Elevation of levels of lactic acid dehydrogenase (LDH): Secondary | ICD-10-CM

## 2014-04-04 DIAGNOSIS — R7401 Elevation of levels of liver transaminase levels: Secondary | ICD-10-CM

## 2014-04-10 ENCOUNTER — Ambulatory Visit
Admission: RE | Admit: 2014-04-10 | Discharge: 2014-04-10 | Disposition: A | Discharge status: Home / Self Care | Payer: PRIVATE HEALTH INSURANCE | Source: Ambulatory Visit | Attending: Internal Medicine | Admitting: Internal Medicine

## 2014-04-10 DIAGNOSIS — R7989 Other specified abnormal findings of blood chemistry: Secondary | ICD-10-CM | POA: Diagnosis not present

## 2014-04-10 DIAGNOSIS — R7402 Elevation of levels of lactic acid dehydrogenase (LDH): Secondary | ICD-10-CM

## 2014-04-10 DIAGNOSIS — R7401 Elevation of levels of liver transaminase levels: Secondary | ICD-10-CM

## 2014-04-10 DIAGNOSIS — R740 Nonspecific elevation of levels of transaminase and lactic acid dehydrogenase [LDH]: Principal | ICD-10-CM

## 2014-06-15 DIAGNOSIS — L821 Other seborrheic keratosis: Secondary | ICD-10-CM | POA: Diagnosis not present

## 2014-06-15 DIAGNOSIS — D239 Other benign neoplasm of skin, unspecified: Secondary | ICD-10-CM | POA: Diagnosis not present

## 2014-06-15 DIAGNOSIS — D1801 Hemangioma of skin and subcutaneous tissue: Secondary | ICD-10-CM | POA: Diagnosis not present

## 2014-09-27 DIAGNOSIS — Z1389 Encounter for screening for other disorder: Secondary | ICD-10-CM | POA: Diagnosis not present

## 2014-09-27 DIAGNOSIS — E784 Other hyperlipidemia: Secondary | ICD-10-CM | POA: Diagnosis not present

## 2014-09-27 DIAGNOSIS — Z0000 Encounter for general adult medical examination without abnormal findings: Secondary | ICD-10-CM | POA: Diagnosis not present

## 2014-09-27 DIAGNOSIS — E6609 Other obesity due to excess calories: Secondary | ICD-10-CM | POA: Diagnosis not present

## 2014-10-04 DIAGNOSIS — E785 Hyperlipidemia, unspecified: Secondary | ICD-10-CM | POA: Diagnosis not present

## 2014-10-04 DIAGNOSIS — K760 Fatty (change of) liver, not elsewhere classified: Secondary | ICD-10-CM | POA: Diagnosis not present

## 2014-10-04 DIAGNOSIS — K219 Gastro-esophageal reflux disease without esophagitis: Secondary | ICD-10-CM | POA: Diagnosis not present

## 2015-03-28 DIAGNOSIS — E785 Hyperlipidemia, unspecified: Secondary | ICD-10-CM | POA: Diagnosis not present

## 2015-04-04 DIAGNOSIS — Z1231 Encounter for screening mammogram for malignant neoplasm of breast: Secondary | ICD-10-CM | POA: Diagnosis not present

## 2015-04-05 DIAGNOSIS — K760 Fatty (change of) liver, not elsewhere classified: Secondary | ICD-10-CM | POA: Diagnosis not present

## 2015-04-05 DIAGNOSIS — E785 Hyperlipidemia, unspecified: Secondary | ICD-10-CM | POA: Diagnosis not present

## 2015-04-05 DIAGNOSIS — K219 Gastro-esophageal reflux disease without esophagitis: Secondary | ICD-10-CM | POA: Diagnosis not present

## 2015-07-02 DIAGNOSIS — J45909 Unspecified asthma, uncomplicated: Secondary | ICD-10-CM | POA: Diagnosis not present

## 2015-07-02 DIAGNOSIS — R05 Cough: Secondary | ICD-10-CM | POA: Diagnosis not present

## 2015-07-03 ENCOUNTER — Other Ambulatory Visit (HOSPITAL_COMMUNITY): Payer: Self-pay | Admitting: Respiratory Therapy

## 2015-07-03 DIAGNOSIS — J45909 Unspecified asthma, uncomplicated: Secondary | ICD-10-CM

## 2015-07-18 DIAGNOSIS — R300 Dysuria: Secondary | ICD-10-CM | POA: Diagnosis not present

## 2015-07-18 DIAGNOSIS — E785 Hyperlipidemia, unspecified: Secondary | ICD-10-CM | POA: Diagnosis not present

## 2015-07-23 ENCOUNTER — Ambulatory Visit (HOSPITAL_COMMUNITY)
Admission: RE | Admit: 2015-07-23 | Discharge: 2015-07-23 | Disposition: A | Discharge status: Home / Self Care | Payer: Medicare Other | Source: Ambulatory Visit | Attending: Internal Medicine | Admitting: Internal Medicine

## 2015-07-23 DIAGNOSIS — J45909 Unspecified asthma, uncomplicated: Secondary | ICD-10-CM | POA: Diagnosis not present

## 2015-07-23 LAB — PULMONARY FUNCTION TEST
DL/VA % pred: 104 %
DL/VA: 5.02 ml/min/mmHg/L
DLCO unc % pred: 80 %
DLCO unc: 19.64 ml/min/mmHg
FEF 25-75 Post: 1.47 L/sec
FEF 25-75 Pre: 1.07 L/sec
FEF2575-%Change-Post: 37 %
FEF2575-%Pred-Post: 75 %
FEF2575-%Pred-Pre: 54 %
FEV1-%Change-Post: 11 %
FEV1-%Pred-Post: 72 %
FEV1-%Pred-Pre: 65 %
FEV1-Post: 1.68 L
FEV1-Pre: 1.51 L
FEV1FVC-%Change-Post: 0 %
FEV1FVC-%Pred-Pre: 94 %
FEV6-%Change-Post: 12 %
FEV6-%Pred-Post: 79 %
FEV6-%Pred-Pre: 70 %
FEV6-Post: 2.29 L
FEV6-Pre: 2.04 L
FEV6FVC-%Change-Post: -1 %
FEV6FVC-%Pred-Post: 101 %
FEV6FVC-%Pred-Pre: 103 %
FVC-%Change-Post: 11 %
FVC-%Pred-Post: 77 %
FVC-%Pred-Pre: 69 %
FVC-Post: 2.35 L
FVC-Pre: 2.11 L
Post FEV1/FVC ratio: 71 %
Post FEV6/FVC ratio: 98 %
Pre FEV1/FVC ratio: 72 %
Pre FEV6/FVC Ratio: 100 %
RV % pred: 121 %
RV: 2.64 L
TLC % pred: 92 %
TLC: 4.66 L

## 2015-07-23 MED ORDER — ALBUTEROL SULFATE (2.5 MG/3ML) 0.083% IN NEBU
2.5000 mg | INHALATION_SOLUTION | Freq: Once | RESPIRATORY_TRACT | Status: AC
  Administered 2015-07-23: 2.5 mg via RESPIRATORY_TRACT

## 2015-07-26 DIAGNOSIS — F17211 Nicotine dependence, cigarettes, in remission: Secondary | ICD-10-CM | POA: Diagnosis not present

## 2015-07-26 DIAGNOSIS — K760 Fatty (change of) liver, not elsewhere classified: Secondary | ICD-10-CM | POA: Diagnosis not present

## 2015-07-26 DIAGNOSIS — J449 Chronic obstructive pulmonary disease, unspecified: Secondary | ICD-10-CM | POA: Diagnosis not present

## 2015-07-26 DIAGNOSIS — K219 Gastro-esophageal reflux disease without esophagitis: Secondary | ICD-10-CM | POA: Diagnosis not present

## 2015-07-26 DIAGNOSIS — Z23 Encounter for immunization: Secondary | ICD-10-CM | POA: Diagnosis not present

## 2015-07-26 DIAGNOSIS — E785 Hyperlipidemia, unspecified: Secondary | ICD-10-CM | POA: Diagnosis not present

## 2015-10-15 DIAGNOSIS — L821 Other seborrheic keratosis: Secondary | ICD-10-CM | POA: Diagnosis not present

## 2015-10-15 DIAGNOSIS — L814 Other melanin hyperpigmentation: Secondary | ICD-10-CM | POA: Diagnosis not present

## 2015-10-15 DIAGNOSIS — L2389 Allergic contact dermatitis due to other agents: Secondary | ICD-10-CM | POA: Diagnosis not present

## 2015-11-06 DIAGNOSIS — M65311 Trigger thumb, right thumb: Secondary | ICD-10-CM | POA: Diagnosis not present

## 2015-11-06 DIAGNOSIS — M47812 Spondylosis without myelopathy or radiculopathy, cervical region: Secondary | ICD-10-CM | POA: Diagnosis not present

## 2015-11-06 DIAGNOSIS — G5602 Carpal tunnel syndrome, left upper limb: Secondary | ICD-10-CM | POA: Diagnosis not present

## 2015-11-25 DIAGNOSIS — M65311 Trigger thumb, right thumb: Secondary | ICD-10-CM | POA: Diagnosis not present

## 2015-11-25 DIAGNOSIS — G5602 Carpal tunnel syndrome, left upper limb: Secondary | ICD-10-CM | POA: Diagnosis not present

## 2015-12-26 DIAGNOSIS — G5602 Carpal tunnel syndrome, left upper limb: Secondary | ICD-10-CM | POA: Diagnosis not present

## 2015-12-30 DIAGNOSIS — G5602 Carpal tunnel syndrome, left upper limb: Secondary | ICD-10-CM | POA: Diagnosis not present

## 2016-01-14 DIAGNOSIS — D709 Neutropenia, unspecified: Secondary | ICD-10-CM | POA: Diagnosis not present

## 2016-01-14 DIAGNOSIS — E785 Hyperlipidemia, unspecified: Secondary | ICD-10-CM | POA: Diagnosis not present

## 2016-01-21 DIAGNOSIS — J449 Chronic obstructive pulmonary disease, unspecified: Secondary | ICD-10-CM | POA: Diagnosis not present

## 2016-01-21 DIAGNOSIS — E785 Hyperlipidemia, unspecified: Secondary | ICD-10-CM | POA: Diagnosis not present

## 2016-01-21 DIAGNOSIS — F17211 Nicotine dependence, cigarettes, in remission: Secondary | ICD-10-CM | POA: Diagnosis not present

## 2016-01-21 DIAGNOSIS — Z13820 Encounter for screening for osteoporosis: Secondary | ICD-10-CM | POA: Diagnosis not present

## 2016-01-21 DIAGNOSIS — Z0001 Encounter for general adult medical examination with abnormal findings: Secondary | ICD-10-CM | POA: Diagnosis not present

## 2016-01-21 DIAGNOSIS — D709 Neutropenia, unspecified: Secondary | ICD-10-CM | POA: Diagnosis not present

## 2016-01-21 DIAGNOSIS — K760 Fatty (change of) liver, not elsewhere classified: Secondary | ICD-10-CM | POA: Diagnosis not present

## 2016-01-21 DIAGNOSIS — N182 Chronic kidney disease, stage 2 (mild): Secondary | ICD-10-CM | POA: Diagnosis not present

## 2016-01-21 DIAGNOSIS — Z1389 Encounter for screening for other disorder: Secondary | ICD-10-CM | POA: Diagnosis not present

## 2016-01-22 DIAGNOSIS — G5602 Carpal tunnel syndrome, left upper limb: Secondary | ICD-10-CM | POA: Diagnosis not present

## 2016-02-03 DIAGNOSIS — G5602 Carpal tunnel syndrome, left upper limb: Secondary | ICD-10-CM | POA: Diagnosis not present

## 2016-02-24 DIAGNOSIS — G5602 Carpal tunnel syndrome, left upper limb: Secondary | ICD-10-CM | POA: Diagnosis not present

## 2016-03-10 DIAGNOSIS — G5602 Carpal tunnel syndrome, left upper limb: Secondary | ICD-10-CM | POA: Diagnosis not present

## 2016-03-16 DIAGNOSIS — G5602 Carpal tunnel syndrome, left upper limb: Secondary | ICD-10-CM | POA: Diagnosis not present

## 2016-03-16 DIAGNOSIS — M79642 Pain in left hand: Secondary | ICD-10-CM | POA: Diagnosis not present

## 2016-03-16 DIAGNOSIS — L905 Scar conditions and fibrosis of skin: Secondary | ICD-10-CM | POA: Diagnosis not present

## 2016-03-16 DIAGNOSIS — M25642 Stiffness of left hand, not elsewhere classified: Secondary | ICD-10-CM | POA: Diagnosis not present

## 2016-03-16 DIAGNOSIS — R29898 Other symptoms and signs involving the musculoskeletal system: Secondary | ICD-10-CM | POA: Diagnosis not present

## 2016-03-19 DIAGNOSIS — M79642 Pain in left hand: Secondary | ICD-10-CM | POA: Diagnosis not present

## 2016-03-19 DIAGNOSIS — R29898 Other symptoms and signs involving the musculoskeletal system: Secondary | ICD-10-CM | POA: Diagnosis not present

## 2016-03-19 DIAGNOSIS — L905 Scar conditions and fibrosis of skin: Secondary | ICD-10-CM | POA: Diagnosis not present

## 2016-03-19 DIAGNOSIS — M25642 Stiffness of left hand, not elsewhere classified: Secondary | ICD-10-CM | POA: Diagnosis not present

## 2016-03-19 DIAGNOSIS — G5602 Carpal tunnel syndrome, left upper limb: Secondary | ICD-10-CM | POA: Diagnosis not present

## 2016-03-23 DIAGNOSIS — M79642 Pain in left hand: Secondary | ICD-10-CM | POA: Diagnosis not present

## 2016-03-23 DIAGNOSIS — L905 Scar conditions and fibrosis of skin: Secondary | ICD-10-CM | POA: Diagnosis not present

## 2016-03-23 DIAGNOSIS — G5602 Carpal tunnel syndrome, left upper limb: Secondary | ICD-10-CM | POA: Diagnosis not present

## 2016-03-23 DIAGNOSIS — R29898 Other symptoms and signs involving the musculoskeletal system: Secondary | ICD-10-CM | POA: Diagnosis not present

## 2016-03-26 DIAGNOSIS — M79642 Pain in left hand: Secondary | ICD-10-CM | POA: Diagnosis not present

## 2016-03-26 DIAGNOSIS — G5602 Carpal tunnel syndrome, left upper limb: Secondary | ICD-10-CM | POA: Diagnosis not present

## 2016-03-26 DIAGNOSIS — R29898 Other symptoms and signs involving the musculoskeletal system: Secondary | ICD-10-CM | POA: Diagnosis not present

## 2016-03-26 DIAGNOSIS — L905 Scar conditions and fibrosis of skin: Secondary | ICD-10-CM | POA: Diagnosis not present

## 2016-03-30 DIAGNOSIS — G5602 Carpal tunnel syndrome, left upper limb: Secondary | ICD-10-CM | POA: Diagnosis not present

## 2016-07-13 DIAGNOSIS — N182 Chronic kidney disease, stage 2 (mild): Secondary | ICD-10-CM | POA: Diagnosis not present

## 2016-07-13 DIAGNOSIS — E785 Hyperlipidemia, unspecified: Secondary | ICD-10-CM | POA: Diagnosis not present

## 2016-07-14 DIAGNOSIS — L900 Lichen sclerosus et atrophicus: Secondary | ICD-10-CM | POA: Diagnosis not present

## 2016-07-20 DIAGNOSIS — D709 Neutropenia, unspecified: Secondary | ICD-10-CM | POA: Diagnosis not present

## 2016-07-20 DIAGNOSIS — N182 Chronic kidney disease, stage 2 (mild): Secondary | ICD-10-CM | POA: Diagnosis not present

## 2016-07-20 DIAGNOSIS — E785 Hyperlipidemia, unspecified: Secondary | ICD-10-CM | POA: Diagnosis not present

## 2016-07-20 DIAGNOSIS — J449 Chronic obstructive pulmonary disease, unspecified: Secondary | ICD-10-CM | POA: Diagnosis not present

## 2016-07-20 DIAGNOSIS — F17211 Nicotine dependence, cigarettes, in remission: Secondary | ICD-10-CM | POA: Diagnosis not present

## 2016-07-27 DIAGNOSIS — Z124 Encounter for screening for malignant neoplasm of cervix: Secondary | ICD-10-CM | POA: Diagnosis not present

## 2016-07-27 DIAGNOSIS — L900 Lichen sclerosus et atrophicus: Secondary | ICD-10-CM | POA: Diagnosis not present

## 2016-07-27 DIAGNOSIS — Z01419 Encounter for gynecological examination (general) (routine) without abnormal findings: Secondary | ICD-10-CM | POA: Diagnosis not present

## 2016-07-28 DIAGNOSIS — Z1231 Encounter for screening mammogram for malignant neoplasm of breast: Secondary | ICD-10-CM | POA: Diagnosis not present

## 2016-10-05 DIAGNOSIS — N390 Urinary tract infection, site not specified: Secondary | ICD-10-CM | POA: Diagnosis not present

## 2016-10-05 DIAGNOSIS — R319 Hematuria, unspecified: Secondary | ICD-10-CM | POA: Diagnosis not present

## 2016-10-05 DIAGNOSIS — R300 Dysuria: Secondary | ICD-10-CM | POA: Diagnosis not present

## 2016-10-05 DIAGNOSIS — Z23 Encounter for immunization: Secondary | ICD-10-CM | POA: Diagnosis not present

## 2016-12-07 HISTORY — PX: COLONOSCOPY: SHX174

## 2017-01-20 DIAGNOSIS — E785 Hyperlipidemia, unspecified: Secondary | ICD-10-CM | POA: Diagnosis not present

## 2017-01-27 DIAGNOSIS — E785 Hyperlipidemia, unspecified: Secondary | ICD-10-CM | POA: Diagnosis not present

## 2017-01-27 DIAGNOSIS — N182 Chronic kidney disease, stage 2 (mild): Secondary | ICD-10-CM | POA: Diagnosis not present

## 2017-01-27 DIAGNOSIS — J449 Chronic obstructive pulmonary disease, unspecified: Secondary | ICD-10-CM | POA: Diagnosis not present

## 2017-01-27 DIAGNOSIS — D709 Neutropenia, unspecified: Secondary | ICD-10-CM | POA: Diagnosis not present

## 2017-02-25 DIAGNOSIS — F4321 Adjustment disorder with depressed mood: Secondary | ICD-10-CM | POA: Diagnosis not present

## 2017-04-22 DIAGNOSIS — L720 Epidermal cyst: Secondary | ICD-10-CM | POA: Diagnosis not present

## 2017-04-22 DIAGNOSIS — D1801 Hemangioma of skin and subcutaneous tissue: Secondary | ICD-10-CM | POA: Diagnosis not present

## 2017-04-22 DIAGNOSIS — D2262 Melanocytic nevi of left upper limb, including shoulder: Secondary | ICD-10-CM | POA: Diagnosis not present

## 2017-04-22 DIAGNOSIS — L570 Actinic keratosis: Secondary | ICD-10-CM | POA: Diagnosis not present

## 2017-04-22 DIAGNOSIS — L821 Other seborrheic keratosis: Secondary | ICD-10-CM | POA: Diagnosis not present

## 2017-04-22 DIAGNOSIS — L218 Other seborrheic dermatitis: Secondary | ICD-10-CM | POA: Diagnosis not present

## 2017-04-22 DIAGNOSIS — D225 Melanocytic nevi of trunk: Secondary | ICD-10-CM | POA: Diagnosis not present

## 2017-04-22 DIAGNOSIS — D2372 Other benign neoplasm of skin of left lower limb, including hip: Secondary | ICD-10-CM | POA: Diagnosis not present

## 2017-07-22 DIAGNOSIS — E785 Hyperlipidemia, unspecified: Secondary | ICD-10-CM | POA: Diagnosis not present

## 2017-07-22 DIAGNOSIS — N182 Chronic kidney disease, stage 2 (mild): Secondary | ICD-10-CM | POA: Diagnosis not present

## 2017-07-29 ENCOUNTER — Other Ambulatory Visit: Payer: Self-pay | Admitting: Internal Medicine

## 2017-07-29 DIAGNOSIS — R42 Dizziness and giddiness: Secondary | ICD-10-CM

## 2017-07-29 DIAGNOSIS — E785 Hyperlipidemia, unspecified: Secondary | ICD-10-CM | POA: Diagnosis not present

## 2017-07-29 DIAGNOSIS — R5383 Other fatigue: Secondary | ICD-10-CM

## 2017-07-29 DIAGNOSIS — D709 Neutropenia, unspecified: Secondary | ICD-10-CM | POA: Diagnosis not present

## 2017-07-29 DIAGNOSIS — S0091XA Abrasion of unspecified part of head, initial encounter: Secondary | ICD-10-CM

## 2017-07-29 DIAGNOSIS — N182 Chronic kidney disease, stage 2 (mild): Secondary | ICD-10-CM | POA: Diagnosis not present

## 2017-07-29 DIAGNOSIS — J449 Chronic obstructive pulmonary disease, unspecified: Secondary | ICD-10-CM | POA: Diagnosis not present

## 2017-07-30 ENCOUNTER — Other Ambulatory Visit: Payer: Self-pay | Admitting: Internal Medicine

## 2017-07-30 ENCOUNTER — Ambulatory Visit
Admission: RE | Admit: 2017-07-30 | Discharge: 2017-07-30 | Disposition: A | Discharge status: Home / Self Care | Payer: Medicare Other | Source: Ambulatory Visit | Attending: Internal Medicine | Admitting: Internal Medicine

## 2017-07-30 ENCOUNTER — Other Ambulatory Visit: Payer: PRIVATE HEALTH INSURANCE

## 2017-07-30 DIAGNOSIS — S0091XA Abrasion of unspecified part of head, initial encounter: Secondary | ICD-10-CM

## 2017-07-30 DIAGNOSIS — R51 Headache: Secondary | ICD-10-CM | POA: Diagnosis not present

## 2017-07-30 DIAGNOSIS — S0990XA Unspecified injury of head, initial encounter: Secondary | ICD-10-CM | POA: Diagnosis not present

## 2017-08-11 DIAGNOSIS — Z1231 Encounter for screening mammogram for malignant neoplasm of breast: Secondary | ICD-10-CM | POA: Diagnosis not present

## 2017-08-17 ENCOUNTER — Encounter: Payer: Self-pay | Admitting: Oncology

## 2017-08-17 ENCOUNTER — Telehealth: Payer: Self-pay | Admitting: Oncology

## 2017-08-17 NOTE — Telephone Encounter (Signed)
Appt has been scheduled for the pt to see Dr. Alen Blew on 9/28 at 11am. Letter mailed with the appt date and time. Letter faxed to the referring to notify the pt

## 2017-09-03 ENCOUNTER — Encounter: Payer: Self-pay | Admitting: Oncology

## 2017-09-03 ENCOUNTER — Ambulatory Visit (HOSPITAL_BASED_OUTPATIENT_CLINIC_OR_DEPARTMENT_OTHER): Payer: Medicare Other

## 2017-09-03 ENCOUNTER — Ambulatory Visit (HOSPITAL_BASED_OUTPATIENT_CLINIC_OR_DEPARTMENT_OTHER): Payer: Medicare Other | Admitting: Oncology

## 2017-09-03 ENCOUNTER — Telehealth: Payer: Self-pay | Admitting: Oncology

## 2017-09-03 VITALS — BP 148/61 | HR 79 | Temp 98.0°F | Resp 17 | Ht 66.0 in | Wt 169.9 lb

## 2017-09-03 DIAGNOSIS — R740 Nonspecific elevation of levels of transaminase and lactic acid dehydrogenase [LDH]: Secondary | ICD-10-CM

## 2017-09-03 DIAGNOSIS — R7402 Elevation of levels of lactic acid dehydrogenase (LDH): Secondary | ICD-10-CM

## 2017-09-03 DIAGNOSIS — R1032 Left lower quadrant pain: Secondary | ICD-10-CM | POA: Diagnosis not present

## 2017-09-03 DIAGNOSIS — D696 Thrombocytopenia, unspecified: Secondary | ICD-10-CM | POA: Diagnosis not present

## 2017-09-03 DIAGNOSIS — R7401 Elevation of levels of liver transaminase levels: Secondary | ICD-10-CM

## 2017-09-03 DIAGNOSIS — F1020 Alcohol dependence, uncomplicated: Secondary | ICD-10-CM

## 2017-09-03 LAB — CBC WITH DIFFERENTIAL/PLATELET
BASO%: 1.2 % (ref 0.0–2.0)
Basophils Absolute: 0 10*3/uL (ref 0.0–0.1)
EOS%: 3.2 % (ref 0.0–7.0)
Eosinophils Absolute: 0.1 10*3/uL (ref 0.0–0.5)
HCT: 39.2 % (ref 34.8–46.6)
HGB: 13.1 g/dL (ref 11.6–15.9)
LYMPH%: 32.4 % (ref 14.0–49.7)
MCH: 33.3 pg (ref 25.1–34.0)
MCHC: 33.4 g/dL (ref 31.5–36.0)
MCV: 99.7 fL (ref 79.5–101.0)
MONO#: 0.5 10*3/uL (ref 0.1–0.9)
MONO%: 17.8 % — ABNORMAL HIGH (ref 0.0–14.0)
NEUT#: 1.2 10*3/uL — ABNORMAL LOW (ref 1.5–6.5)
NEUT%: 45.4 % (ref 38.4–76.8)
Platelets: 102 10*3/uL — ABNORMAL LOW (ref 145–400)
RBC: 3.93 10*6/uL (ref 3.70–5.45)
RDW: 14.5 % (ref 11.2–14.5)
WBC: 2.5 10*3/uL — ABNORMAL LOW (ref 3.9–10.3)
lymph#: 0.8 10*3/uL — ABNORMAL LOW (ref 0.9–3.3)

## 2017-09-03 LAB — COMPREHENSIVE METABOLIC PANEL
ALT: 61 U/L — ABNORMAL HIGH (ref 0–55)
AST: 92 U/L — ABNORMAL HIGH (ref 5–34)
Albumin: 3.4 g/dL — ABNORMAL LOW (ref 3.5–5.0)
Alkaline Phosphatase: 113 U/L (ref 40–150)
Anion Gap: 7 mEq/L (ref 3–11)
BUN: 11.5 mg/dL (ref 7.0–26.0)
CO2: 27 mEq/L (ref 22–29)
Calcium: 9.5 mg/dL (ref 8.4–10.4)
Chloride: 107 mEq/L (ref 98–109)
Creatinine: 0.8 mg/dL (ref 0.6–1.1)
EGFR: 79 mL/min/{1.73_m2} — ABNORMAL LOW (ref >90)
Glucose: 118 mg/dl (ref 70–140)
Potassium: 4.1 mEq/L (ref 3.5–5.1)
Sodium: 141 mEq/L (ref 136–145)
Total Bilirubin: 1.57 mg/dL — ABNORMAL HIGH (ref 0.20–1.20)
Total Protein: 7.3 g/dL (ref 6.4–8.3)

## 2017-09-03 LAB — CHCC SMEAR

## 2017-09-03 NOTE — Telephone Encounter (Signed)
Gave patient AVS and calendar of upcoming November appointments.  °

## 2017-09-03 NOTE — Progress Notes (Signed)
Reason for Referral: Thrombocytopenia   HPI: 71 year old woman currently of [place], [place] and has lived around that area the majority of her life. She is a woman without any significant comorbid conditions except for mild arthritis and hyperlipidemia. She has been diagnosed with fatty liver in the past and her last ultrasound was in 2012. She also has chronic alcohol consumption and currently reports drinking 31 glasses a day. She is also involved in the care of her husband who has multiple health issues. She had a routine physical examination with her primary care providers and a CBC was obtained and noted that to have white cell count of 2.6, hemoglobin is 13.2 and a platelet count of 104. Her MCV was normal at 97 with neutrophil percentage of 34.8% and monocytosis of 13.7%. She had also mild asymmetry of 7.6%. Previous CBC dating back to August 2017 showed a white cell count 3.4 and a platelet count of 106. Her differential was similar. She reports no specific symptoms related to these findings. She did have a fall in August 2018 and a CT scan of the head did not show any trauma or injuries. She remains active and attends to activities of daily living including driving and eating well. She has not reported any specific complications related to alcohol consumption. She does report tremors however in her upper and sometimes lower extremities. She denied any confusion or altered mental status.  She does not report any headaches, blurry vision, syncope or seizures. She does not report any fevers, chills, sweats or weight loss. She does not report any chest pain, palpitation, orthopnea or leg edema. She does not report any cough, wheezing or hemoptysis. She does not report any nausea, vomiting or abdominal pain. She does not report any constipation or diarrhea. She does not report any hematochezia or melena. She does not report any frequency, urgency or hesitancy. She does not report any skeletal  complaints. Remaining review of systems unremarkable.   Past Medical History:  Diagnosis Date  . Arthritis   . Hyperlipidemia   . Spastic esophagus   :  Past Surgical History:  Procedure Laterality Date  . ABDOMINAL HYSTERECTOMY     menorrhagia  . BUNIONECTOMY     both feet  . CARPAL TUNNEL RELEASE  10/13/2011   Procedure: CARPAL TUNNEL RELEASE;  Surgeon: Wynonia Sours, MD;  Location: [facility];  Service: Orthopedics;  Laterality: Right;  . EAR CYST EXCISION  10/13/2011   Procedure: CYST REMOVAL;  Surgeon: Wynonia Sours, MD;  Location: [facility];  Service: Orthopedics;  Laterality: Right;  excision cyst possible rotator flap, debridement dip right middle finger  . left knee arthroscopy    . right rotator cuff repair    :   Current Outpatient Prescriptions:  .  aspirin 81 MG tablet, Take 81 mg by mouth daily. , Disp: , Rfl:  .  clobetasol ointment (TEMOVATE) 4.62 %, Apply 1 application topically 2 (two) times daily., Disp: , Rfl:  .  FLUoxetine (PROZAC) 20 MG/5ML solution, Take 20 mg by mouth daily., Disp: , Rfl:  .  Magnesium 400 MG CAPS, Take 400 mg by mouth., Disp: , Rfl:  .  pravastatin (PRAVACHOL) 20 MG tablet, Take 1 tablet (20 mg total) by mouth at bedtime., Disp: 90 tablet, Rfl: 3:  No Known Allergies:  Family History  Problem Relation Age of Onset  . Heart failure Mother   :  Social History   Social History  . Marital  status: Married    Spouse name: N/A  . Number of children: N/A  . Years of education: N/A   Occupational History  . Not on file.   Social History Main Topics  . Smoking status: Former Smoker    Quit date: 04/07/1991  . Smokeless tobacco: Not on file  . Alcohol use No  . Drug use: No  . Sexual activity: Not on file   Other Topics Concern  . Not on file   Social History Narrative   Lives in [place]. Moved back here to be closer to children and grandchildren from [place].      Does exercise  :  Pertinent  items are noted in HPI.  Exam: Blood pressure (!) 148/61, pulse 79, temperature 98 F (36.7 C), temperature source Oral, resp. rate 17, height _0  (1.676 m), weight 169 lb 14.4 oz (77.1 kg), SpO2 97 %.  ECOG 0  General appearance: Appeared without distress. Throat: No thrush or ulcers.  Sclerae anicteric Neck: no adenopathy Back: negative Resp: clear to auscultation bilaterally Chest wall: no tenderness Cardio: regular rate and rhythm, S1, S2 normal, no murmur, click, rub or gallop GI: soft, non-tender; bowel sounds normal; no masses,  no splenomegaly noted. Extremities: extremities normal, atraumatic, no cyanosis or edema Pulses: 2+ and symmetric Skin: Spider angiomas noted. Lymph nodes: Cervical, supraclavicular, and axillary nodes normal.   Recent Labs  09/03/17 1141  WBC 2.5*  HGB 13.1  HCT 39.2  PLT 102*    Recent Labs  09/03/17 1141  NA 141  K 4.1  CO2 27  GLUCOSE 118  BUN 11.5  CREATININE 0.8  CALCIUM 9.5     Blood smear review: Personally reviewed today and showed no evidence of schistocytes or red cell fragments. No dysplasia or immaturity cells.   Assessment and Plan:   1. Leukocytopenia and thrombocytopenia: These findings are chronic in nature dating back to at least 2017. Her white cell count have ranged between 2.5 and 3.4. Her platelet count has been above 100,000. Her differential today showed no evidence of neutropenia or lymphocytosis. Her peripheral smear did not show any blasts or immature cells. It did not show any dysplasia. She does have monocytosis which is mild at this time.  These findings were reviewed today with the patient and the differential diagnosis was discussed. These could be reactive findings related to liver disease and chronic alcohol consumption. Splenomegaly from cirrhosis of the liver can results and leukocytopenia and thrombocytopenia. Other concerns would be early myelodysplastic syndrome.   For the time being, I have  recommended continued observation and surveillance I consider bone marrow biopsy if she develops any further cytopenias. In the meantime I recommended ultrasound of the abdomen to evaluate for splenomegaly and cirrhosis of the liver.  2. Liver disease: She has elevated AST and ALT that has been chronic in nature. Her bilirubin is also elevated at 1.57. She is exhibiting stigmata of cirrhosis of the liver which include spider angiomas as well as asterixis. She already had a couple falls without any injuries.  I recommend hepatology evaluation of if ultrasound does suggest worsening liver disease. I also recommend that she decrease the amount of alcohol she drinks as soon as possible.  3. Follow-up: Will be in 3 months to repeat CBC and follow her progress.

## 2017-09-14 ENCOUNTER — Telehealth: Payer: Self-pay

## 2017-09-14 NOTE — Telephone Encounter (Signed)
Pt requested a call back. 1st callback no answer on cell or home phones.   Pt saw Dr Alen Blew Sept 28. He ordered  "US liver and all". No appt scheduled yet. The order is in and PA done. Will have central scheduling call pt. Called central scheduling and they have attempted to call pt twice. They asked that pt contact them. Called pt back and gave her number to central scheduling.

## 2017-09-16 ENCOUNTER — Ambulatory Visit (HOSPITAL_COMMUNITY)
Admission: RE | Admit: 2017-09-16 | Discharge: 2017-09-16 | Disposition: A | Discharge status: Home / Self Care | Payer: Medicare Other | Source: Ambulatory Visit | Attending: Oncology | Admitting: Oncology

## 2017-09-16 DIAGNOSIS — R7401 Elevation of levels of liver transaminase levels: Secondary | ICD-10-CM

## 2017-09-16 DIAGNOSIS — R7402 Elevation of levels of lactic acid dehydrogenase (LDH): Secondary | ICD-10-CM

## 2017-09-16 DIAGNOSIS — R1032 Left lower quadrant pain: Secondary | ICD-10-CM | POA: Diagnosis not present

## 2017-09-16 DIAGNOSIS — D696 Thrombocytopenia, unspecified: Secondary | ICD-10-CM | POA: Diagnosis not present

## 2017-09-16 DIAGNOSIS — K760 Fatty (change of) liver, not elsewhere classified: Secondary | ICD-10-CM | POA: Insufficient documentation

## 2017-09-16 DIAGNOSIS — R740 Nonspecific elevation of levels of transaminase and lactic acid dehydrogenase [LDH]: Secondary | ICD-10-CM | POA: Insufficient documentation

## 2017-09-28 DIAGNOSIS — K760 Fatty (change of) liver, not elsewhere classified: Secondary | ICD-10-CM | POA: Diagnosis not present

## 2017-09-28 DIAGNOSIS — D696 Thrombocytopenia, unspecified: Secondary | ICD-10-CM | POA: Diagnosis not present

## 2017-09-28 DIAGNOSIS — R1030 Lower abdominal pain, unspecified: Secondary | ICD-10-CM | POA: Diagnosis not present

## 2017-09-28 DIAGNOSIS — Z1212 Encounter for screening for malignant neoplasm of rectum: Secondary | ICD-10-CM | POA: Diagnosis not present

## 2017-09-28 DIAGNOSIS — R102 Pelvic and perineal pain: Secondary | ICD-10-CM | POA: Diagnosis not present

## 2017-09-29 ENCOUNTER — Other Ambulatory Visit: Payer: Self-pay | Admitting: Internal Medicine

## 2017-09-29 ENCOUNTER — Encounter: Payer: Self-pay | Admitting: *Deleted

## 2017-09-29 DIAGNOSIS — R102 Pelvic and perineal pain: Secondary | ICD-10-CM

## 2017-10-05 ENCOUNTER — Other Ambulatory Visit: Payer: Self-pay | Admitting: Internal Medicine

## 2017-10-05 DIAGNOSIS — K760 Fatty (change of) liver, not elsewhere classified: Secondary | ICD-10-CM

## 2017-10-11 ENCOUNTER — Other Ambulatory Visit: Payer: Medicare Other

## 2017-12-03 ENCOUNTER — Ambulatory Visit (HOSPITAL_BASED_OUTPATIENT_CLINIC_OR_DEPARTMENT_OTHER): Payer: Medicare Other | Admitting: Oncology

## 2017-12-03 ENCOUNTER — Telehealth: Payer: Self-pay | Admitting: Oncology

## 2017-12-03 ENCOUNTER — Other Ambulatory Visit (HOSPITAL_BASED_OUTPATIENT_CLINIC_OR_DEPARTMENT_OTHER): Payer: Medicare Other

## 2017-12-03 VITALS — BP 146/66 | HR 81 | Temp 98.7°F | Resp 18 | Ht 66.0 in | Wt 172.4 lb

## 2017-12-03 DIAGNOSIS — R7402 Elevation of levels of lactic acid dehydrogenase (LDH): Secondary | ICD-10-CM

## 2017-12-03 DIAGNOSIS — D72819 Decreased white blood cell count, unspecified: Secondary | ICD-10-CM | POA: Diagnosis not present

## 2017-12-03 DIAGNOSIS — D696 Thrombocytopenia, unspecified: Secondary | ICD-10-CM

## 2017-12-03 DIAGNOSIS — R740 Nonspecific elevation of levels of transaminase and lactic acid dehydrogenase [LDH]: Secondary | ICD-10-CM

## 2017-12-03 DIAGNOSIS — R7401 Elevation of levels of liver transaminase levels: Secondary | ICD-10-CM

## 2017-12-03 DIAGNOSIS — R1032 Left lower quadrant pain: Secondary | ICD-10-CM

## 2017-12-03 LAB — CBC WITH DIFFERENTIAL/PLATELET
BASO%: 0.7 % (ref 0.0–2.0)
Basophils Absolute: 0 10*3/uL (ref 0.0–0.1)
EOS%: 5.6 % (ref 0.0–7.0)
Eosinophils Absolute: 0.2 10*3/uL (ref 0.0–0.5)
HCT: 41.1 % (ref 34.8–46.6)
HGB: 13.4 g/dL (ref 11.6–15.9)
LYMPH%: 39.8 % (ref 14.0–49.7)
MCH: 32.6 pg (ref 25.1–34.0)
MCHC: 32.6 g/dL (ref 31.5–36.0)
MCV: 100 fL (ref 79.5–101.0)
MONO#: 0.4 10*3/uL (ref 0.1–0.9)
MONO%: 13.5 % (ref 0.0–14.0)
NEUT#: 1.2 10*3/uL — ABNORMAL LOW (ref 1.5–6.5)
NEUT%: 40.4 % (ref 38.4–76.8)
Platelets: 99 10*3/uL — ABNORMAL LOW (ref 145–400)
RBC: 4.11 10*6/uL (ref 3.70–5.45)
RDW: 14.3 % (ref 11.2–14.5)
WBC: 3 10*3/uL — ABNORMAL LOW (ref 3.9–10.3)
lymph#: 1.2 10*3/uL (ref 0.9–3.3)

## 2017-12-03 NOTE — Progress Notes (Signed)
Hematology and Oncology Follow Up Visit  Darlene Delacruz [id number] 08-12-1946 71 y.o. 12/03/2017 10:07 AM Thressa Sheller, MDMackenzie, Aaron Edelman, MD   Principle Diagnosis: 71 year old woman with thrombocytopenia diagnosed in 2017.  Her platelet count ranged close to 100,000.  Etiology is related to alcohol consumption and early liver disease.   Prior Therapy: None needed.  Current therapy: Observation and surveillance.  Interim History: Darlene Delacruz presented today for a follow-up visit.  Since the last visit, she reports no recent complaints.  She remains active and attends activities of daily living including being the caregiver for her husband.  She denies any epistaxis, hematochezia or melena.  She continues to drink daily although she is trying to cut down.  She does not report any headaches, blurry vision, syncope or seizures. She does not report any fevers, chills, sweats or weight loss. She does not report any chest pain, palpitation, orthopnea or leg edema. She does not report any cough, wheezing or hemoptysis. She does not report any nausea, vomiting or abdominal pain. She does not report any constipation or diarrhea. She does not report any frequency, urgency or hesitancy. She does not report any skeletal complaints. Remaining review of systems unremarkable.   Medications: I have reviewed the patient's current medications.  Current Outpatient Medications  Medication Sig Dispense Refill  . aspirin 81 MG tablet Take 81 mg by mouth daily.     . clobetasol ointment (TEMOVATE) 7.16 % Apply 1 application topically 2 (two) times daily.    Marland Kitchen FLUoxetine (PROZAC) 20 MG/5ML solution Take 20 mg by mouth daily.    . Magnesium 400 MG CAPS Take 400 mg by mouth.    . pravastatin (PRAVACHOL) 20 MG tablet Take 1 tablet (20 mg total) by mouth at bedtime. 90 tablet 3   No current facility-administered medications for this visit.      Allergies: No Known Allergies  Past Medical History, Surgical  history, Social history, and Family History were reviewed and updated.  Physical Exam: Blood pressure (!) 146/66, pulse 81, temperature 98.7 F (37.1 C), temperature source Oral, resp. rate 18, height 5\' 6"  (1.676 m), weight 172 lb 6.4 oz (78.2 kg), SpO2 96 %. ECOG: 1 General appearance: alert and cooperative appeared without distress. Head: Normocephalic, without obvious abnormality no oral thrush or ulcers. Neck: no adenopathy Lymph nodes: Cervical, supraclavicular, and axillary nodes normal. Heart:regular rate and rhythm, S1, S2 normal, no murmur, click, rub or gallop Lung:chest clear, no wheezing, rales, normal symmetric air entry. Abdomin: soft, non-tender, without masses or organomegaly EXT:no erythema, induration, or nodules   Lab Results: Lab Results  Component Value Date   WBC 3.0 (L) 12/03/2017   HGB 13.4 12/03/2017   HCT 41.1 12/03/2017   MCV 100.0 12/03/2017   PLT 99 (L) 12/03/2017     Chemistry      Component Value Date/Time   NA 141 09/03/2017 1141   K 4.1 09/03/2017 1141   CO2 27 09/03/2017 1141   BUN 11.5 09/03/2017 1141   CREATININE 0.8 09/03/2017 1141      Component Value Date/Time   CALCIUM 9.5 09/03/2017 1141   ALKPHOS 113 09/03/2017 1141   AST 92 (H) 09/03/2017 1141   ALT 61 (H) 09/03/2017 1141   BILITOT 1.57 (H) 09/03/2017 1141       Radiological Studies: EXAM: ABDOMEN ULTRASOUND COMPLETE  COMPARISON:  04/10/2014  FINDINGS: Gallbladder: No gallstones or wall thickening visualized. No sonographic Murphy sign noted by sonographer.  Common bile duct: Diameter: 5.3 mm  Liver: Mild increased hepatic echogenicity compatible with hepatic steatosis or fatty infiltration. No focal hepatic abnormality or intrahepatic biliary dilatation. Portal vein is patent on color Doppler imaging with normal direction of blood flow towards the liver.  IVC: No abnormality visualized.  Pancreas: Visualized portion unremarkable.  Spleen: Normal in  size and appearance.  7.3 cm length.  Right Kidney: Length: 11.9 cm. Echogenicity within normal limits. No mass or hydronephrosis visualized.  Left Kidney: Length: 11.6 cm. Echogenicity within normal limits. No mass or hydronephrosis visualized.  Abdominal aorta: No aneurysm visualized.  Other findings: No free fluid or ascites  IMPRESSION: Mild hepatic steatosis.  No acute intra-abdominal finding by ultrasound.   Impression and Plan:  1. Thrombocytopenia: These findings are chronic in nature dating back to at least 2017. Her platelet count has been above 100,000. Her peripheral smear did not show any blasts or immature cells. It did not show any dysplasia.   Repeat CBC today continues to show relatively stable platelet count.  The etiology is related to chronic alcohol consumption and early liver disease.  Ultrasound of the abdomen obtained in October 2018 confirmed some of these findings.  She has no active bleeding noted and no intervention is needed.  I recommended continued observation and surveillance for the time being without any intervention at this time.  2.  Leukocytopenia: Related to the same etiology and likely sequestration related to liver disease.  Her ANC is more than adequate without any recurrent infections.  3.  Age-appropriate cancer screening: She is up-to-date and she is due for colonoscopy in the near future and I urged her to do so.  4.  Follow-up: I am happy to see her in the future as needed if her counts drop further.     Zola Button, MD 12/28/201810:07 AM

## 2017-12-03 NOTE — Telephone Encounter (Signed)
No 12/28 los.  

## 2017-12-28 DIAGNOSIS — Z1211 Encounter for screening for malignant neoplasm of colon: Secondary | ICD-10-CM | POA: Diagnosis not present

## 2017-12-28 DIAGNOSIS — Z86010 Personal history of colonic polyps: Secondary | ICD-10-CM | POA: Diagnosis not present

## 2017-12-28 DIAGNOSIS — K760 Fatty (change of) liver, not elsewhere classified: Secondary | ICD-10-CM | POA: Diagnosis not present

## 2017-12-28 DIAGNOSIS — R945 Abnormal results of liver function studies: Secondary | ICD-10-CM | POA: Diagnosis not present

## 2018-01-09 ENCOUNTER — Other Ambulatory Visit: Payer: Self-pay

## 2018-01-09 ENCOUNTER — Encounter: Payer: Self-pay | Admitting: Emergency Medicine

## 2018-01-09 ENCOUNTER — Emergency Department
Admission: EM | Admit: 2018-01-09 | Discharge: 2018-01-09 | Disposition: A | Discharge status: Home / Self Care | Payer: Medicare Other | Attending: Emergency Medicine | Admitting: Emergency Medicine

## 2018-01-09 ENCOUNTER — Emergency Department: Payer: Medicare Other

## 2018-01-09 DIAGNOSIS — Z79899 Other long term (current) drug therapy: Secondary | ICD-10-CM | POA: Diagnosis not present

## 2018-01-09 DIAGNOSIS — R1010 Upper abdominal pain, unspecified: Secondary | ICD-10-CM | POA: Diagnosis not present

## 2018-01-09 DIAGNOSIS — J811 Chronic pulmonary edema: Secondary | ICD-10-CM | POA: Diagnosis not present

## 2018-01-09 DIAGNOSIS — Z7982 Long term (current) use of aspirin: Secondary | ICD-10-CM | POA: Diagnosis not present

## 2018-01-09 DIAGNOSIS — J810 Acute pulmonary edema: Secondary | ICD-10-CM | POA: Diagnosis not present

## 2018-01-09 DIAGNOSIS — I700 Atherosclerosis of aorta: Secondary | ICD-10-CM | POA: Insufficient documentation

## 2018-01-09 DIAGNOSIS — R112 Nausea with vomiting, unspecified: Secondary | ICD-10-CM | POA: Diagnosis not present

## 2018-01-09 DIAGNOSIS — Z87891 Personal history of nicotine dependence: Secondary | ICD-10-CM | POA: Insufficient documentation

## 2018-01-09 LAB — LIPASE, BLOOD: Lipase: 36 U/L (ref 11–51)

## 2018-01-09 LAB — URINALYSIS, COMPLETE (UACMP) WITH MICROSCOPIC
Bacteria, UA: NONE SEEN
Bilirubin Urine: NEGATIVE
Glucose, UA: NEGATIVE mg/dL
Hgb urine dipstick: NEGATIVE
Ketones, ur: NEGATIVE mg/dL
Nitrite: NEGATIVE
Protein, ur: 30 mg/dL — AB
Specific Gravity, Urine: 1.02 (ref 1.005–1.030)
pH: 6 (ref 5.0–8.0)

## 2018-01-09 LAB — CBC
HCT: 40 % (ref 35.0–47.0)
Hemoglobin: 13.2 g/dL (ref 12.0–16.0)
MCH: 32.4 pg (ref 26.0–34.0)
MCHC: 33 g/dL (ref 32.0–36.0)
MCV: 98.1 fL (ref 80.0–100.0)
Platelets: 101 10*3/uL — ABNORMAL LOW (ref 150–440)
RBC: 4.07 MIL/uL (ref 3.80–5.20)
RDW: 13.9 % (ref 11.5–14.5)
WBC: 6.9 10*3/uL (ref 3.6–11.0)

## 2018-01-09 LAB — COMPREHENSIVE METABOLIC PANEL
ALT: 105 U/L — ABNORMAL HIGH (ref 14–54)
AST: 145 U/L — ABNORMAL HIGH (ref 15–41)
Albumin: 3.4 g/dL — ABNORMAL LOW (ref 3.5–5.0)
Alkaline Phosphatase: 108 U/L (ref 38–126)
Anion gap: 12 (ref 5–15)
BUN: 12 mg/dL (ref 6–20)
CO2: 23 mmol/L (ref 22–32)
Calcium: 9 mg/dL (ref 8.9–10.3)
Chloride: 107 mmol/L (ref 101–111)
Creatinine, Ser: 0.65 mg/dL (ref 0.44–1.00)
GFR calc Af Amer: >60 mL/min (ref >60)
GFR calc non Af Amer: >60 mL/min (ref >60)
Glucose, Bld: 127 mg/dL — ABNORMAL HIGH (ref 65–99)
Potassium: 3.7 mmol/L (ref 3.5–5.1)
Sodium: 142 mmol/L (ref 135–145)
Total Bilirubin: 2 mg/dL — ABNORMAL HIGH (ref 0.3–1.2)
Total Protein: 7.1 g/dL (ref 6.5–8.1)

## 2018-01-09 LAB — TROPONIN I: Troponin I: <0.03 ng/mL (ref <0.03)

## 2018-01-09 MED ORDER — GI COCKTAIL ~~LOC~~
ORAL | Status: AC
  Filled 2018-01-09: qty 30

## 2018-01-09 MED ORDER — GI COCKTAIL ~~LOC~~
30.0000 mL | Freq: Once | ORAL | Status: AC
  Administered 2018-01-09: 30 mL via ORAL

## 2018-01-09 NOTE — ED Notes (Signed)
Patient reports significant pain reduction after GI cocktail.

## 2018-01-09 NOTE — ED Provider Notes (Addendum)
[facility] Emergency Department Provider Note  ____________________________________________   First MD Initiated Contact with Patient 01/09/18 1507     (approximate)  I have reviewed the triage vital signs and the nursing notes.   HISTORY  Chief Complaint Abdominal Pain   HPI Darlene Delacruz is a 72 y.o. female with a history of a spastic esophagus as well as abdominal pain over the past several hours.  The patient is a 4-5 out of 10 it is a dull pain across the upper abdomen.  She says it was radiating to the back previously but has no back pain at this time.  Denies any history of kidney stones.  Says that she vomited once.  No diarrhea.  No fever.  No cough, runny nose or body aches.  Patient says that she has struggled in the past with reflux.  Says that she does not take any medications at this time except for an intermittent bleed taking a cholesterol pill.  Patient says that she still has her gallbladder and is aware of her liver labs being elevated secondary to a "fatty liver."  Patient took 3 aspirin prior to arrival secondary to being concerned about cardiac disease because of the upper abdominal pain.  However, she is denying any chest pain or shortness of breath.   Past Medical History:  Diagnosis Date  . Arthritis   . Hyperlipidemia   . Spastic esophagus     Patient Active Problem List   Diagnosis Date Noted  . Abdominal pain, left lower quadrant 03/06/2011  . SINUSITIS - ACUTE-NOS 11/28/2010  . OTHER ACUTE SINUSITIS 11/19/2010  . TRANSAMINASES, SERUM, ELEVATED 10/27/2010  . ESOPHAGEAL SPASM 09/29/2010  . UNSPECIFIED ESSENTIAL HYPERTENSION 09/25/2010  . SINUS BRADYCARDIA 09/25/2010  . DIZZINESS 09/25/2010  . ANXIETY, SITUATIONAL 06/26/2010  . HYPERLIPIDEMIA 12/13/2009  . OSTEOPENIA 12/13/2009  . ELEVATED BP READING WITHOUT DX HYPERTENSION 12/13/2009    Past Surgical History:  Procedure Laterality Date  . ABDOMINAL HYSTERECTOMY     menorrhagia  . BUNIONECTOMY     both feet  . CARPAL TUNNEL RELEASE  10/13/2011   Procedure: CARPAL TUNNEL RELEASE;  Surgeon: Wynonia Sours, MD;  Location: [facility];  Service: Orthopedics;  Laterality: Right;  . EAR CYST EXCISION  10/13/2011   Procedure: CYST REMOVAL;  Surgeon: Wynonia Sours, MD;  Location: [facility];  Service: Orthopedics;  Laterality: Right;  excision cyst possible rotator flap, debridement dip right middle finger  . left knee arthroscopy    . right rotator cuff repair      Prior to Admission medications   Medication Sig Start Date End Date Taking? Authorizing Provider  aspirin 81 MG tablet Take 81 mg by mouth daily.     [provider]  clobetasol ointment (TEMOVATE) 2.09 % Apply 1 application topically 2 (two) times daily.    [provider]  Magnesium 400 MG CAPS Take 400 mg by mouth.    [provider]  pravastatin (PRAVACHOL) 20 MG tablet Take 1 tablet (20 mg total) by mouth at bedtime. 08/20/11   Lucille Passy, MD    Allergies Patient has no known allergies.  Family History  Problem Relation Age of Onset  . Heart failure Mother     Social History Social History   Tobacco Use  . Smoking status: Former Smoker    Last attempt to quit: 04/07/1991    Years since quitting: 26.7  . Smokeless tobacco: Never Used  Substance Use Topics  . Alcohol use: No  . Drug use: No    Review of Systems  Constitutional: No fever/chills Eyes: No visual changes. ENT: No sore throat. Cardiovascular: Denies chest pain. Respiratory: Denies shortness of breath. Gastrointestinal: No diarrhea.  No constipation. Genitourinary: Negative for dysuria. Musculoskeletal: As above Skin: Negative for rash. Neurological: Negative for headaches, focal weakness or numbness.   ____________________________________________   PHYSICAL EXAM:  VITAL SIGNS: ED Triage Vitals  Enc Vitals Group     BP 01/09/18 1425 (!) 137/53      Pulse Rate 01/09/18 1425 (!) 54     Resp 01/09/18 1425 18     Temp 01/09/18 1425 98.1 F (36.7 C)     Temp Source 01/09/18 1425 Oral     SpO2 01/09/18 1425 100 %     Weight 01/09/18 1428 168 lb (76.2 kg)     Height 01/09/18 1428 5\' 6"  (1.676 m)     Head Circumference --      Peak Flow --      Pain Score 01/09/18 1428 7     Pain Loc --      Pain Edu? --      Excl. in [place]? --     Constitutional: Alert and oriented. Well appearing and in no acute distress. Eyes: Conjunctivae are normal.  Head: Atraumatic. Nose: No congestion/rhinnorhea. Mouth/Throat: Mucous membranes are moist.  Neck: No stridor.   Cardiovascular: Normal rate, regular rhythm. Grossly normal heart sounds.   Respiratory: Normal respiratory effort.  No retractions. Lungs CTAB. Gastrointestinal: Soft with moderate tenderness to palpation across the upper abdomen.  Negative Murphy sign.  No rebound or guarding. No distention. No CVA tenderness. Musculoskeletal: No lower extremity tenderness nor edema.  No joint effusions. Neurologic:  Normal speech and language. No gross focal neurologic deficits are appreciated. Skin:  Skin is warm, dry and intact. No rash noted. Psychiatric: Mood and affect are normal. Speech and behavior are normal.  ____________________________________________   LABS (all labs ordered are listed, but only abnormal results are displayed)  Labs Reviewed  COMPREHENSIVE METABOLIC PANEL - Abnormal; Notable for the following components:      Result Value   Glucose, Bld 127 (*)    Albumin 3.4 (*)    AST 145 (*)    ALT 105 (*)    Total Bilirubin 2.0 (*)    All other components within normal limits  CBC - Abnormal; Notable for the following components:   Platelets 101 (*)    All other components within normal limits  URINALYSIS, COMPLETE (UACMP) WITH MICROSCOPIC - Abnormal; Notable for the following components:   Color, Urine AMBER (*)    APPearance CLOUDY (*)    Protein, ur 30 (*)    Leukocytes,  UA MODERATE (*)    Squamous Epithelial / LPF 6-30 (*)    Non Squamous Epithelial 0-5 (*)    All other components within normal limits  URINE CULTURE  LIPASE, BLOOD  TROPONIN I   ____________________________________________  EKG  ED ECG REPORT I, Doran Stabler, the attending physician, personally viewed and interpreted this ECG.   Date: 01/09/2018  EKG Time: 1429  Rate: 66  Rhythm: normal sinus rhythm  Axis: Normal  Intervals:LVH.   prolonged QTC at 501.  ST&T Change: No ST segment elevation or depression.  No abnormal T wave inversion.  ____________________________________________  RADIOLOGY  Mild interstitial edema with borderline cardiomegaly and aortic atherosclerosis on the chest x-ray. ____________________________________________   PROCEDURES  Procedure(s)  performed:   Procedures  Critical Care performed:   ____________________________________________   INITIAL IMPRESSION / ASSESSMENT AND PLAN / ED COURSE  Pertinent labs & imaging results that were available during my care of the patient were reviewed by me and considered in my medical decision making (see chart for details).  Differential diagnosis includes, but is not limited to, biliary disease (biliary colic, acute cholecystitis, cholangitis, choledocholithiasis, etc), intrathoracic causes for epigastric abdominal pain including ACS, gastritis, duodenitis, pancreatitis, small bowel or large bowel obstruction, abdominal aortic aneurysm, hernia, and gastritis. As part of my medical decision making, I reviewed the following data within the electronic MEDICAL RECORD NUMBER Notes from prior  visits   ----------------------------------------- 5:28 PM on 01/09/2018 -----------------------------------------  Patient at this time is nontender to the upper abdomen.  She is able to tolerate crackers as well as juice.  Says that she has belching now and her symptoms are improving with each develops.  She says also  that the GI cocktail did provide some relief.  She says that she takes at home ranitidine twice a day as needed.  I recommend that she start taking this medicine tonight.  She knows that she must return for any worsening or concerning symptoms.  Urinalysis with possible UTI but contaminated with squamous epithelial cells.  No urine symptoms noted by the patient.  Patient was also concerned because her husband was recently diagnosed with flu.  However, she says that her diarrhea along with cough.  The patient denies cough.  Denies any body aches or chills.  Feel the Tamiflu at this time with isolated GI symptoms likely do more harm than good as GI complications are known side effect of this medication.  Patient understanding the plan willing to comply.  Will discharge at this time.  I also discussed the chest x-ray with the patient.  The patient continues to deny any chest pain or shortness of breath.  Unclear significance of the interstitial edema.  She will follow with her primary care doctor.  Also, regarding patient's upper abdominal pain and elevated bilirubin.  The patient had an ultrasound of her abdomen this past October which did not show any stones.  Also with a negative Murphy sign and symptoms improving.     ____________________________________________   FINAL CLINICAL IMPRESSION(S) / ED DIAGNOSES  Upper abdominal pain.  Nausea and vomiting.    NEW MEDICATIONS STARTED DURING THIS VISIT:  New Prescriptions   No medications on file     Note:  This document was prepared using Dragon voice recognition software and may include unintentional dictation errors.     Orbie Pyo, MD 01/09/18 1729    Orbie Pyo, MD 01/09/18 1731    Bessye Stith, Randall An, MD 01/09/18 [phone]

## 2018-01-09 NOTE — ED Notes (Signed)

## 2018-01-09 NOTE — ED Triage Notes (Signed)
Pt presents to ED with c/o bilateral upper abdominal pain that started approx 1.5 hrs prior to arrival with radiation to her back. Pt's sister reports 1 episode of vomiting.

## 2018-01-11 LAB — URINE CULTURE

## 2018-01-24 DIAGNOSIS — N182 Chronic kidney disease, stage 2 (mild): Secondary | ICD-10-CM | POA: Diagnosis not present

## 2018-01-24 DIAGNOSIS — E785 Hyperlipidemia, unspecified: Secondary | ICD-10-CM | POA: Diagnosis not present

## 2018-01-31 DIAGNOSIS — D709 Neutropenia, unspecified: Secondary | ICD-10-CM | POA: Diagnosis not present

## 2018-01-31 DIAGNOSIS — R5383 Other fatigue: Secondary | ICD-10-CM | POA: Diagnosis not present

## 2018-01-31 DIAGNOSIS — E785 Hyperlipidemia, unspecified: Secondary | ICD-10-CM | POA: Diagnosis not present

## 2018-01-31 DIAGNOSIS — R011 Cardiac murmur, unspecified: Secondary | ICD-10-CM | POA: Diagnosis not present

## 2018-01-31 DIAGNOSIS — R42 Dizziness and giddiness: Secondary | ICD-10-CM | POA: Diagnosis not present

## 2018-01-31 DIAGNOSIS — J449 Chronic obstructive pulmonary disease, unspecified: Secondary | ICD-10-CM | POA: Diagnosis not present

## 2018-01-31 DIAGNOSIS — S0091XA Abrasion of unspecified part of head, initial encounter: Secondary | ICD-10-CM | POA: Diagnosis not present

## 2018-01-31 DIAGNOSIS — K760 Fatty (change of) liver, not elsewhere classified: Secondary | ICD-10-CM | POA: Diagnosis not present

## 2018-01-31 DIAGNOSIS — N182 Chronic kidney disease, stage 2 (mild): Secondary | ICD-10-CM | POA: Diagnosis not present

## 2018-01-31 DIAGNOSIS — D696 Thrombocytopenia, unspecified: Secondary | ICD-10-CM | POA: Diagnosis not present

## 2018-01-31 DIAGNOSIS — K219 Gastro-esophageal reflux disease without esophagitis: Secondary | ICD-10-CM | POA: Diagnosis not present

## 2018-01-31 DIAGNOSIS — F4321 Adjustment disorder with depressed mood: Secondary | ICD-10-CM | POA: Diagnosis not present

## 2018-02-10 DIAGNOSIS — R011 Cardiac murmur, unspecified: Secondary | ICD-10-CM | POA: Diagnosis not present

## 2018-04-15 DIAGNOSIS — D2272 Melanocytic nevi of left lower limb, including hip: Secondary | ICD-10-CM | POA: Diagnosis not present

## 2018-04-15 DIAGNOSIS — L538 Other specified erythematous conditions: Secondary | ICD-10-CM | POA: Diagnosis not present

## 2018-04-15 DIAGNOSIS — L821 Other seborrheic keratosis: Secondary | ICD-10-CM | POA: Diagnosis not present

## 2018-04-15 DIAGNOSIS — D2261 Melanocytic nevi of right upper limb, including shoulder: Secondary | ICD-10-CM | POA: Diagnosis not present

## 2018-04-15 DIAGNOSIS — D225 Melanocytic nevi of trunk: Secondary | ICD-10-CM | POA: Diagnosis not present

## 2018-04-15 DIAGNOSIS — L820 Inflamed seborrheic keratosis: Secondary | ICD-10-CM | POA: Diagnosis not present

## 2018-04-15 DIAGNOSIS — D2271 Melanocytic nevi of right lower limb, including hip: Secondary | ICD-10-CM | POA: Diagnosis not present

## 2018-04-15 DIAGNOSIS — D2262 Melanocytic nevi of left upper limb, including shoulder: Secondary | ICD-10-CM | POA: Diagnosis not present

## 2018-05-03 DIAGNOSIS — N390 Urinary tract infection, site not specified: Secondary | ICD-10-CM | POA: Diagnosis not present

## 2018-05-03 DIAGNOSIS — R300 Dysuria: Secondary | ICD-10-CM | POA: Diagnosis not present

## 2018-05-11 DIAGNOSIS — D125 Benign neoplasm of sigmoid colon: Secondary | ICD-10-CM | POA: Diagnosis not present

## 2018-05-11 DIAGNOSIS — Z86010 Personal history of colonic polyps: Secondary | ICD-10-CM | POA: Diagnosis not present

## 2018-05-11 DIAGNOSIS — D120 Benign neoplasm of cecum: Secondary | ICD-10-CM | POA: Diagnosis not present

## 2018-05-11 DIAGNOSIS — K621 Rectal polyp: Secondary | ICD-10-CM | POA: Diagnosis not present

## 2018-05-11 DIAGNOSIS — K635 Polyp of colon: Secondary | ICD-10-CM | POA: Diagnosis not present

## 2018-05-11 DIAGNOSIS — Z1211 Encounter for screening for malignant neoplasm of colon: Secondary | ICD-10-CM | POA: Diagnosis not present

## 2018-07-27 DIAGNOSIS — N182 Chronic kidney disease, stage 2 (mild): Secondary | ICD-10-CM | POA: Diagnosis not present

## 2018-07-27 DIAGNOSIS — D709 Neutropenia, unspecified: Secondary | ICD-10-CM | POA: Diagnosis not present

## 2018-07-27 DIAGNOSIS — E785 Hyperlipidemia, unspecified: Secondary | ICD-10-CM | POA: Diagnosis not present

## 2018-07-27 DIAGNOSIS — R1030 Lower abdominal pain, unspecified: Secondary | ICD-10-CM | POA: Diagnosis not present

## 2018-08-03 DIAGNOSIS — K760 Fatty (change of) liver, not elsewhere classified: Secondary | ICD-10-CM | POA: Diagnosis not present

## 2018-08-03 DIAGNOSIS — D696 Thrombocytopenia, unspecified: Secondary | ICD-10-CM | POA: Diagnosis not present

## 2018-08-03 DIAGNOSIS — D709 Neutropenia, unspecified: Secondary | ICD-10-CM | POA: Diagnosis not present

## 2018-08-03 DIAGNOSIS — R0981 Nasal congestion: Secondary | ICD-10-CM | POA: Diagnosis not present

## 2018-08-03 DIAGNOSIS — Z0000 Encounter for general adult medical examination without abnormal findings: Secondary | ICD-10-CM | POA: Diagnosis not present

## 2018-08-03 DIAGNOSIS — R040 Epistaxis: Secondary | ICD-10-CM | POA: Diagnosis not present

## 2018-08-12 DIAGNOSIS — Z1231 Encounter for screening mammogram for malignant neoplasm of breast: Secondary | ICD-10-CM | POA: Diagnosis not present

## 2018-11-05 DIAGNOSIS — R531 Weakness: Secondary | ICD-10-CM | POA: Diagnosis not present

## 2018-11-05 DIAGNOSIS — R0689 Other abnormalities of breathing: Secondary | ICD-10-CM | POA: Diagnosis not present

## 2018-11-05 DIAGNOSIS — N179 Acute kidney failure, unspecified: Secondary | ICD-10-CM | POA: Diagnosis not present

## 2018-11-05 DIAGNOSIS — J9600 Acute respiratory failure, unspecified whether with hypoxia or hypercapnia: Secondary | ICD-10-CM | POA: Diagnosis not present

## 2018-11-05 DIAGNOSIS — R52 Pain, unspecified: Secondary | ICD-10-CM | POA: Diagnosis not present

## 2018-11-05 DIAGNOSIS — R6521 Severe sepsis with septic shock: Secondary | ICD-10-CM | POA: Diagnosis not present

## 2018-11-05 DIAGNOSIS — J181 Lobar pneumonia, unspecified organism: Secondary | ICD-10-CM | POA: Diagnosis not present

## 2018-11-05 DIAGNOSIS — D696 Thrombocytopenia, unspecified: Secondary | ICD-10-CM | POA: Diagnosis not present

## 2018-11-05 DIAGNOSIS — R6520 Severe sepsis without septic shock: Secondary | ICD-10-CM | POA: Diagnosis not present

## 2018-11-05 DIAGNOSIS — R0602 Shortness of breath: Secondary | ICD-10-CM | POA: Diagnosis not present

## 2018-11-05 DIAGNOSIS — R748 Abnormal levels of other serum enzymes: Secondary | ICD-10-CM | POA: Diagnosis not present

## 2018-11-05 DIAGNOSIS — J189 Pneumonia, unspecified organism: Secondary | ICD-10-CM | POA: Diagnosis not present

## 2018-11-05 DIAGNOSIS — A419 Sepsis, unspecified organism: Secondary | ICD-10-CM | POA: Diagnosis not present

## 2018-11-05 DIAGNOSIS — I959 Hypotension, unspecified: Secondary | ICD-10-CM | POA: Diagnosis not present

## 2018-11-05 DIAGNOSIS — J9601 Acute respiratory failure with hypoxia: Secondary | ICD-10-CM | POA: Diagnosis not present

## 2018-11-05 DIAGNOSIS — R062 Wheezing: Secondary | ICD-10-CM | POA: Diagnosis not present

## 2018-11-05 DIAGNOSIS — R0902 Hypoxemia: Secondary | ICD-10-CM | POA: Diagnosis not present

## 2018-11-05 DIAGNOSIS — J90 Pleural effusion, not elsewhere classified: Secondary | ICD-10-CM | POA: Diagnosis not present

## 2018-11-06 DIAGNOSIS — D696 Thrombocytopenia, unspecified: Secondary | ICD-10-CM | POA: Diagnosis not present

## 2018-11-06 DIAGNOSIS — N179 Acute kidney failure, unspecified: Secondary | ICD-10-CM | POA: Diagnosis not present

## 2018-11-06 DIAGNOSIS — I348 Other nonrheumatic mitral valve disorders: Secondary | ICD-10-CM | POA: Diagnosis not present

## 2018-11-06 DIAGNOSIS — J189 Pneumonia, unspecified organism: Secondary | ICD-10-CM | POA: Diagnosis not present

## 2018-11-06 DIAGNOSIS — J9601 Acute respiratory failure with hypoxia: Secondary | ICD-10-CM | POA: Diagnosis not present

## 2018-11-06 DIAGNOSIS — R6521 Severe sepsis with septic shock: Secondary | ICD-10-CM | POA: Diagnosis not present

## 2018-11-06 DIAGNOSIS — I517 Cardiomegaly: Secondary | ICD-10-CM | POA: Diagnosis not present

## 2018-11-06 DIAGNOSIS — J181 Lobar pneumonia, unspecified organism: Secondary | ICD-10-CM | POA: Diagnosis not present

## 2018-11-06 DIAGNOSIS — A419 Sepsis, unspecified organism: Secondary | ICD-10-CM | POA: Diagnosis not present

## 2018-11-06 DIAGNOSIS — R748 Abnormal levels of other serum enzymes: Secondary | ICD-10-CM | POA: Diagnosis not present

## 2018-11-06 DIAGNOSIS — J9600 Acute respiratory failure, unspecified whether with hypoxia or hypercapnia: Secondary | ICD-10-CM | POA: Diagnosis not present

## 2018-11-07 DIAGNOSIS — R4182 Altered mental status, unspecified: Secondary | ICD-10-CM | POA: Diagnosis not present

## 2018-11-07 DIAGNOSIS — I491 Atrial premature depolarization: Secondary | ICD-10-CM | POA: Diagnosis not present

## 2018-11-07 DIAGNOSIS — R6521 Severe sepsis with septic shock: Secondary | ICD-10-CM | POA: Diagnosis not present

## 2018-11-07 DIAGNOSIS — J9601 Acute respiratory failure with hypoxia: Secondary | ICD-10-CM | POA: Diagnosis not present

## 2018-11-07 DIAGNOSIS — R9431 Abnormal electrocardiogram [ECG] [EKG]: Secondary | ICD-10-CM | POA: Diagnosis not present

## 2018-11-07 DIAGNOSIS — J189 Pneumonia, unspecified organism: Secondary | ICD-10-CM | POA: Diagnosis not present

## 2018-11-07 DIAGNOSIS — I517 Cardiomegaly: Secondary | ICD-10-CM | POA: Diagnosis not present

## 2018-11-07 DIAGNOSIS — N179 Acute kidney failure, unspecified: Secondary | ICD-10-CM | POA: Diagnosis not present

## 2018-11-07 DIAGNOSIS — R0902 Hypoxemia: Secondary | ICD-10-CM | POA: Diagnosis not present

## 2018-11-07 DIAGNOSIS — R748 Abnormal levels of other serum enzymes: Secondary | ICD-10-CM | POA: Diagnosis not present

## 2018-11-07 DIAGNOSIS — J9600 Acute respiratory failure, unspecified whether with hypoxia or hypercapnia: Secondary | ICD-10-CM | POA: Diagnosis not present

## 2018-11-07 DIAGNOSIS — J181 Lobar pneumonia, unspecified organism: Secondary | ICD-10-CM | POA: Diagnosis not present

## 2018-11-07 DIAGNOSIS — R000 Tachycardia, unspecified: Secondary | ICD-10-CM | POA: Diagnosis not present

## 2018-11-07 DIAGNOSIS — D696 Thrombocytopenia, unspecified: Secondary | ICD-10-CM | POA: Diagnosis not present

## 2018-11-07 DIAGNOSIS — A419 Sepsis, unspecified organism: Secondary | ICD-10-CM | POA: Diagnosis not present

## 2018-11-07 DIAGNOSIS — R112 Nausea with vomiting, unspecified: Secondary | ICD-10-CM | POA: Diagnosis not present

## 2018-11-08 DIAGNOSIS — J181 Lobar pneumonia, unspecified organism: Secondary | ICD-10-CM | POA: Diagnosis not present

## 2018-11-08 DIAGNOSIS — R748 Abnormal levels of other serum enzymes: Secondary | ICD-10-CM | POA: Diagnosis not present

## 2018-11-08 DIAGNOSIS — R6521 Severe sepsis with septic shock: Secondary | ICD-10-CM | POA: Diagnosis not present

## 2018-11-08 DIAGNOSIS — D696 Thrombocytopenia, unspecified: Secondary | ICD-10-CM | POA: Diagnosis not present

## 2018-11-08 DIAGNOSIS — J9600 Acute respiratory failure, unspecified whether with hypoxia or hypercapnia: Secondary | ICD-10-CM | POA: Diagnosis not present

## 2018-11-08 DIAGNOSIS — R4182 Altered mental status, unspecified: Secondary | ICD-10-CM | POA: Diagnosis not present

## 2018-11-08 DIAGNOSIS — N179 Acute kidney failure, unspecified: Secondary | ICD-10-CM | POA: Diagnosis not present

## 2018-11-08 DIAGNOSIS — J9601 Acute respiratory failure with hypoxia: Secondary | ICD-10-CM | POA: Diagnosis not present

## 2018-11-08 DIAGNOSIS — J189 Pneumonia, unspecified organism: Secondary | ICD-10-CM | POA: Diagnosis not present

## 2018-11-08 DIAGNOSIS — A419 Sepsis, unspecified organism: Secondary | ICD-10-CM | POA: Diagnosis not present

## 2018-11-08 DIAGNOSIS — Z4682 Encounter for fitting and adjustment of non-vascular catheter: Secondary | ICD-10-CM | POA: Diagnosis not present

## 2018-11-08 DIAGNOSIS — R112 Nausea with vomiting, unspecified: Secondary | ICD-10-CM | POA: Diagnosis not present

## 2018-11-09 DIAGNOSIS — J9600 Acute respiratory failure, unspecified whether with hypoxia or hypercapnia: Secondary | ICD-10-CM | POA: Diagnosis not present

## 2018-11-09 DIAGNOSIS — D696 Thrombocytopenia, unspecified: Secondary | ICD-10-CM | POA: Diagnosis not present

## 2018-11-09 DIAGNOSIS — J181 Lobar pneumonia, unspecified organism: Secondary | ICD-10-CM | POA: Diagnosis not present

## 2018-11-09 DIAGNOSIS — J9601 Acute respiratory failure with hypoxia: Secondary | ICD-10-CM | POA: Diagnosis not present

## 2018-11-09 DIAGNOSIS — A419 Sepsis, unspecified organism: Secondary | ICD-10-CM | POA: Diagnosis not present

## 2018-11-09 DIAGNOSIS — J189 Pneumonia, unspecified organism: Secondary | ICD-10-CM | POA: Diagnosis not present

## 2018-11-09 DIAGNOSIS — R6521 Severe sepsis with septic shock: Secondary | ICD-10-CM | POA: Diagnosis not present

## 2018-11-09 DIAGNOSIS — R748 Abnormal levels of other serum enzymes: Secondary | ICD-10-CM | POA: Diagnosis not present

## 2018-11-09 DIAGNOSIS — N179 Acute kidney failure, unspecified: Secondary | ICD-10-CM | POA: Diagnosis not present

## 2018-11-10 DIAGNOSIS — Z452 Encounter for adjustment and management of vascular access device: Secondary | ICD-10-CM | POA: Diagnosis not present

## 2018-11-10 DIAGNOSIS — R748 Abnormal levels of other serum enzymes: Secondary | ICD-10-CM | POA: Diagnosis not present

## 2018-11-10 DIAGNOSIS — J189 Pneumonia, unspecified organism: Secondary | ICD-10-CM | POA: Diagnosis not present

## 2018-11-10 DIAGNOSIS — N179 Acute kidney failure, unspecified: Secondary | ICD-10-CM | POA: Diagnosis not present

## 2018-11-10 DIAGNOSIS — A419 Sepsis, unspecified organism: Secondary | ICD-10-CM | POA: Diagnosis not present

## 2018-11-10 DIAGNOSIS — R6521 Severe sepsis with septic shock: Secondary | ICD-10-CM | POA: Diagnosis not present

## 2018-11-10 DIAGNOSIS — J9601 Acute respiratory failure with hypoxia: Secondary | ICD-10-CM | POA: Diagnosis not present

## 2018-11-10 DIAGNOSIS — D696 Thrombocytopenia, unspecified: Secondary | ICD-10-CM | POA: Diagnosis not present

## 2018-11-11 DIAGNOSIS — R748 Abnormal levels of other serum enzymes: Secondary | ICD-10-CM | POA: Diagnosis not present

## 2018-11-11 DIAGNOSIS — A419 Sepsis, unspecified organism: Secondary | ICD-10-CM | POA: Diagnosis not present

## 2018-11-11 DIAGNOSIS — J189 Pneumonia, unspecified organism: Secondary | ICD-10-CM | POA: Diagnosis not present

## 2018-11-11 DIAGNOSIS — R6521 Severe sepsis with septic shock: Secondary | ICD-10-CM | POA: Diagnosis not present

## 2018-11-11 DIAGNOSIS — N179 Acute kidney failure, unspecified: Secondary | ICD-10-CM | POA: Diagnosis not present

## 2018-11-11 DIAGNOSIS — J9601 Acute respiratory failure with hypoxia: Secondary | ICD-10-CM | POA: Diagnosis not present

## 2018-11-11 DIAGNOSIS — D696 Thrombocytopenia, unspecified: Secondary | ICD-10-CM | POA: Diagnosis not present

## 2018-11-12 DIAGNOSIS — N179 Acute kidney failure, unspecified: Secondary | ICD-10-CM | POA: Diagnosis not present

## 2018-11-12 DIAGNOSIS — J189 Pneumonia, unspecified organism: Secondary | ICD-10-CM | POA: Diagnosis not present

## 2018-11-12 DIAGNOSIS — D696 Thrombocytopenia, unspecified: Secondary | ICD-10-CM | POA: Diagnosis not present

## 2018-11-12 DIAGNOSIS — R6521 Severe sepsis with septic shock: Secondary | ICD-10-CM | POA: Diagnosis not present

## 2018-11-12 DIAGNOSIS — G9341 Metabolic encephalopathy: Secondary | ICD-10-CM | POA: Diagnosis not present

## 2018-11-12 DIAGNOSIS — J181 Lobar pneumonia, unspecified organism: Secondary | ICD-10-CM | POA: Diagnosis not present

## 2018-11-12 DIAGNOSIS — J9600 Acute respiratory failure, unspecified whether with hypoxia or hypercapnia: Secondary | ICD-10-CM | POA: Diagnosis not present

## 2018-11-12 DIAGNOSIS — E8770 Fluid overload, unspecified: Secondary | ICD-10-CM | POA: Diagnosis not present

## 2018-11-12 DIAGNOSIS — J9601 Acute respiratory failure with hypoxia: Secondary | ICD-10-CM | POA: Diagnosis not present

## 2018-11-12 DIAGNOSIS — J154 Pneumonia due to other streptococci: Secondary | ICD-10-CM | POA: Diagnosis not present

## 2018-11-12 DIAGNOSIS — E870 Hyperosmolality and hypernatremia: Secondary | ICD-10-CM | POA: Diagnosis not present

## 2018-11-12 DIAGNOSIS — A419 Sepsis, unspecified organism: Secondary | ICD-10-CM | POA: Diagnosis not present

## 2018-11-13 DIAGNOSIS — G9341 Metabolic encephalopathy: Secondary | ICD-10-CM | POA: Diagnosis not present

## 2018-11-13 DIAGNOSIS — N179 Acute kidney failure, unspecified: Secondary | ICD-10-CM | POA: Diagnosis not present

## 2018-11-13 DIAGNOSIS — A419 Sepsis, unspecified organism: Secondary | ICD-10-CM | POA: Diagnosis not present

## 2018-11-13 DIAGNOSIS — D696 Thrombocytopenia, unspecified: Secondary | ICD-10-CM | POA: Diagnosis not present

## 2018-11-13 DIAGNOSIS — J189 Pneumonia, unspecified organism: Secondary | ICD-10-CM | POA: Diagnosis not present

## 2018-11-13 DIAGNOSIS — J9601 Acute respiratory failure with hypoxia: Secondary | ICD-10-CM | POA: Diagnosis not present

## 2018-11-13 DIAGNOSIS — J9600 Acute respiratory failure, unspecified whether with hypoxia or hypercapnia: Secondary | ICD-10-CM | POA: Diagnosis not present

## 2018-11-13 DIAGNOSIS — J181 Lobar pneumonia, unspecified organism: Secondary | ICD-10-CM | POA: Diagnosis not present

## 2018-11-13 DIAGNOSIS — R6521 Severe sepsis with septic shock: Secondary | ICD-10-CM | POA: Diagnosis not present

## 2018-11-13 DIAGNOSIS — E870 Hyperosmolality and hypernatremia: Secondary | ICD-10-CM | POA: Diagnosis not present

## 2018-11-13 DIAGNOSIS — E8770 Fluid overload, unspecified: Secondary | ICD-10-CM | POA: Diagnosis not present

## 2018-11-13 DIAGNOSIS — J154 Pneumonia due to other streptococci: Secondary | ICD-10-CM | POA: Diagnosis not present

## 2018-11-14 DIAGNOSIS — D696 Thrombocytopenia, unspecified: Secondary | ICD-10-CM | POA: Diagnosis not present

## 2018-11-14 DIAGNOSIS — R6521 Severe sepsis with septic shock: Secondary | ICD-10-CM | POA: Diagnosis not present

## 2018-11-14 DIAGNOSIS — E8770 Fluid overload, unspecified: Secondary | ICD-10-CM | POA: Diagnosis not present

## 2018-11-14 DIAGNOSIS — J9600 Acute respiratory failure, unspecified whether with hypoxia or hypercapnia: Secondary | ICD-10-CM | POA: Diagnosis not present

## 2018-11-14 DIAGNOSIS — J181 Lobar pneumonia, unspecified organism: Secondary | ICD-10-CM | POA: Diagnosis not present

## 2018-11-14 DIAGNOSIS — J154 Pneumonia due to other streptococci: Secondary | ICD-10-CM | POA: Diagnosis not present

## 2018-11-14 DIAGNOSIS — G9341 Metabolic encephalopathy: Secondary | ICD-10-CM | POA: Diagnosis not present

## 2018-11-14 DIAGNOSIS — A419 Sepsis, unspecified organism: Secondary | ICD-10-CM | POA: Diagnosis not present

## 2018-11-14 DIAGNOSIS — J9601 Acute respiratory failure with hypoxia: Secondary | ICD-10-CM | POA: Diagnosis not present

## 2018-11-14 DIAGNOSIS — E870 Hyperosmolality and hypernatremia: Secondary | ICD-10-CM | POA: Diagnosis not present

## 2018-11-14 DIAGNOSIS — N179 Acute kidney failure, unspecified: Secondary | ICD-10-CM | POA: Diagnosis not present

## 2018-11-14 DIAGNOSIS — J189 Pneumonia, unspecified organism: Secondary | ICD-10-CM | POA: Diagnosis not present

## 2018-11-15 DIAGNOSIS — N179 Acute kidney failure, unspecified: Secondary | ICD-10-CM | POA: Diagnosis not present

## 2018-11-15 DIAGNOSIS — J9601 Acute respiratory failure with hypoxia: Secondary | ICD-10-CM | POA: Diagnosis not present

## 2018-11-15 DIAGNOSIS — J189 Pneumonia, unspecified organism: Secondary | ICD-10-CM | POA: Diagnosis not present

## 2018-11-15 DIAGNOSIS — R6521 Severe sepsis with septic shock: Secondary | ICD-10-CM | POA: Diagnosis not present

## 2018-11-15 DIAGNOSIS — D696 Thrombocytopenia, unspecified: Secondary | ICD-10-CM | POA: Diagnosis not present

## 2018-11-16 DIAGNOSIS — N179 Acute kidney failure, unspecified: Secondary | ICD-10-CM | POA: Diagnosis not present

## 2018-11-16 DIAGNOSIS — A419 Sepsis, unspecified organism: Secondary | ICD-10-CM | POA: Diagnosis not present

## 2018-11-16 DIAGNOSIS — J9600 Acute respiratory failure, unspecified whether with hypoxia or hypercapnia: Secondary | ICD-10-CM | POA: Diagnosis not present

## 2018-11-16 DIAGNOSIS — J9601 Acute respiratory failure with hypoxia: Secondary | ICD-10-CM | POA: Diagnosis not present

## 2018-11-16 DIAGNOSIS — J189 Pneumonia, unspecified organism: Secondary | ICD-10-CM | POA: Diagnosis not present

## 2018-11-16 DIAGNOSIS — E870 Hyperosmolality and hypernatremia: Secondary | ICD-10-CM | POA: Diagnosis not present

## 2018-11-16 DIAGNOSIS — R6521 Severe sepsis with septic shock: Secondary | ICD-10-CM | POA: Diagnosis not present

## 2018-11-16 DIAGNOSIS — G9341 Metabolic encephalopathy: Secondary | ICD-10-CM | POA: Diagnosis not present

## 2018-11-16 DIAGNOSIS — J181 Lobar pneumonia, unspecified organism: Secondary | ICD-10-CM | POA: Diagnosis not present

## 2018-11-16 DIAGNOSIS — D696 Thrombocytopenia, unspecified: Secondary | ICD-10-CM | POA: Diagnosis not present

## 2018-11-16 DIAGNOSIS — E8770 Fluid overload, unspecified: Secondary | ICD-10-CM | POA: Diagnosis not present

## 2018-11-16 DIAGNOSIS — J154 Pneumonia due to other streptococci: Secondary | ICD-10-CM | POA: Diagnosis not present

## 2018-11-16 DIAGNOSIS — K828 Other specified diseases of gallbladder: Secondary | ICD-10-CM | POA: Diagnosis not present

## 2018-11-17 DIAGNOSIS — R6521 Severe sepsis with septic shock: Secondary | ICD-10-CM | POA: Diagnosis not present

## 2018-11-17 DIAGNOSIS — J181 Lobar pneumonia, unspecified organism: Secondary | ICD-10-CM | POA: Diagnosis not present

## 2018-11-17 DIAGNOSIS — E870 Hyperosmolality and hypernatremia: Secondary | ICD-10-CM | POA: Diagnosis not present

## 2018-11-17 DIAGNOSIS — J9600 Acute respiratory failure, unspecified whether with hypoxia or hypercapnia: Secondary | ICD-10-CM | POA: Diagnosis not present

## 2018-11-17 DIAGNOSIS — J9601 Acute respiratory failure with hypoxia: Secondary | ICD-10-CM | POA: Diagnosis not present

## 2018-11-17 DIAGNOSIS — E8770 Fluid overload, unspecified: Secondary | ICD-10-CM | POA: Diagnosis not present

## 2018-11-17 DIAGNOSIS — N179 Acute kidney failure, unspecified: Secondary | ICD-10-CM | POA: Diagnosis not present

## 2018-11-17 DIAGNOSIS — J189 Pneumonia, unspecified organism: Secondary | ICD-10-CM | POA: Diagnosis not present

## 2018-11-17 DIAGNOSIS — G9341 Metabolic encephalopathy: Secondary | ICD-10-CM | POA: Diagnosis not present

## 2018-11-17 DIAGNOSIS — D696 Thrombocytopenia, unspecified: Secondary | ICD-10-CM | POA: Diagnosis not present

## 2018-11-17 DIAGNOSIS — A419 Sepsis, unspecified organism: Secondary | ICD-10-CM | POA: Diagnosis not present

## 2018-11-17 DIAGNOSIS — J154 Pneumonia due to other streptococci: Secondary | ICD-10-CM | POA: Diagnosis not present

## 2018-11-18 DIAGNOSIS — N179 Acute kidney failure, unspecified: Secondary | ICD-10-CM | POA: Diagnosis not present

## 2018-11-18 DIAGNOSIS — R1310 Dysphagia, unspecified: Secondary | ICD-10-CM | POA: Diagnosis not present

## 2018-11-18 DIAGNOSIS — J189 Pneumonia, unspecified organism: Secondary | ICD-10-CM | POA: Diagnosis not present

## 2018-11-18 DIAGNOSIS — D696 Thrombocytopenia, unspecified: Secondary | ICD-10-CM | POA: Diagnosis not present

## 2018-11-18 DIAGNOSIS — R05 Cough: Secondary | ICD-10-CM | POA: Diagnosis not present

## 2018-11-18 DIAGNOSIS — R6521 Severe sepsis with septic shock: Secondary | ICD-10-CM | POA: Diagnosis not present

## 2018-11-18 DIAGNOSIS — J9601 Acute respiratory failure with hypoxia: Secondary | ICD-10-CM | POA: Diagnosis not present

## 2018-11-18 DIAGNOSIS — T17398A Other foreign object in larynx causing other injury, initial encounter: Secondary | ICD-10-CM | POA: Diagnosis not present

## 2018-11-19 DIAGNOSIS — J189 Pneumonia, unspecified organism: Secondary | ICD-10-CM | POA: Diagnosis not present

## 2018-11-19 DIAGNOSIS — N179 Acute kidney failure, unspecified: Secondary | ICD-10-CM | POA: Diagnosis not present

## 2018-11-19 DIAGNOSIS — R6521 Severe sepsis with septic shock: Secondary | ICD-10-CM | POA: Diagnosis not present

## 2018-11-19 DIAGNOSIS — J9601 Acute respiratory failure with hypoxia: Secondary | ICD-10-CM | POA: Diagnosis not present

## 2018-11-19 DIAGNOSIS — D696 Thrombocytopenia, unspecified: Secondary | ICD-10-CM | POA: Diagnosis not present

## 2018-11-20 DIAGNOSIS — J9601 Acute respiratory failure with hypoxia: Secondary | ICD-10-CM | POA: Diagnosis not present

## 2018-11-20 DIAGNOSIS — R6521 Severe sepsis with septic shock: Secondary | ICD-10-CM | POA: Diagnosis not present

## 2018-11-20 DIAGNOSIS — D696 Thrombocytopenia, unspecified: Secondary | ICD-10-CM | POA: Diagnosis not present

## 2018-11-20 DIAGNOSIS — J189 Pneumonia, unspecified organism: Secondary | ICD-10-CM | POA: Diagnosis not present

## 2018-11-20 DIAGNOSIS — N179 Acute kidney failure, unspecified: Secondary | ICD-10-CM | POA: Diagnosis not present

## 2018-11-21 DIAGNOSIS — R6521 Severe sepsis with septic shock: Secondary | ICD-10-CM | POA: Diagnosis not present

## 2018-11-21 DIAGNOSIS — J189 Pneumonia, unspecified organism: Secondary | ICD-10-CM | POA: Diagnosis not present

## 2018-11-21 DIAGNOSIS — J9601 Acute respiratory failure with hypoxia: Secondary | ICD-10-CM | POA: Diagnosis not present

## 2018-11-21 DIAGNOSIS — D696 Thrombocytopenia, unspecified: Secondary | ICD-10-CM | POA: Diagnosis not present

## 2018-11-21 DIAGNOSIS — N179 Acute kidney failure, unspecified: Secondary | ICD-10-CM | POA: Diagnosis not present

## 2018-11-22 DIAGNOSIS — D696 Thrombocytopenia, unspecified: Secondary | ICD-10-CM | POA: Diagnosis not present

## 2018-11-22 DIAGNOSIS — N179 Acute kidney failure, unspecified: Secondary | ICD-10-CM | POA: Diagnosis not present

## 2018-11-22 DIAGNOSIS — J9601 Acute respiratory failure with hypoxia: Secondary | ICD-10-CM | POA: Diagnosis not present

## 2018-11-22 DIAGNOSIS — E876 Hypokalemia: Secondary | ICD-10-CM | POA: Diagnosis not present

## 2018-11-22 DIAGNOSIS — J189 Pneumonia, unspecified organism: Secondary | ICD-10-CM | POA: Diagnosis not present

## 2018-11-22 DIAGNOSIS — R6521 Severe sepsis with septic shock: Secondary | ICD-10-CM | POA: Diagnosis not present

## 2018-11-22 DIAGNOSIS — R748 Abnormal levels of other serum enzymes: Secondary | ICD-10-CM | POA: Diagnosis not present

## 2018-11-22 DIAGNOSIS — R5381 Other malaise: Secondary | ICD-10-CM | POA: Diagnosis not present

## 2018-11-23 DIAGNOSIS — D649 Anemia, unspecified: Secondary | ICD-10-CM | POA: Diagnosis not present

## 2018-11-23 DIAGNOSIS — I10 Essential (primary) hypertension: Secondary | ICD-10-CM | POA: Diagnosis not present

## 2018-11-23 DIAGNOSIS — R55 Syncope and collapse: Secondary | ICD-10-CM | POA: Diagnosis not present

## 2018-11-23 DIAGNOSIS — R6521 Severe sepsis with septic shock: Secondary | ICD-10-CM | POA: Diagnosis not present

## 2018-11-23 DIAGNOSIS — R278 Other lack of coordination: Secondary | ICD-10-CM | POA: Diagnosis not present

## 2018-11-23 DIAGNOSIS — R5381 Other malaise: Secondary | ICD-10-CM | POA: Diagnosis not present

## 2018-11-23 DIAGNOSIS — R748 Abnormal levels of other serum enzymes: Secondary | ICD-10-CM | POA: Diagnosis not present

## 2018-11-23 DIAGNOSIS — R41841 Cognitive communication deficit: Secondary | ICD-10-CM | POA: Diagnosis not present

## 2018-11-23 DIAGNOSIS — I959 Hypotension, unspecified: Secondary | ICD-10-CM | POA: Diagnosis not present

## 2018-11-23 DIAGNOSIS — E876 Hypokalemia: Secondary | ICD-10-CM | POA: Diagnosis not present

## 2018-11-23 DIAGNOSIS — D899 Disorder involving the immune mechanism, unspecified: Secondary | ICD-10-CM | POA: Diagnosis not present

## 2018-11-23 DIAGNOSIS — J99 Respiratory disorders in diseases classified elsewhere: Secondary | ICD-10-CM | POA: Diagnosis not present

## 2018-11-23 DIAGNOSIS — D696 Thrombocytopenia, unspecified: Secondary | ICD-10-CM | POA: Diagnosis not present

## 2018-11-23 DIAGNOSIS — R1312 Dysphagia, oropharyngeal phase: Secondary | ICD-10-CM | POA: Diagnosis not present

## 2018-11-23 DIAGNOSIS — M810 Age-related osteoporosis without current pathological fracture: Secondary | ICD-10-CM | POA: Diagnosis not present

## 2018-11-23 DIAGNOSIS — M7989 Other specified soft tissue disorders: Secondary | ICD-10-CM | POA: Diagnosis not present

## 2018-11-23 DIAGNOSIS — J189 Pneumonia, unspecified organism: Secondary | ICD-10-CM | POA: Diagnosis not present

## 2018-11-23 DIAGNOSIS — I119 Hypertensive heart disease without heart failure: Secondary | ICD-10-CM | POA: Diagnosis not present

## 2018-11-23 DIAGNOSIS — A403 Sepsis due to Streptococcus pneumoniae: Secondary | ICD-10-CM | POA: Diagnosis not present

## 2018-11-23 DIAGNOSIS — J9601 Acute respiratory failure with hypoxia: Secondary | ICD-10-CM | POA: Diagnosis not present

## 2018-11-23 DIAGNOSIS — M6281 Muscle weakness (generalized): Secondary | ICD-10-CM | POA: Diagnosis not present

## 2018-11-23 DIAGNOSIS — N179 Acute kidney failure, unspecified: Secondary | ICD-10-CM | POA: Diagnosis not present

## 2018-11-23 DIAGNOSIS — Z7401 Bed confinement status: Secondary | ICD-10-CM | POA: Diagnosis not present

## 2018-11-23 DIAGNOSIS — J690 Pneumonitis due to inhalation of food and vomit: Secondary | ICD-10-CM | POA: Diagnosis not present

## 2018-11-23 DIAGNOSIS — R2689 Other abnormalities of gait and mobility: Secondary | ICD-10-CM | POA: Diagnosis not present

## 2018-11-23 DIAGNOSIS — R21 Rash and other nonspecific skin eruption: Secondary | ICD-10-CM | POA: Diagnosis not present

## 2018-11-23 DIAGNOSIS — J9691 Respiratory failure, unspecified with hypoxia: Secondary | ICD-10-CM | POA: Diagnosis not present

## 2018-11-23 DIAGNOSIS — K5900 Constipation, unspecified: Secondary | ICD-10-CM | POA: Diagnosis not present

## 2018-11-23 DIAGNOSIS — J13 Pneumonia due to Streptococcus pneumoniae: Secondary | ICD-10-CM | POA: Diagnosis not present

## 2018-11-24 DIAGNOSIS — D649 Anemia, unspecified: Secondary | ICD-10-CM | POA: Diagnosis not present

## 2018-11-24 DIAGNOSIS — A403 Sepsis due to Streptococcus pneumoniae: Secondary | ICD-10-CM | POA: Diagnosis not present

## 2018-11-24 DIAGNOSIS — J13 Pneumonia due to Streptococcus pneumoniae: Secondary | ICD-10-CM | POA: Diagnosis not present

## 2018-11-24 DIAGNOSIS — J690 Pneumonitis due to inhalation of food and vomit: Secondary | ICD-10-CM | POA: Diagnosis not present

## 2018-12-01 DIAGNOSIS — R21 Rash and other nonspecific skin eruption: Secondary | ICD-10-CM | POA: Diagnosis not present

## 2018-12-05 DIAGNOSIS — I10 Essential (primary) hypertension: Secondary | ICD-10-CM | POA: Diagnosis not present

## 2018-12-05 DIAGNOSIS — M7989 Other specified soft tissue disorders: Secondary | ICD-10-CM | POA: Diagnosis not present

## 2018-12-05 DIAGNOSIS — N179 Acute kidney failure, unspecified: Secondary | ICD-10-CM | POA: Diagnosis not present

## 2018-12-12 DIAGNOSIS — N179 Acute kidney failure, unspecified: Secondary | ICD-10-CM | POA: Diagnosis not present

## 2018-12-12 DIAGNOSIS — I10 Essential (primary) hypertension: Secondary | ICD-10-CM | POA: Diagnosis not present

## 2018-12-12 DIAGNOSIS — K5900 Constipation, unspecified: Secondary | ICD-10-CM | POA: Diagnosis not present

## 2018-12-12 DIAGNOSIS — M7989 Other specified soft tissue disorders: Secondary | ICD-10-CM | POA: Diagnosis not present

## 2018-12-13 DIAGNOSIS — I10 Essential (primary) hypertension: Secondary | ICD-10-CM | POA: Diagnosis not present

## 2018-12-13 DIAGNOSIS — M7989 Other specified soft tissue disorders: Secondary | ICD-10-CM | POA: Diagnosis not present

## 2018-12-14 DIAGNOSIS — A403 Sepsis due to Streptococcus pneumoniae: Secondary | ICD-10-CM | POA: Diagnosis not present

## 2018-12-14 DIAGNOSIS — J690 Pneumonitis due to inhalation of food and vomit: Secondary | ICD-10-CM | POA: Diagnosis not present

## 2018-12-14 DIAGNOSIS — J13 Pneumonia due to Streptococcus pneumoniae: Secondary | ICD-10-CM | POA: Diagnosis not present

## 2018-12-14 DIAGNOSIS — N179 Acute kidney failure, unspecified: Secondary | ICD-10-CM | POA: Diagnosis not present

## 2018-12-15 ENCOUNTER — Emergency Department (HOSPITAL_COMMUNITY): Payer: Medicare Other

## 2018-12-15 ENCOUNTER — Other Ambulatory Visit: Payer: Self-pay

## 2018-12-15 ENCOUNTER — Encounter (HOSPITAL_COMMUNITY): Payer: Self-pay | Admitting: Emergency Medicine

## 2018-12-15 ENCOUNTER — Inpatient Hospital Stay (HOSPITAL_COMMUNITY)
Admission: EM | Admit: 2018-12-15 | Discharge: 2018-12-22 | DRG: 432 | Disposition: A | Discharge status: Home Health | Payer: Medicare Other | Attending: Internal Medicine | Admitting: Internal Medicine

## 2018-12-15 DIAGNOSIS — Z8701 Personal history of pneumonia (recurrent): Secondary | ICD-10-CM

## 2018-12-15 DIAGNOSIS — E785 Hyperlipidemia, unspecified: Secondary | ICD-10-CM | POA: Diagnosis present

## 2018-12-15 DIAGNOSIS — K7031 Alcoholic cirrhosis of liver with ascites: Secondary | ICD-10-CM | POA: Diagnosis not present

## 2018-12-15 DIAGNOSIS — F1010 Alcohol abuse, uncomplicated: Secondary | ICD-10-CM | POA: Diagnosis present

## 2018-12-15 DIAGNOSIS — Z87891 Personal history of nicotine dependence: Secondary | ICD-10-CM

## 2018-12-15 DIAGNOSIS — E876 Hypokalemia: Secondary | ICD-10-CM | POA: Diagnosis present

## 2018-12-15 DIAGNOSIS — E8770 Fluid overload, unspecified: Secondary | ICD-10-CM | POA: Diagnosis present

## 2018-12-15 DIAGNOSIS — D62 Acute posthemorrhagic anemia: Secondary | ICD-10-CM | POA: Diagnosis present

## 2018-12-15 DIAGNOSIS — B952 Enterococcus as the cause of diseases classified elsewhere: Secondary | ICD-10-CM | POA: Diagnosis present

## 2018-12-15 DIAGNOSIS — D649 Anemia, unspecified: Secondary | ICD-10-CM | POA: Diagnosis present

## 2018-12-15 DIAGNOSIS — I10 Essential (primary) hypertension: Secondary | ICD-10-CM | POA: Diagnosis present

## 2018-12-15 DIAGNOSIS — B9789 Other viral agents as the cause of diseases classified elsewhere: Secondary | ICD-10-CM | POA: Diagnosis present

## 2018-12-15 DIAGNOSIS — K760 Fatty (change of) liver, not elsewhere classified: Secondary | ICD-10-CM | POA: Diagnosis present

## 2018-12-15 DIAGNOSIS — R7989 Other specified abnormal findings of blood chemistry: Secondary | ICD-10-CM | POA: Diagnosis present

## 2018-12-15 DIAGNOSIS — C8351 Lymphoblastic (diffuse) lymphoma, lymph nodes of head, face, and neck: Secondary | ICD-10-CM | POA: Diagnosis not present

## 2018-12-15 DIAGNOSIS — R0602 Shortness of breath: Secondary | ICD-10-CM | POA: Diagnosis not present

## 2018-12-15 DIAGNOSIS — K5900 Constipation, unspecified: Secondary | ICD-10-CM | POA: Diagnosis not present

## 2018-12-15 DIAGNOSIS — R5381 Other malaise: Secondary | ICD-10-CM | POA: Diagnosis not present

## 2018-12-15 DIAGNOSIS — J069 Acute upper respiratory infection, unspecified: Secondary | ICD-10-CM | POA: Diagnosis present

## 2018-12-15 DIAGNOSIS — K7460 Unspecified cirrhosis of liver: Secondary | ICD-10-CM | POA: Diagnosis present

## 2018-12-15 DIAGNOSIS — E43 Unspecified severe protein-calorie malnutrition: Secondary | ICD-10-CM | POA: Diagnosis present

## 2018-12-15 DIAGNOSIS — Z90710 Acquired absence of both cervix and uterus: Secondary | ICD-10-CM

## 2018-12-15 DIAGNOSIS — R601 Generalized edema: Secondary | ICD-10-CM | POA: Diagnosis not present

## 2018-12-15 DIAGNOSIS — Z6831 Body mass index (BMI) 31.0-31.9, adult: Secondary | ICD-10-CM

## 2018-12-15 DIAGNOSIS — J209 Acute bronchitis, unspecified: Secondary | ICD-10-CM | POA: Diagnosis present

## 2018-12-15 DIAGNOSIS — D696 Thrombocytopenia, unspecified: Secondary | ICD-10-CM | POA: Diagnosis present

## 2018-12-15 DIAGNOSIS — Z8249 Family history of ischemic heart disease and other diseases of the circulatory system: Secondary | ICD-10-CM

## 2018-12-15 DIAGNOSIS — R0902 Hypoxemia: Secondary | ICD-10-CM | POA: Diagnosis present

## 2018-12-15 DIAGNOSIS — Z7982 Long term (current) use of aspirin: Secondary | ICD-10-CM

## 2018-12-15 DIAGNOSIS — N390 Urinary tract infection, site not specified: Secondary | ICD-10-CM | POA: Diagnosis present

## 2018-12-15 DIAGNOSIS — R945 Abnormal results of liver function studies: Secondary | ICD-10-CM | POA: Diagnosis present

## 2018-12-15 DIAGNOSIS — R188 Other ascites: Secondary | ICD-10-CM | POA: Diagnosis not present

## 2018-12-15 DIAGNOSIS — F1020 Alcohol dependence, uncomplicated: Secondary | ICD-10-CM | POA: Diagnosis present

## 2018-12-15 DIAGNOSIS — Z79899 Other long term (current) drug therapy: Secondary | ICD-10-CM

## 2018-12-15 DIAGNOSIS — K921 Melena: Secondary | ICD-10-CM | POA: Diagnosis present

## 2018-12-15 HISTORY — DX: Alcoholic cirrhosis of liver without ascites: K70.30

## 2018-12-15 HISTORY — DX: Alcohol dependence, uncomplicated: F10.20

## 2018-12-15 LAB — BASIC METABOLIC PANEL
Anion gap: 10 (ref 5–15)
BUN: 11 mg/dL (ref 8–23)
CO2: 24 mmol/L (ref 22–32)
Calcium: 8 mg/dL — ABNORMAL LOW (ref 8.9–10.3)
Chloride: 98 mmol/L (ref 98–111)
Creatinine, Ser: 0.83 mg/dL (ref 0.44–1.00)
GFR calc Af Amer: >60 mL/min (ref >60)
GFR calc non Af Amer: >60 mL/min (ref >60)
Glucose, Bld: 111 mg/dL — ABNORMAL HIGH (ref 70–99)
Potassium: 2.8 mmol/L — ABNORMAL LOW (ref 3.5–5.1)
Sodium: 132 mmol/L — ABNORMAL LOW (ref 135–145)

## 2018-12-15 LAB — CBC
HCT: 29.1 % — ABNORMAL LOW (ref 36.0–46.0)
Hemoglobin: 10 g/dL — ABNORMAL LOW (ref 12.0–15.0)
MCH: 33.4 pg (ref 26.0–34.0)
MCHC: 34.4 g/dL (ref 30.0–36.0)
MCV: 97.3 fL (ref 80.0–100.0)
Platelets: 116 10*3/uL — ABNORMAL LOW (ref 150–400)
RBC: 2.99 MIL/uL — ABNORMAL LOW (ref 3.87–5.11)
RDW: 17.8 % — ABNORMAL HIGH (ref 11.5–15.5)
WBC: 8 10*3/uL (ref 4.0–10.5)
nRBC: 0 % (ref 0.0–0.2)

## 2018-12-15 LAB — I-STAT TROPONIN, ED: Troponin i, poc: 0.01 ng/mL (ref 0.00–0.08)

## 2018-12-15 NOTE — ED Triage Notes (Signed)
Pt states she has gained 30 lbs in swelling in the last few weeks. Pt abdomen and legs appear swollen. Pt states she has SOB with exertion.

## 2018-12-16 ENCOUNTER — Observation Stay (HOSPITAL_COMMUNITY): Payer: Medicare Other

## 2018-12-16 ENCOUNTER — Emergency Department (HOSPITAL_COMMUNITY): Payer: Medicare Other

## 2018-12-16 DIAGNOSIS — R601 Generalized edema: Secondary | ICD-10-CM | POA: Diagnosis present

## 2018-12-16 DIAGNOSIS — K7460 Unspecified cirrhosis of liver: Secondary | ICD-10-CM | POA: Insufficient documentation

## 2018-12-16 DIAGNOSIS — D649 Anemia, unspecified: Secondary | ICD-10-CM

## 2018-12-16 DIAGNOSIS — R945 Abnormal results of liver function studies: Secondary | ICD-10-CM | POA: Diagnosis not present

## 2018-12-16 DIAGNOSIS — K7031 Alcoholic cirrhosis of liver with ascites: Secondary | ICD-10-CM | POA: Diagnosis not present

## 2018-12-16 DIAGNOSIS — R7989 Other specified abnormal findings of blood chemistry: Secondary | ICD-10-CM | POA: Diagnosis present

## 2018-12-16 DIAGNOSIS — F1010 Alcohol abuse, uncomplicated: Secondary | ICD-10-CM | POA: Diagnosis not present

## 2018-12-16 DIAGNOSIS — R188 Other ascites: Secondary | ICD-10-CM | POA: Diagnosis not present

## 2018-12-16 DIAGNOSIS — E876 Hypokalemia: Secondary | ICD-10-CM

## 2018-12-16 DIAGNOSIS — D696 Thrombocytopenia, unspecified: Secondary | ICD-10-CM | POA: Diagnosis present

## 2018-12-16 DIAGNOSIS — E785 Hyperlipidemia, unspecified: Secondary | ICD-10-CM

## 2018-12-16 DIAGNOSIS — I10 Essential (primary) hypertension: Secondary | ICD-10-CM

## 2018-12-16 LAB — CBC
HCT: 28.4 % — ABNORMAL LOW (ref 36.0–46.0)
Hemoglobin: 9.7 g/dL — ABNORMAL LOW (ref 12.0–15.0)
MCH: 32.4 pg (ref 26.0–34.0)
MCHC: 34.2 g/dL (ref 30.0–36.0)
MCV: 95 fL (ref 80.0–100.0)
Platelets: 104 10*3/uL — ABNORMAL LOW (ref 150–400)
RBC: 2.99 MIL/uL — ABNORMAL LOW (ref 3.87–5.11)
RDW: 17.6 % — ABNORMAL HIGH (ref 11.5–15.5)
WBC: 6.7 10*3/uL (ref 4.0–10.5)
nRBC: 0 % (ref 0.0–0.2)

## 2018-12-16 LAB — COMPREHENSIVE METABOLIC PANEL
ALT: 35 U/L (ref 0–44)
AST: 59 U/L — ABNORMAL HIGH (ref 15–41)
Albumin: 2 g/dL — ABNORMAL LOW (ref 3.5–5.0)
Alkaline Phosphatase: 139 U/L — ABNORMAL HIGH (ref 38–126)
Anion gap: 9 (ref 5–15)
BUN: 10 mg/dL (ref 8–23)
CO2: 25 mmol/L (ref 22–32)
Calcium: 7.9 mg/dL — ABNORMAL LOW (ref 8.9–10.3)
Chloride: 98 mmol/L (ref 98–111)
Creatinine, Ser: 0.87 mg/dL (ref 0.44–1.00)
GFR calc Af Amer: >60 mL/min (ref >60)
GFR calc non Af Amer: >60 mL/min (ref >60)
Glucose, Bld: 101 mg/dL — ABNORMAL HIGH (ref 70–99)
Potassium: 2.8 mmol/L — ABNORMAL LOW (ref 3.5–5.1)
Sodium: 132 mmol/L — ABNORMAL LOW (ref 135–145)
Total Bilirubin: 2.7 mg/dL — ABNORMAL HIGH (ref 0.3–1.2)
Total Protein: 6.7 g/dL (ref 6.5–8.1)

## 2018-12-16 LAB — URINALYSIS, ROUTINE W REFLEX MICROSCOPIC
Bacteria, UA: NONE SEEN
Bilirubin Urine: NEGATIVE
Glucose, UA: NEGATIVE mg/dL
Ketones, ur: NEGATIVE mg/dL
Nitrite: NEGATIVE
Protein, ur: NEGATIVE mg/dL
RBC / HPF: >50 RBC/hpf — ABNORMAL HIGH (ref 0–5)
Specific Gravity, Urine: 1.017 (ref 1.005–1.030)
pH: 6 (ref 5.0–8.0)

## 2018-12-16 LAB — MAGNESIUM
Magnesium: 1.9 mg/dL (ref 1.7–2.4)
Magnesium: 1.8 mg/dL (ref 1.7–2.4)

## 2018-12-16 LAB — HEPATIC FUNCTION PANEL
ALT: 35 U/L (ref 0–44)
AST: 107 U/L — ABNORMAL HIGH (ref 15–41)
Albumin: 1.9 g/dL — ABNORMAL LOW (ref 3.5–5.0)
Alkaline Phosphatase: 134 U/L — ABNORMAL HIGH (ref 38–126)
Bilirubin, Direct: 1.1 mg/dL — ABNORMAL HIGH (ref 0.0–0.2)
Indirect Bilirubin: 2 mg/dL — ABNORMAL HIGH (ref 0.3–0.9)
Total Bilirubin: 3.1 mg/dL — ABNORMAL HIGH (ref 0.3–1.2)
Total Protein: 6.6 g/dL (ref 6.5–8.1)

## 2018-12-16 LAB — PROTIME-INR
INR: 1.39
Prothrombin Time: 16.9 seconds — ABNORMAL HIGH (ref 11.4–15.2)

## 2018-12-16 LAB — BRAIN NATRIURETIC PEPTIDE: B Natriuretic Peptide: 70.7 pg/mL (ref 0.0–100.0)

## 2018-12-16 LAB — APTT: aPTT: 35 seconds (ref 24–36)

## 2018-12-16 LAB — OCCULT BLOOD X 1 CARD TO LAB, STOOL: Fecal Occult Bld: POSITIVE — AB

## 2018-12-16 LAB — AMMONIA: Ammonia: 39 umol/L — ABNORMAL HIGH (ref 9–35)

## 2018-12-16 MED ORDER — ASPIRIN 81 MG PO CHEW
81.0000 mg | CHEWABLE_TABLET | Freq: Every day | ORAL | Status: DC
  Administered 2018-12-16 – 2018-12-22 (×7): 81 mg via ORAL
  Filled 2018-12-16 (×7): qty 1

## 2018-12-16 MED ORDER — LACTULOSE 10 GM/15ML PO SOLN
20.0000 g | Freq: Two times a day (BID) | ORAL | Status: DC | PRN
  Filled 2018-12-16: qty 30

## 2018-12-16 MED ORDER — FUROSEMIDE 20 MG PO TABS
80.0000 mg | ORAL_TABLET | Freq: Every day | ORAL | Status: DC
  Administered 2018-12-16 – 2018-12-17 (×2): 80 mg via ORAL
  Filled 2018-12-16 (×2): qty 4

## 2018-12-16 MED ORDER — SPIRONOLACTONE 25 MG PO TABS
25.0000 mg | ORAL_TABLET | Freq: Every day | ORAL | Status: DC
  Administered 2018-12-16 – 2018-12-17 (×2): 25 mg via ORAL
  Filled 2018-12-16 (×2): qty 1

## 2018-12-16 MED ORDER — FUROSEMIDE 40 MG PO TABS
40.0000 mg | ORAL_TABLET | Freq: Every day | ORAL | Status: DC
  Administered 2018-12-16 – 2018-12-17 (×2): 40 mg via ORAL
  Filled 2018-12-16 (×2): qty 1

## 2018-12-16 MED ORDER — ALBUMIN HUMAN 25 % IV SOLN
100.0000 g | Freq: Once | INTRAVENOUS | Status: AC
  Administered 2018-12-16: 12.5 g via INTRAVENOUS
  Filled 2018-12-16: qty 400

## 2018-12-16 MED ORDER — TRAMADOL HCL 50 MG PO TABS
50.0000 mg | ORAL_TABLET | Freq: Once | ORAL | Status: AC
  Administered 2018-12-16: 50 mg via ORAL
  Filled 2018-12-16: qty 1

## 2018-12-16 MED ORDER — FUROSEMIDE 10 MG/ML IJ SOLN
40.0000 mg | Freq: Once | INTRAMUSCULAR | Status: AC
  Administered 2018-12-16: 40 mg via INTRAVENOUS
  Filled 2018-12-16: qty 4

## 2018-12-16 MED ORDER — MAGNESIUM OXIDE 400 (241.3 MG) MG PO TABS
400.0000 mg | ORAL_TABLET | Freq: Every day | ORAL | Status: DC
  Administered 2018-12-16 – 2018-12-22 (×6): 400 mg via ORAL
  Filled 2018-12-16 (×7): qty 1

## 2018-12-16 MED ORDER — ONDANSETRON HCL 4 MG PO TABS: 4.0000 mg | ORAL_TABLET | Freq: Four times a day (QID) | ORAL | Status: DC | PRN

## 2018-12-16 MED ORDER — IPRATROPIUM-ALBUTEROL 0.5-2.5 (3) MG/3ML IN SOLN
3.0000 mL | Freq: Two times a day (BID) | RESPIRATORY_TRACT | Status: DC
  Administered 2018-12-16 – 2018-12-18 (×5): 3 mL via RESPIRATORY_TRACT
  Filled 2018-12-16 (×5): qty 3

## 2018-12-16 MED ORDER — AMLODIPINE BESYLATE 5 MG PO TABS
5.0000 mg | ORAL_TABLET | Freq: Every day | ORAL | Status: DC
  Administered 2018-12-16: 5 mg via ORAL
  Filled 2018-12-16: qty 1

## 2018-12-16 MED ORDER — ONDANSETRON HCL 4 MG/2ML IJ SOLN: 4.0000 mg | Freq: Four times a day (QID) | INTRAMUSCULAR | Status: DC | PRN

## 2018-12-16 MED ORDER — IOHEXOL 300 MG/ML  SOLN
100.0000 mL | Freq: Once | INTRAMUSCULAR | Status: AC | PRN
  Administered 2018-12-16: 100 mL via INTRAVENOUS

## 2018-12-16 MED ORDER — POTASSIUM CHLORIDE CRYS ER 20 MEQ PO TBCR
60.0000 meq | EXTENDED_RELEASE_TABLET | ORAL | Status: AC
  Administered 2018-12-16: 60 meq via ORAL
  Filled 2018-12-16: qty 3

## 2018-12-16 MED ORDER — SENNOSIDES-DOCUSATE SODIUM 8.6-50 MG PO TABS
1.0000 | ORAL_TABLET | Freq: Once | ORAL | Status: AC
  Administered 2018-12-16: 1 via ORAL
  Filled 2018-12-16: qty 1

## 2018-12-16 MED ORDER — POTASSIUM CHLORIDE CRYS ER 20 MEQ PO TBCR
40.0000 meq | EXTENDED_RELEASE_TABLET | Freq: Once | ORAL | Status: AC
  Administered 2018-12-16: 40 meq via ORAL
  Filled 2018-12-16: qty 2

## 2018-12-16 MED ORDER — POTASSIUM CHLORIDE 10 MEQ/100ML IV SOLN
10.0000 meq | Freq: Once | INTRAVENOUS | Status: AC
  Administered 2018-12-16: 10 meq via INTRAVENOUS
  Filled 2018-12-16: qty 100

## 2018-12-16 MED ORDER — MAGNESIUM OXIDE 400 (241.3 MG) MG PO TABS
800.0000 mg | ORAL_TABLET | Freq: Once | ORAL | Status: AC
  Administered 2018-12-16: 800 mg via ORAL
  Filled 2018-12-16: qty 2

## 2018-12-16 MED ORDER — MAGNESIUM SULFATE 2 GM/50ML IV SOLN
2.0000 g | Freq: Once | INTRAVENOUS | Status: AC
  Administered 2018-12-16: 2 g via INTRAVENOUS
  Filled 2018-12-16: qty 50

## 2018-12-16 NOTE — ED Provider Notes (Signed)
[facility] EMERGENCY DEPARTMENT Provider Note   CSN: [id number] Arrival date & time: 12/15/18  2120     History   Chief Complaint Chief Complaint  Patient presents with  . Leg Swelling    HPI Darlene Delacruz is a 73 y.o. female.  73 yo F with a chief complaint of swelling.  Patient has had diffuse body swelling since she was in an outside hospital about a month ago.  Patient got sick at the beginning of December and spent a prolonged course in the hospital and then was sent to rehab.  She was admitted for pneumonia with acute respiratory failure and ended up being on the ventilator for about 6 days.  Since she has been in rehab the patient feels that she has been putting on fluid and urinating less.  She has been having trouble getting around with the weight of her legs.  She feels that the fluid is built up into her abdomen as well.  Denies history of heart failure.  Per the family she had a reportedly normal echocardiogram while she was in the hospital.  She has some shortness of breath with exertion.  Denies orthopnea PND.  The history is provided by the patient.  Illness  Severity:  Moderate Onset quality:  Sudden Duration:  2 months Timing:  Constant Progression:  Worsening Chronicity:  New Associated symptoms: fatigue   Associated symptoms: no chest pain, no congestion, no fever, no headaches, no myalgias, no nausea, no rhinorrhea, no shortness of breath, no vomiting and no wheezing     Past Medical History:  Diagnosis Date  . Arthritis   . Hyperlipidemia   . Spastic esophagus     Patient Active Problem List   Diagnosis Date Noted  . Anasarca 12/16/2018  . Liver cirrhosis ([place]) 12/16/2018  . Alcohol abuse 12/16/2018  . Hypokalemia 12/16/2018  . Hypomagnesemia 12/16/2018  . Thrombocytopenia ([place]) 12/16/2018  . Normocytic anemia 12/16/2018  . Abnormal LFTs 12/16/2018  . Abdominal pain, left lower quadrant 03/06/2011  . SINUSITIS - ACUTE-NOS  11/28/2010  . OTHER ACUTE SINUSITIS 11/19/2010  . TRANSAMINASES, SERUM, ELEVATED 10/27/2010  . ESOPHAGEAL SPASM 09/29/2010  . Essential hypertension 09/25/2010  . SINUS BRADYCARDIA 09/25/2010  . DIZZINESS 09/25/2010  . ANXIETY, SITUATIONAL 06/26/2010  . HLD (hyperlipidemia) 12/13/2009  . OSTEOPENIA 12/13/2009  . ELEVATED BP READING WITHOUT DX HYPERTENSION 12/13/2009    Past Surgical History:  Procedure Laterality Date  . ABDOMINAL HYSTERECTOMY     menorrhagia  . BUNIONECTOMY     both feet  . CARPAL TUNNEL RELEASE  10/13/2011   Procedure: CARPAL TUNNEL RELEASE;  Surgeon: Wynonia Sours, MD;  Location: [place];  Service: Orthopedics;  Laterality: Right;  . EAR CYST EXCISION  10/13/2011   Procedure: CYST REMOVAL;  Surgeon: Wynonia Sours, MD;  Location: [place];  Service: Orthopedics;  Laterality: Right;  excision cyst possible rotator flap, debridement dip right middle finger  . left knee arthroscopy    . right rotator cuff repair       OB History   No obstetric history on file.      Home Medications    Prior to Admission medications   Medication Sig Start Date End Date Taking? Authorizing Provider  amLODipine (NORVASC) 5 MG tablet Take 5 mg by mouth daily.   Yes [provider]  aspirin 81 MG tablet Take 81 mg by mouth daily.    Yes [provider]  furosemide (  LASIX) 80 MG tablet Take 40-80 mg by mouth daily. Taking 80mg  in th AM and (1/2 tab=40mg ) in the afternoon.   Yes [provider]  Magnesium 400 MG CAPS Take 400 mg by mouth.   Yes [provider]  pravastatin (PRAVACHOL) 20 MG tablet Take 1 tablet (20 mg total) by mouth at bedtime. Patient not taking: Reported on 12/16/2018 08/20/11   Lucille Passy, MD    Family History Family History  Problem Relation Age of Onset  . Heart failure Mother     Social History Social History   Tobacco Use  . Smoking status: Former Smoker    Last attempt to  quit: 04/07/1991    Years since quitting: 27.7  . Smokeless tobacco: Never Used  Substance Use Topics  . Alcohol use: No  . Drug use: No     Allergies   Patient has no known allergies.   Review of Systems Review of Systems  Constitutional: Positive for fatigue. Negative for chills and fever.  HENT: Negative for congestion and rhinorrhea.   Eyes: Negative for redness and visual disturbance.  Respiratory: Negative for shortness of breath and wheezing.   Cardiovascular: Positive for leg swelling. Negative for chest pain and palpitations.  Gastrointestinal: Negative for nausea and vomiting.  Genitourinary: Negative for dysuria and urgency.  Musculoskeletal: Negative for arthralgias and myalgias.  Skin: Negative for pallor and wound.  Neurological: Negative for dizziness and headaches.     Physical Exam Updated Vital Signs BP (!) 133/57 (BP Location: Left Arm)   Pulse 89   Temp 98 F (36.7 C) (Oral)   Resp 16   Ht 5' 6.5" (1.689 m)   Wt 88.6 kg   SpO2 97%   BMI 31.06 kg/m   Physical Exam Vitals signs and nursing note reviewed.  Constitutional:      General: She is not in acute distress.    Appearance: She is well-developed. She is not diaphoretic.  HENT:     Head: Normocephalic and atraumatic.  Eyes:     Pupils: Pupils are equal, round, and reactive to light.  Neck:     Musculoskeletal: Normal range of motion and neck supple.     Comments: jvd just above the clavicles  Cardiovascular:     Rate and Rhythm: Normal rate and regular rhythm.     Heart sounds: No murmur. No friction rub. No gallop.   Pulmonary:     Effort: Pulmonary effort is normal.     Breath sounds: Rales (bases) present. No wheezing.  Abdominal:     General: There is distension ( Positive fluid wave).     Palpations: Abdomen is soft.     Tenderness: There is no abdominal tenderness.  Musculoskeletal:        General: No tenderness.     Right lower leg: Edema present.     Left lower leg: Edema  present.     Comments: 4+ edema up to the abdomen bilaterally  Skin:    General: Skin is warm and dry.  Neurological:     Mental Status: She is alert and oriented to person, place, and time.  Psychiatric:        Behavior: Behavior normal.      ED Treatments / Results  Labs (all labs ordered are listed, but only abnormal results are displayed) Labs Reviewed  BASIC METABOLIC PANEL - Abnormal; Notable for the following components:      Result Value   Sodium 132 (*)  Potassium 2.8 (*)    Glucose, Bld 111 (*)    Calcium 8.0 (*)    All other components within normal limits  CBC - Abnormal; Notable for the following components:   RBC 2.99 (*)    Hemoglobin 10.0 (*)    HCT 29.1 (*)    RDW 17.8 (*)    Platelets 116 (*)    All other components within normal limits  HEPATIC FUNCTION PANEL - Abnormal; Notable for the following components:   Albumin 1.9 (*)    AST 107 (*)    Alkaline Phosphatase 134 (*)    Total Bilirubin 3.1 (*)    Bilirubin, Direct 1.1 (*)    Indirect Bilirubin 2.0 (*)    All other components within normal limits  PROTIME-INR - Abnormal; Notable for the following components:   Prothrombin Time 16.9 (*)    All other components within normal limits  BRAIN NATRIURETIC PEPTIDE  MAGNESIUM  HEPATITIS PANEL, ACUTE  MAGNESIUM  AMMONIA  APTT  URINALYSIS, ROUTINE W REFLEX MICROSCOPIC  CBC  COMPREHENSIVE METABOLIC PANEL  I-STAT TROPONIN, ED    EKG EKG Interpretation  Date/Time:  Friday December 16 2018 01:33:37 EST Ventricular Rate:  90 PR Interval:    QRS Duration: 86 QT Interval:  377 QTC Calculation: 462 R Axis:   52 Text Interpretation:  Sinus rhythm No significant change since last tracing Confirmed by Deno Etienne [phone]) on 12/16/2018 2:14:13 AM   Radiology Dg Chest 2 View  Result Date: 12/15/2018 CLINICAL DATA:  Shortness of breath and fluid retention for 1 day. Recent hospitalization for sepsis and pneumonia. Former smoker. EXAM: CHEST - 2 VIEW  COMPARISON:  01/09/2018 FINDINGS: Mild cardiac enlargement. No vascular congestion. Mild diffuse interstitial prominence to the lungs, likely representing early edema. Interstitial pneumonitis also possible. No blunting of costophrenic angles. No pneumothorax. No focal consolidation. Calcification of the aorta. Degenerative changes in the spine. IMPRESSION: Mild cardiac enlargement. Mild diffuse interstitial pattern to the lungs may represent early edema or interstitial pneumonitis. Electronically Signed   By: Lucienne Capers M.D.   On: 12/15/2018 22:34   Ct Abdomen Pelvis W Contrast  Result Date: 12/16/2018 CLINICAL DATA:  73 y/o  F; weight gain and shortness of breath. EXAM: CT ABDOMEN AND PELVIS WITH CONTRAST TECHNIQUE: Multidetector CT imaging of the abdomen and pelvis was performed using the standard protocol following bolus administration of intravenous contrast. CONTRAST:  151mL OMNIPAQUE IOHEXOL 300 MG/ML  SOLN COMPARISON:  09/16/2017 abdominal ultrasound. FINDINGS: Lower chest: Mild interlobular septal thickening and ground-glass opacities in the lung bases compatible with interstitial pulmonary edema. Cardiomegaly. Hepatobiliary: Mild enlargement of the left lobe of the liver suggestion of a lobulated contour may reflect underlying cirrhosis. No focal liver lesion. Cholelithiasis. No intra or extrahepatic biliary ductal dilatation. Recanalized umbilical vein and prominent omental vessels, probable portal caval collaterals. Pancreas: Unremarkable. No pancreatic ductal dilatation or surrounding inflammatory changes. Spleen: Normal in size without focal abnormality. Adrenals/Urinary Tract: Adrenal glands are unremarkable. Kidneys are normal, without renal calculi, focal lesion, or hydronephrosis. Bladder is unremarkable. Stomach/Bowel: Stomach is within normal limits. Appendix appears normal. No evidence of bowel wall thickening, distention, or inflammatory changes. Vascular/Lymphatic: Aortic  atherosclerosis. No enlarged abdominal or pelvic lymph nodes. Reproductive: Status post hysterectomy. No adnexal masses. Other: Moderate volume of ascites. Diffuse subcutaneous edema. Musculoskeletal: No fracture is seen. L4-5 grade 1 anterolisthesis due to prominent facet arthropathy. IMPRESSION: 1. Findings compatible with cirrhosis including recanalized umbilical vein and omental collaterals. 2. Moderate volume of ascites.  Anasarca. 3. Mild interstitial pulmonary edema. Partially visualized cardiomegaly. 4.  Aortic Atherosclerosis (ICD10-I70.0). Electronically Signed   By: Kristine Garbe M.D.   On: 12/16/2018 02:52    Procedures Procedures (including critical care time)  Medications Ordered in ED Medications  magnesium sulfate IVPB 2 g 50 mL (has no administration in time range)  aspirin chewable tablet 81 mg (has no administration in time range)  amLODipine (NORVASC) tablet 5 mg (has no administration in time range)  furosemide (LASIX) tablet 40-80 mg (has no administration in time range)  magnesium oxide (MAG-OX) tablet 400 mg (has no administration in time range)  spironolactone (ALDACTONE) tablet 25 mg (has no administration in time range)  albumin human 25 % solution 100 g (has no administration in time range)  ondansetron (ZOFRAN) tablet 4 mg (has no administration in time range)    Or  ondansetron (ZOFRAN) injection 4 mg (has no administration in time range)  lactulose (CHRONULAC) 10 GM/15ML solution 20 g (has no administration in time range)  potassium chloride SA (K-DUR,KLOR-CON) CR tablet 40 mEq (40 mEq Oral Given 12/16/18 0135)  magnesium oxide (MAG-OX) tablet 800 mg (800 mg Oral Given 12/16/18 0135)  potassium chloride 10 mEq in 100 mL IVPB (0 mEq Intravenous Stopped 12/16/18 0247)  furosemide (LASIX) injection 40 mg (40 mg Intravenous Given 12/16/18 0139)  iohexol (OMNIPAQUE) 300 MG/ML solution 100 mL (100 mLs Intravenous Contrast Given 12/16/18 0239)  potassium chloride  SA (K-DUR,KLOR-CON) CR tablet 40 mEq (40 mEq Oral Given 12/16/18 0400)     Initial Impression / Assessment and Plan / ED Course  I have reviewed the triage vital signs and the nursing notes.  Pertinent labs & imaging results that were available during my care of the patient were reviewed by me and considered in my medical decision making (see chart for details).     73 yo F with a chief complaint of edema.  By the history this sounds like the patient got significant fluid resuscitation during her illness and is having some trouble removing the fluid.  She was seen at a hospital in [place].  Chest x-ray with some mild fluid overload, patient's potassium is significantly low at 2.8.  The patient also has a reported heavy alcohol history.  I wonder if the patient has new onset ascites, will obtain LFTs and an INR.  Obtain a BNP.  EKG for the patient's shortness of breath and hypokalemia.  Patient had some response to Lasix through the IV.  CT scan of the abdomen pelvis concerning for new diagnosis of cirrhosis with ascites.  With her having difficulty getting around at home due to the amount of peripheral edema will discuss with the hospitalist for possible admission.  The patients results and plan were reviewed and discussed.   Any x-rays performed were independently reviewed by myself.   Differential diagnosis were considered with the presenting HPI.  Medications  magnesium sulfate IVPB 2 g 50 mL (has no administration in time range)  aspirin chewable tablet 81 mg (has no administration in time range)  amLODipine (NORVASC) tablet 5 mg (has no administration in time range)  furosemide (LASIX) tablet 40-80 mg (has no administration in time range)  magnesium oxide (MAG-OX) tablet 400 mg (has no administration in time range)  spironolactone (ALDACTONE) tablet 25 mg (has no administration in time range)  albumin human 25 % solution 100 g (has no administration in time range)  ondansetron  (ZOFRAN) tablet 4 mg (has no administration in time range)  Or  ondansetron (ZOFRAN) injection 4 mg (has no administration in time range)  lactulose (CHRONULAC) 10 GM/15ML solution 20 g (has no administration in time range)  potassium chloride SA (K-DUR,KLOR-CON) CR tablet 40 mEq (40 mEq Oral Given 12/16/18 0135)  magnesium oxide (MAG-OX) tablet 800 mg (800 mg Oral Given 12/16/18 0135)  potassium chloride 10 mEq in 100 mL IVPB (0 mEq Intravenous Stopped 12/16/18 0247)  furosemide (LASIX) injection 40 mg (40 mg Intravenous Given 12/16/18 0139)  iohexol (OMNIPAQUE) 300 MG/ML solution 100 mL (100 mLs Intravenous Contrast Given 12/16/18 0239)  potassium chloride SA (K-DUR,KLOR-CON) CR tablet 40 mEq (40 mEq Oral Given 12/16/18 0400)    Vitals:   12/15/18 2127 12/15/18 2128 12/16/18 0313  BP:  (!) 144/62 (!) 133/57  Pulse:  96 89  Resp:  18 16  Temp:  97.8 F (36.6 C) 98 F (36.7 C)  TempSrc:  Oral Oral  SpO2:  98% 97%  Weight: 88.6 kg    Height: 5' 6.5" (1.689 m)      Final diagnoses:  Anasarca  Alcoholic cirrhosis of liver with ascites ([place])    Admission/ observation were discussed with the admitting physician, patient and/or family and they are comfortable with the plan.    Final Clinical Impressions(s) / ED Diagnoses   Final diagnoses:  Anasarca  Alcoholic cirrhosis of liver with ascites [facility])    ED Discharge Orders    None       Deno Etienne, DO 12/16/18 [id number]

## 2018-12-16 NOTE — Plan of Care (Signed)

## 2018-12-16 NOTE — H&P (Signed)
History and Physical    Darlene Delacruz XTK:[id number] DOB: [birth year] DOA: 12/15/2018  Referring MD/NP/PA:   PCP: Janie Morning, DO   Patient coming from:  The patient is coming from SNF  At baseline, pt is dependent for most of ADL.        Chief Complaint: Body swelling  HPI: Darlene Delacruz is a 73 y.o. female with medical history significant of hypertension, hyperlipidemia, thrombocytopenia, alcohol abuse, who presents with body sweating.  Patient states that she was hospitalized due to pneumonia 40 days ago, and discharged to SNF for rehab.  She noted worsening body swelling, and has gained more than 30 pounds of weight recently.  She has a severe bilateral leg edema, which has been gradually spreading up to her abdomen.  She has mild shortness breath on exertion.  Denies history of heart failure.  No chest pain.  She has mild dry cough, but no fever or chills.  Patient does not have nausea vomiting or diarrhea.  She does not have abdominal pain at rest.  She states that cough induces mild abdominal pain. No dark stool or melena. Denies symptoms of UTI.  No unilateral weakness.  No hematuria.  Patient has been heavily drinking for more than 20 years.  She stopped drinking 40 days ago when she was sick due to pneumonia.  Per her daughter, patient likely had a normal 2D echo ultrasound in recent admission though she is not 100% sure.  ED Course: pt was found to have WBC 8.0, BNP 70.7, INR 1.39, potassium 2.8, creatinine 0.83, BUN 11, abnormal liver function (ALP 134, albumin 1.9, AST 107, ALT 35, total bilirubin 3.1), temperature normal, heart rate 90s, no tachypnea, oxygen saturation 92% on room air.  Chest x-ray showed cardiomegaly, interstitial change.  CT abdomen/pelvis showed cirrhosis with ascites.  Review of Systems:   General: no fevers, chills, has body weight gain, has poor appetite, has fatigue HEENT: no blurry vision, hearing changes or sore throat Respiratory: has mild dyspnea,  coughing, no wheezing CV: no chest pain, no palpitations GI: no nausea, vomiting, abdominal pain, diarrhea, constipation. Has abdominal swelling GU: no dysuria, burning on urination, increased urinary frequency, hematuria  Ext: has severe leg edema Neuro: no unilateral weakness, numbness, or tingling, no vision change or hearing loss Skin: no rash, no skin tear. MSK: No muscle spasm, no deformity, no limitation of range of movement in spin Heme: No easy bruising.  Travel history: No recent long distant travel.  Allergy: No Known Allergies  Past Medical History:  Diagnosis Date  . Arthritis   . Hyperlipidemia   . Spastic esophagus     Past Surgical History:  Procedure Laterality Date  . ABDOMINAL HYSTERECTOMY     menorrhagia  . BUNIONECTOMY     both feet  . CARPAL TUNNEL RELEASE  10/13/2011   Procedure: CARPAL TUNNEL RELEASE;  Surgeon: Wynonia Sours, MD;  Location: [facility];  Service: Orthopedics;  Laterality: Right;  . EAR CYST EXCISION  10/13/2011   Procedure: CYST REMOVAL;  Surgeon: Wynonia Sours, MD;  Location: [facility];  Service: Orthopedics;  Laterality: Right;  excision cyst possible rotator flap, debridement dip right middle finger  . left knee arthroscopy    . right rotator cuff repair      Social History:  reports that she quit smoking about 27 years ago. She has never used smokeless tobacco. She reports that she does not drink alcohol or use drugs.  Family  History:  Family History  Problem Relation Age of Onset  . Heart failure Mother      Prior to Admission medications   Medication Sig Start Date End Date Taking? Authorizing Provider  amLODipine (NORVASC) 5 MG tablet Take 5 mg by mouth daily.   Yes [provider]  aspirin 81 MG tablet Take 81 mg by mouth daily.    Yes [provider]  furosemide (LASIX) 80 MG tablet Take 40-80 mg by mouth daily. Taking 80mg  in th AM and (1/2 tab=40mg ) in the afternoon.   Yes  [provider]  Magnesium 400 MG CAPS Take 400 mg by mouth.   Yes [provider]  pravastatin (PRAVACHOL) 20 MG tablet Take 1 tablet (20 mg total) by mouth at bedtime. Patient not taking: Reported on 12/16/2018 08/20/11   Lucille Passy, MD    Physical Exam: Vitals:   12/15/18 2127 12/15/18 2128 12/16/18 0313  BP:  (!) 144/62 (!) 133/57  Pulse:  96 89  Resp:  18 16  Temp:  97.8 F (36.6 C) 98 F (36.7 C)  TempSrc:  Oral Oral  SpO2:  98% 97%  Weight: 88.6 kg    Height: 5' 6.5" (1.689 m)     General: Not in acute distress.  Has anasarca HEENT:       Eyes: PERRL, EOMI, has scleral icterus.       ENT: No discharge from the ears and nose, no pharynx injection, no tonsillar enlargement.        Neck: No JVD, no bruit, no mass felt. Heme: No neck lymph node enlargement. Cardiac: S1/S2, RRR, No murmurs, No gallops or rubs. Respiratory: No rales, wheezing, rhonchi or rubs. GI: Soft, distended, nontender, no rebound pain, no organomegaly, BS present. GU: No hematuria Ext: 3+ pitting leg edema bilaterally. 2+DP/PT pulse bilaterally. Musculoskeletal: No joint deformities, No joint redness or warmth, no limitation of ROM in spin. Skin: No rashes.  Neuro: Alert, oriented X3, cranial nerves II-XII grossly intact, moves all extremities normally. Psych: Patient is not psychotic, no suicidal or hemocidal ideation.  Labs on Admission: I have personally reviewed following labs and imaging studies  CBC: Recent Labs  Lab 12/15/18 2140  WBC 8.0  HGB 10.0*  HCT 29.1*  MCV 97.3  PLT 480*   Basic Metabolic Panel: Recent Labs  Lab 12/15/18 2140 12/16/18 0107  NA 132*  --   K 2.8*  --   CL 98  --   CO2 24  --   GLUCOSE 111*  --   BUN 11  --   CREATININE 0.83  --   CALCIUM 8.0*  --   MG  --  1.9   GFR: Estimated Creatinine Clearance: 69.3 mL/min (by C-G formula based on SCr of 0.83 mg/dL). Liver Function Tests: Recent Labs  Lab 12/16/18 0107  AST 107*  ALT 35    ALKPHOS 134*  BILITOT 3.1*  PROT 6.6  ALBUMIN 1.9*   No results for input(s): LIPASE, AMYLASE in the last 168 hours. No results for input(s): AMMONIA in the last 168 hours. Coagulation Profile: Recent Labs  Lab 12/16/18 0107  INR 1.39   Cardiac Enzymes: No results for input(s): CKTOTAL, CKMB, CKMBINDEX, TROPONINI in the last 168 hours. BNP (last 3 results) No results for input(s): PROBNP in the last 8760 hours. HbA1C: No results for input(s): HGBA1C in the last 72 hours. CBG: No results for input(s): GLUCAP in the last 168 hours. Lipid Profile: No results for input(s): CHOL,  HDL, LDLCALC, TRIG, CHOLHDL, LDLDIRECT in the last 72 hours. Thyroid Function Tests: No results for input(s): TSH, T4TOTAL, FREET4, T3FREE, THYROIDAB in the last 72 hours. Anemia Panel: No results for input(s): VITAMINB12, FOLATE, FERRITIN, TIBC, IRON, RETICCTPCT in the last 72 hours. Urine analysis:    Component Value Date/Time   COLORURINE AMBER (A) 01/09/2018 1429   APPEARANCEUR CLOUDY (A) 01/09/2018 1429   LABSPEC 1.020 01/09/2018 1429   PHURINE 6.0 01/09/2018 1429   GLUCOSEU NEGATIVE 01/09/2018 1429   HGBUR NEGATIVE 01/09/2018 1429   BILIRUBINUR NEGATIVE 01/09/2018 1429   BILIRUBINUR 2 03/06/2011 1505   KETONESUR NEGATIVE 01/09/2018 1429   PROTEINUR 30 (A) 01/09/2018 1429   UROBILINOGEN neg 03/06/2011 1505   NITRITE NEGATIVE 01/09/2018 1429   LEUKOCYTESUR MODERATE (A) 01/09/2018 1429   Sepsis Labs: @LABRCNTIP (procalcitonin:4,lacticidven:4) )No results found for this or any previous visit (from the past 240 hour(s)).   Radiological Exams on Admission: Dg Chest 2 View  Result Date: 12/15/2018 CLINICAL DATA:  Shortness of breath and fluid retention for 1 day. Recent hospitalization for sepsis and pneumonia. Former smoker. EXAM: CHEST - 2 VIEW COMPARISON:  01/09/2018 FINDINGS: Mild cardiac enlargement. No vascular congestion. Mild diffuse interstitial prominence to the lungs, likely  representing early edema. Interstitial pneumonitis also possible. No blunting of costophrenic angles. No pneumothorax. No focal consolidation. Calcification of the aorta. Degenerative changes in the spine. IMPRESSION: Mild cardiac enlargement. Mild diffuse interstitial pattern to the lungs may represent early edema or interstitial pneumonitis. Electronically Signed   By: Lucienne Capers M.D.   On: 12/15/2018 22:34   Ct Abdomen Pelvis W Contrast  Result Date: 12/16/2018 CLINICAL DATA:  73 y/o  F; weight gain and shortness of breath. EXAM: CT ABDOMEN AND PELVIS WITH CONTRAST TECHNIQUE: Multidetector CT imaging of the abdomen and pelvis was performed using the standard protocol following bolus administration of intravenous contrast. CONTRAST:  113mL OMNIPAQUE IOHEXOL 300 MG/ML  SOLN COMPARISON:  09/16/2017 abdominal ultrasound. FINDINGS: Lower chest: Mild interlobular septal thickening and ground-glass opacities in the lung bases compatible with interstitial pulmonary edema. Cardiomegaly. Hepatobiliary: Mild enlargement of the left lobe of the liver suggestion of a lobulated contour may reflect underlying cirrhosis. No focal liver lesion. Cholelithiasis. No intra or extrahepatic biliary ductal dilatation. Recanalized umbilical vein and prominent omental vessels, probable portal caval collaterals. Pancreas: Unremarkable. No pancreatic ductal dilatation or surrounding inflammatory changes. Spleen: Normal in size without focal abnormality. Adrenals/Urinary Tract: Adrenal glands are unremarkable. Kidneys are normal, without renal calculi, focal lesion, or hydronephrosis. Bladder is unremarkable. Stomach/Bowel: Stomach is within normal limits. Appendix appears normal. No evidence of bowel wall thickening, distention, or inflammatory changes. Vascular/Lymphatic: Aortic atherosclerosis. No enlarged abdominal or pelvic lymph nodes. Reproductive: Status post hysterectomy. No adnexal masses. Other: Moderate volume of  ascites. Diffuse subcutaneous edema. Musculoskeletal: No fracture is seen. L4-5 grade 1 anterolisthesis due to prominent facet arthropathy. IMPRESSION: 1. Findings compatible with cirrhosis including recanalized umbilical vein and omental collaterals. 2. Moderate volume of ascites.  Anasarca. 3. Mild interstitial pulmonary edema. Partially visualized cardiomegaly. 4.  Aortic Atherosclerosis (ICD10-I70.0). Electronically Signed   By: Kristine Garbe M.D.   On: 12/16/2018 02:52     EKG: Independently reviewed.  Sinus rhythm, QTC 462, T wave flattening, nonspecific ST change.  Assessment/Plan Principal Problem:   Anasarca Active Problems:   HLD (hyperlipidemia)   Essential hypertension   Liver cirrhosis (HCC)   Alcohol abuse   Hypokalemia   Hypomagnesemia   Thrombocytopenia (HCC)   Normocytic anemia   Abnormal  LFTs   Anasarca: Most likely due to alcoholic liver cirrhosis.  Patient has hypoalbuminemia with albumin 1.9.  No history of heart failure.  BNP 70.7.  Renal function normal.  -will place on med surg bed for obs -40 mg of IV lasix was given in ED -continue home dose of lasix 80 mg in AM and 40 mg in PM. -start spironolactone 25 mg daily -Give 1 dose of albumin 100 g -check urine protein (by UA)  Liver cirrhosis and Abnormal LFTs: The ratio of AST 107/ALT 35 is typical for alcohol abuse.  Patient's mental status is normal.  INR 1.39. -Check ammonia level -PTT -US-RUQ -Hepatitis panel  Hypokalemia and hypomagnesemia: K=2.8, Mg=1.9 - Repleted K - Give 2g of magnesium sulfate  HLD (hyperlipidemia): pt is not taking her pravastatin -Will not restart due to abnormal liver function  HTN:  -Continue home medications: Amlodipine -On Lasix -IV hydralazine prn  Alcohol abuse: stopped drinking 40 days ago. -Patient is educated to never drink alcohol again  Normocytic anemia: hgb 13.2 on 01/09/18-->10.0. denies dark stool or melena. -Check FOBT  Thrombocytopenia  ([place]): Platelet 116.  Most likely due to liver cirrhosis, no bleeding tendency now. -Follow-up with CBC    DVT ppx: SCD Code Status: Partial code (I discussed with the patient in the presence of her daughter, and explained the meaning of CODE STATUS, patient wants to be partial code, OK for CPR, but no intubation). Family Communication:  Yes, patient's daughter and son-in-law  at bed side Disposition Plan:  Anticipate discharge back to previous SNF Consults called: None Admission status: medical floor/obs         Date of Service 12/16/2018    Ivor Costa [facility] Pager [phone]  If 7PM-7AM, please contact night-coverage [email or web address] Password [email or web address] 12/16/2018, 4:13 AM

## 2018-12-16 NOTE — ED Notes (Signed)
Nurse will draw labs. 

## 2018-12-16 NOTE — Progress Notes (Addendum)
CSW met with patient and family/friend in room. Patient reports she was at [facility], discharged from there on Wednesday 1/8 and then admitted here today 1/10.   Patient is very adamant to not return to any Skilled Nursing Facility and reports she has family support at home, and would like to be set up with home health upon discharge.   CSW will continue to follow for PT recs.   Ashley Hill, LCSW [phone]  

## 2018-12-16 NOTE — ED Notes (Signed)
Breakfast tray ordered 

## 2018-12-17 ENCOUNTER — Other Ambulatory Visit: Payer: Self-pay

## 2018-12-17 ENCOUNTER — Encounter (HOSPITAL_COMMUNITY): Payer: Self-pay | Admitting: Internal Medicine

## 2018-12-17 DIAGNOSIS — R7989 Other specified abnormal findings of blood chemistry: Secondary | ICD-10-CM | POA: Diagnosis not present

## 2018-12-17 DIAGNOSIS — K7031 Alcoholic cirrhosis of liver with ascites: Principal | ICD-10-CM

## 2018-12-17 DIAGNOSIS — J158 Pneumonia due to other specified bacteria: Secondary | ICD-10-CM | POA: Diagnosis not present

## 2018-12-17 DIAGNOSIS — Z79899 Other long term (current) drug therapy: Secondary | ICD-10-CM | POA: Diagnosis not present

## 2018-12-17 DIAGNOSIS — R601 Generalized edema: Secondary | ICD-10-CM

## 2018-12-17 DIAGNOSIS — D696 Thrombocytopenia, unspecified: Secondary | ICD-10-CM | POA: Diagnosis not present

## 2018-12-17 DIAGNOSIS — R945 Abnormal results of liver function studies: Secondary | ICD-10-CM | POA: Diagnosis not present

## 2018-12-17 DIAGNOSIS — I361 Nonrheumatic tricuspid (valve) insufficiency: Secondary | ICD-10-CM | POA: Diagnosis not present

## 2018-12-17 DIAGNOSIS — J209 Acute bronchitis, unspecified: Secondary | ICD-10-CM | POA: Diagnosis present

## 2018-12-17 DIAGNOSIS — Z6831 Body mass index (BMI) 31.0-31.9, adult: Secondary | ICD-10-CM | POA: Diagnosis not present

## 2018-12-17 DIAGNOSIS — F1010 Alcohol abuse, uncomplicated: Secondary | ICD-10-CM | POA: Diagnosis not present

## 2018-12-17 DIAGNOSIS — Z87891 Personal history of nicotine dependence: Secondary | ICD-10-CM | POA: Diagnosis not present

## 2018-12-17 DIAGNOSIS — E43 Unspecified severe protein-calorie malnutrition: Secondary | ICD-10-CM | POA: Diagnosis not present

## 2018-12-17 DIAGNOSIS — J069 Acute upper respiratory infection, unspecified: Secondary | ICD-10-CM | POA: Diagnosis present

## 2018-12-17 DIAGNOSIS — K7030 Alcoholic cirrhosis of liver without ascites: Secondary | ICD-10-CM | POA: Diagnosis not present

## 2018-12-17 DIAGNOSIS — I351 Nonrheumatic aortic (valve) insufficiency: Secondary | ICD-10-CM | POA: Diagnosis not present

## 2018-12-17 DIAGNOSIS — R710 Precipitous drop in hematocrit: Secondary | ICD-10-CM | POA: Diagnosis not present

## 2018-12-17 DIAGNOSIS — B9789 Other viral agents as the cause of diseases classified elsewhere: Secondary | ICD-10-CM | POA: Diagnosis present

## 2018-12-17 DIAGNOSIS — R195 Other fecal abnormalities: Secondary | ICD-10-CM

## 2018-12-17 DIAGNOSIS — D509 Iron deficiency anemia, unspecified: Secondary | ICD-10-CM | POA: Diagnosis not present

## 2018-12-17 DIAGNOSIS — K921 Melena: Secondary | ICD-10-CM | POA: Diagnosis not present

## 2018-12-17 DIAGNOSIS — N390 Urinary tract infection, site not specified: Secondary | ICD-10-CM | POA: Diagnosis not present

## 2018-12-17 DIAGNOSIS — Z8249 Family history of ischemic heart disease and other diseases of the circulatory system: Secondary | ICD-10-CM | POA: Diagnosis not present

## 2018-12-17 DIAGNOSIS — E785 Hyperlipidemia, unspecified: Secondary | ICD-10-CM | POA: Diagnosis not present

## 2018-12-17 DIAGNOSIS — Z8701 Personal history of pneumonia (recurrent): Secondary | ICD-10-CM | POA: Diagnosis not present

## 2018-12-17 DIAGNOSIS — Z90710 Acquired absence of both cervix and uterus: Secondary | ICD-10-CM | POA: Diagnosis not present

## 2018-12-17 DIAGNOSIS — F1020 Alcohol dependence, uncomplicated: Secondary | ICD-10-CM | POA: Diagnosis present

## 2018-12-17 DIAGNOSIS — E8770 Fluid overload, unspecified: Secondary | ICD-10-CM | POA: Diagnosis present

## 2018-12-17 DIAGNOSIS — R0902 Hypoxemia: Secondary | ICD-10-CM | POA: Diagnosis present

## 2018-12-17 DIAGNOSIS — B952 Enterococcus as the cause of diseases classified elsewhere: Secondary | ICD-10-CM | POA: Diagnosis present

## 2018-12-17 DIAGNOSIS — D62 Acute posthemorrhagic anemia: Secondary | ICD-10-CM | POA: Diagnosis not present

## 2018-12-17 DIAGNOSIS — E876 Hypokalemia: Secondary | ICD-10-CM | POA: Diagnosis not present

## 2018-12-17 DIAGNOSIS — Z7982 Long term (current) use of aspirin: Secondary | ICD-10-CM | POA: Diagnosis not present

## 2018-12-17 DIAGNOSIS — R740 Nonspecific elevation of levels of transaminase and lactic acid dehydrogenase [LDH]: Secondary | ICD-10-CM | POA: Diagnosis not present

## 2018-12-17 DIAGNOSIS — I10 Essential (primary) hypertension: Secondary | ICD-10-CM | POA: Diagnosis not present

## 2018-12-17 LAB — COMPREHENSIVE METABOLIC PANEL
ALT: 22 U/L (ref 0–44)
AST: 40 U/L (ref 15–41)
Albumin: 2.8 g/dL — ABNORMAL LOW (ref 3.5–5.0)
Alkaline Phosphatase: 100 U/L (ref 38–126)
Anion gap: 8 (ref 5–15)
BUN: 9 mg/dL (ref 8–23)
CO2: 26 mmol/L (ref 22–32)
Calcium: 8.3 mg/dL — ABNORMAL LOW (ref 8.9–10.3)
Chloride: 103 mmol/L (ref 98–111)
Creatinine, Ser: 0.76 mg/dL (ref 0.44–1.00)
GFR calc Af Amer: >60 mL/min (ref >60)
GFR calc non Af Amer: >60 mL/min (ref >60)
Glucose, Bld: 98 mg/dL (ref 70–99)
Potassium: 3.7 mmol/L (ref 3.5–5.1)
Sodium: 137 mmol/L (ref 135–145)
Total Bilirubin: 3 mg/dL — ABNORMAL HIGH (ref 0.3–1.2)
Total Protein: 6.3 g/dL — ABNORMAL LOW (ref 6.5–8.1)

## 2018-12-17 LAB — CBC WITH DIFFERENTIAL/PLATELET
Basophils Absolute: 0 10*3/uL (ref 0.0–0.1)
Basophils Relative: 0 %
Eosinophils Absolute: 0.1 10*3/uL (ref 0.0–0.5)
Eosinophils Relative: 3 %
HCT: 24.2 % — ABNORMAL LOW (ref 36.0–46.0)
Hemoglobin: 8.1 g/dL — ABNORMAL LOW (ref 12.0–15.0)
Lymphocytes Relative: 18 %
Lymphs Abs: 1.2 10*3/uL (ref 0.7–4.0)
MCH: 32.7 pg (ref 26.0–34.0)
MCHC: 33.5 g/dL (ref 30.0–36.0)
MCV: 97.6 fL (ref 80.0–100.0)
Monocytes Absolute: 0.8 10*3/uL (ref 0.1–1.0)
Monocytes Relative: 12 %
Neutro Abs: 4.7 10*3/uL (ref 1.7–7.7)
Neutrophils Relative %: 67 %
Platelets: 81 10*3/uL — ABNORMAL LOW (ref 150–400)
RBC: 2.48 MIL/uL — ABNORMAL LOW (ref 3.87–5.11)
RDW: 17.7 % — ABNORMAL HIGH (ref 11.5–15.5)
WBC: 7 10*3/uL (ref 4.0–10.5)
nRBC: 0 % (ref 0.0–0.2)

## 2018-12-17 LAB — CBC
HCT: 26.4 % — ABNORMAL LOW (ref 36.0–46.0)
Hemoglobin: 9.1 g/dL — ABNORMAL LOW (ref 12.0–15.0)
MCH: 33.2 pg (ref 26.0–34.0)
MCHC: 34.5 g/dL (ref 30.0–36.0)
MCV: 96.4 fL (ref 80.0–100.0)
Platelets: 82 10*3/uL — ABNORMAL LOW (ref 150–400)
RBC: 2.74 MIL/uL — ABNORMAL LOW (ref 3.87–5.11)
RDW: 18.3 % — ABNORMAL HIGH (ref 11.5–15.5)
WBC: 9.6 10*3/uL (ref 4.0–10.5)
nRBC: 0 % (ref 0.0–0.2)

## 2018-12-17 LAB — RESPIRATORY PANEL BY PCR

## 2018-12-17 LAB — HEPATITIS PANEL, ACUTE
HCV Ab: <0.1 s/co ratio (ref 0.0–0.9)
Hep A IgM: NEGATIVE
Hep B C IgM: NEGATIVE
Hepatitis B Surface Ag: NEGATIVE

## 2018-12-17 LAB — PREPARE RBC (CROSSMATCH)

## 2018-12-17 LAB — TROPONIN I: Troponin I: 0.2 ng/mL (ref <0.03)

## 2018-12-17 LAB — ABO/RH: ABO/RH(D): A NEG

## 2018-12-17 MED ORDER — SODIUM CHLORIDE 0.9% IV SOLUTION
Freq: Once | INTRAVENOUS | Status: AC
  Administered 2018-12-17: 10:00:00 via INTRAVENOUS

## 2018-12-17 MED ORDER — SPIRONOLACTONE 25 MG PO TABS
100.0000 mg | ORAL_TABLET | Freq: Every day | ORAL | Status: DC
  Administered 2018-12-18 – 2018-12-22 (×5): 100 mg via ORAL
  Filled 2018-12-17 (×5): qty 4

## 2018-12-17 MED ORDER — FUROSEMIDE 10 MG/ML IJ SOLN
40.0000 mg | Freq: Every day | INTRAMUSCULAR | Status: DC
  Administered 2018-12-18 – 2018-12-19 (×2): 40 mg via INTRAVENOUS
  Filled 2018-12-17 (×2): qty 4

## 2018-12-17 MED ORDER — PANTOPRAZOLE SODIUM 40 MG IV SOLR
40.0000 mg | Freq: Two times a day (BID) | INTRAVENOUS | Status: DC
  Administered 2018-12-17 – 2018-12-22 (×11): 40 mg via INTRAVENOUS
  Filled 2018-12-17 (×11): qty 40

## 2018-12-17 MED ORDER — FUROSEMIDE 10 MG/ML IJ SOLN
20.0000 mg | INTRAMUSCULAR | Status: AC
  Administered 2018-12-17: 20 mg via INTRAVENOUS
  Filled 2018-12-17: qty 2

## 2018-12-17 MED ORDER — POTASSIUM CHLORIDE CRYS ER 20 MEQ PO TBCR
40.0000 meq | EXTENDED_RELEASE_TABLET | Freq: Every day | ORAL | Status: DC
  Administered 2018-12-17 – 2018-12-22 (×6): 40 meq via ORAL
  Filled 2018-12-17 (×6): qty 2

## 2018-12-17 MED ORDER — SODIUM CHLORIDE 0.9 % IV SOLN
1.0000 g | INTRAVENOUS | Status: DC
  Administered 2018-12-17 – 2018-12-19 (×3): 1 g via INTRAVENOUS
  Filled 2018-12-17 (×3): qty 10

## 2018-12-17 MED ORDER — IPRATROPIUM-ALBUTEROL 0.5-2.5 (3) MG/3ML IN SOLN
3.0000 mL | RESPIRATORY_TRACT | Status: DC | PRN
  Administered 2018-12-19 (×2): 3 mL via RESPIRATORY_TRACT
  Filled 2018-12-17 (×2): qty 3

## 2018-12-17 NOTE — Progress Notes (Signed)
In to assess pt, start ns for blood transfusion. Noted pt shivering, stating that she is "freezing". Vitals taken. Temp noted to be 99.6 orally. Consent obtained for blood transfusion. Pt verbalized understanding to teaching. Continue to monitor.

## 2018-12-17 NOTE — Progress Notes (Addendum)
Patient was admitted after midnight agree with the overall plan and assessment.  She reports that she had just recently gotten out of rehab and had not been getting her medications as prescribed.  Amlodipine is not one of her previous home medications.  She has not drank any alcohol in over 40 days. On physical exam patient has at least 2-3+ pitting edema and mild expiratory wheeze appreciated.  Hemoglobin 9.7 which appears appears lower than previous baseline which was noted to be within normal limits.  Check stool guaiacs.  Gave patient 60 mEq of potassium chloride as repeat potassium 2.8.  Added DuoNeb's as needed.  Refer to H&P for full details.

## 2018-12-17 NOTE — Consult Note (Signed)
Consultation  Referring Provider:     [place] Primary Care Physician:  Janie Morning, DO Primary Gastroenterologist:        Dr. Collene Mares Reason for Consultation:     Heme + anemia     Impression / Plan:    Anasarca most likely due to alcoholic cirrhosis, volume overload Heme + anemia Harsh systolic heart murmur   She needs: 1) paracentesis - ordered 2) IV furosemide, higher dose spironolactone (ordered) 3) Echocardiogram - ordered 4) No need for EGD or empiric tx for varices now - appropriate to be on CTX given GI bleeding and cirrhosis, but hold off on any EGD due to resp status and lack of signs of major hemorrhage.  Will see again tomorrow         HPI:   Darlene Delacruz is a 73 y.o. female admitted after inability to urinate - has anasarca. She has had alcoholic liver disease and thrombocytopenia for some time - was visiting her sister in [place] at [place] and became ill with "pneumonia and sepsis". Was admitted to the hospital in [place], [place]. There 2 weeks - then to [facility]. Home 1 day and brought here after unable to urinate. CT showed ascites and findings c/w cirrhosis. She is still dyspneic and says "my diagnosis is bronchitis". Has had LE edema for some time now. Last drink at Thanksgiving. 2-03 glasses wine/day x many years. Hx hepatic steatosis in past - had seen heme for tpenia 2018.  Has had some dark stools - "not black" and heme + here with decline in Hgb from the baseline we have.   Past Medical History:  Diagnosis Date  . Alcoholic cirrhosis ([place])   . Alcoholism ([place])   . Arthritis   . Hyperlipidemia   . Spastic esophagus     Past Surgical History:  Procedure Laterality Date  . ABDOMINAL HYSTERECTOMY     menorrhagia  . BUNIONECTOMY     both feet  . CARPAL TUNNEL RELEASE  10/13/2011   Procedure: CARPAL TUNNEL RELEASE;  Surgeon: Wynonia Sours, MD;  Location: [facility];  Service: Orthopedics;  Laterality: Right;  . COLONOSCOPY   2018   Dr. Collene Mares   . EAR CYST EXCISION  10/13/2011   Procedure: CYST REMOVAL;  Surgeon: Wynonia Sours, MD;  Location: [facility];  Service: Orthopedics;  Laterality: Right;  excision cyst possible rotator flap, debridement dip right middle finger  . left knee arthroscopy    . right rotator cuff repair      Family History  Problem Relation Age of Onset  . Heart failure Mother     Social History   Tobacco Use  . Smoking status: Former Smoker    Last attempt to quit: 04/07/1991    Years since quitting: 27.7  . Smokeless tobacco: Never Used  Substance Use Topics  . Alcohol use: No  . Drug use: No    Prior to Admission medications   Medication Sig Start Date End Date Taking? Authorizing Provider  amLODipine (NORVASC) 5 MG tablet Take 5 mg by mouth daily.   Yes [provider]  aspirin 81 MG tablet Take 81 mg by mouth daily.    Yes [provider]  furosemide (LASIX) 80 MG tablet Take 40-80 mg by mouth daily. Taking 80mg  in th AM and (1/2 tab=40mg ) in the afternoon.   Yes [provider]  Magnesium 400 MG CAPS Take 400 mg by mouth.   Yes  [provider]  pravastatin (PRAVACHOL) 20 MG tablet Take 1 tablet (20 mg total) by mouth at bedtime. Patient not taking: Reported on 12/16/2018 08/20/11   Lucille Passy, MD    Current Facility-Administered Medications  Medication Dose Route Frequency Provider Last Rate Last Dose  . aspirin chewable tablet 81 mg  81 mg Oral Daily Ivor Costa, MD   81 mg at 12/17/18 0949  . cefTRIAXone (ROCEPHIN) 1 g in sodium chloride 0.9 % 100 mL IVPB  1 g Intravenous Q24H Smith, Rondell A, MD 200 mL/hr at 12/17/18 1321 1 g at 12/17/18 1321  . furosemide (LASIX) tablet 40 mg  40 mg Oral q1800 Fuller Plan A, MD   40 mg at 12/17/18 1655  . furosemide (LASIX) tablet 80 mg  80 mg Oral Daily Ivor Costa, MD   80 mg at 12/17/18 0949  . ipratropium-albuterol (DUONEB) 0.5-2.5 (3) MG/3ML nebulizer solution 3 mL  3 mL  Nebulization BID Fuller Plan A, MD   3 mL at 12/17/18 0743  . ipratropium-albuterol (DUONEB) 0.5-2.5 (3) MG/3ML nebulizer solution 3 mL  3 mL Nebulization Q4H PRN Smith, Rondell A, MD      . magnesium oxide (MAG-OX) tablet 400 mg  400 mg Oral Daily Ivor Costa, MD   400 mg at 12/17/18 0948  . ondansetron (ZOFRAN) tablet 4 mg  4 mg Oral Q6H PRN Ivor Costa, MD       Or  . ondansetron [facility]) injection 4 mg  4 mg Intravenous Q6H PRN Ivor Costa, MD      . pantoprazole (PROTONIX) injection 40 mg  40 mg Intravenous Q12H Smith, Rondell A, MD   40 mg at 12/17/18 0948  . potassium chloride SA (K-DUR,KLOR-CON) CR tablet 40 mEq  40 mEq Oral Daily Fuller Plan A, MD   40 mEq at 12/17/18 0948  . spironolactone (ALDACTONE) tablet 25 mg  25 mg Oral Daily Ivor Costa, MD   25 mg at 12/17/18 0949    Allergies as of 12/15/2018  . (No Known Allergies)     Review of Systems:    This is positive for those things mentioned in the HPI, also positive for frail, weak All other review of systems are negative.       Physical Exam:  Vital signs in last 24 hours: Temp:  [97.9 F (36.6 C)-99.8 F (37.7 C)] 99.2 F (37.3 C) (01/11 1314) Pulse Rate:  [94-100] 98 (01/11 1314) Resp:  [18-20] 18 (01/11 1314) BP: (113-153)/(52-57) 122/57 (01/11 1314) SpO2:  [90 %-97 %] 97 % (01/11 1314) Last BM Date: 12/16/18  General:  Pale and acutrely and chronically ill, moderately dyspneic in chair Eyes:  anicteric. ENT:   Mouth and posterior pharynx free of lesions - dry mucosa.  Neck:   supple w/o thyromegaly or mass.  Lungs: Diffuse rhonchi/rales Heart:   S1S2 harsh 3-4/6 SEM. Abdomen:  Distended, some abd wall edema + ascites suspected.  Lymph:  no cervical or supraclavicular adenopathy. Extremities: 3+ bilateral LE edema, pitting Skin   Spider angiomata Neuro:  A&O x 3.  Psych:  appropriate mood and  Affect.   Data Reviewed:   LAB RESULTS: Recent Labs    12/16/18 0429 12/17/18 0638 12/17/18 1514    WBC 6.7 7.0 9.6  HGB 9.7* 8.1* 9.1*  HCT 28.4* 24.2* 26.4*  PLT 104* 81* 82*   BMET Recent Labs    12/15/18 2140 12/16/18 0429 12/17/18 0638  NA 132* 132* 137  K 2.8* 2.8* 3.7  CL 98 98 103  CO2 24 25 26   GLUCOSE 111* 101* 98  BUN 11 10 9   CREATININE 0.83 0.87 0.76  CALCIUM 8.0* 7.9* 8.3*   LFT Recent Labs    12/16/18 0107  12/17/18 0638  PROT 6.6   < > 6.3*  ALBUMIN 1.9*   < > 2.8*  AST 107*   < > 40  ALT 35   < > 22  ALKPHOS 134*   < > 100  BILITOT 3.1*   < > 3.0*  BILIDIR 1.1*  --   --   IBILI 2.0*  --   --    < > = values in this interval not displayed.   PT/INR Recent Labs    12/16/18 0107  LABPROT 16.9*  INR 1.39   Acute hepatitis panel neg  STUDIES: Dg Chest 2 View  Result Date: 12/15/2018 CLINICAL DATA:  Shortness of breath and fluid retention for 1 day. Recent hospitalization for sepsis and pneumonia. Former smoker. EXAM: CHEST - 2 VIEW COMPARISON:  01/09/2018 FINDINGS: Mild cardiac enlargement. No vascular congestion. Mild diffuse interstitial prominence to the lungs, likely representing early edema. Interstitial pneumonitis also possible. No blunting of costophrenic angles. No pneumothorax. No focal consolidation. Calcification of the aorta. Degenerative changes in the spine. IMPRESSION: Mild cardiac enlargement. Mild diffuse interstitial pattern to the lungs may represent early edema or interstitial pneumonitis. Electronically Signed   By: Lucienne Capers M.D.   On: 12/15/2018 22:34   Ct Abdomen Pelvis W Contrast  Result Date: 12/16/2018 CLINICAL DATA:  73 y/o  F; weight gain and shortness of breath. EXAM: CT ABDOMEN AND PELVIS WITH CONTRAST TECHNIQUE: Multidetector CT imaging of the abdomen and pelvis was performed using the standard protocol following bolus administration of intravenous contrast. CONTRAST:  122mL OMNIPAQUE IOHEXOL 300 MG/ML  SOLN COMPARISON:  09/16/2017 abdominal ultrasound. FINDINGS: Lower chest: Mild interlobular septal thickening  and ground-glass opacities in the lung bases compatible with interstitial pulmonary edema. Cardiomegaly. Hepatobiliary: Mild enlargement of the left lobe of the liver suggestion of a lobulated contour may reflect underlying cirrhosis. No focal liver lesion. Cholelithiasis. No intra or extrahepatic biliary ductal dilatation. Recanalized umbilical vein and prominent omental vessels, probable portal caval collaterals. Pancreas: Unremarkable. No pancreatic ductal dilatation or surrounding inflammatory changes. Spleen: Normal in size without focal abnormality. Adrenals/Urinary Tract: Adrenal glands are unremarkable. Kidneys are normal, without renal calculi, focal lesion, or hydronephrosis. Bladder is unremarkable. Stomach/Bowel: Stomach is within normal limits. Appendix appears normal. No evidence of bowel wall thickening, distention, or inflammatory changes. Vascular/Lymphatic: Aortic atherosclerosis. No enlarged abdominal or pelvic lymph nodes. Reproductive: Status post hysterectomy. No adnexal masses. Other: Moderate volume of ascites. Diffuse subcutaneous edema. Musculoskeletal: No fracture is seen. L4-5 grade 1 anterolisthesis due to prominent facet arthropathy. IMPRESSION: 1. Findings compatible with cirrhosis including recanalized umbilical vein and omental collaterals. 2. Moderate volume of ascites.  Anasarca. 3. Mild interstitial pulmonary edema. Partially visualized cardiomegaly. 4.  Aortic Atherosclerosis (ICD10-I70.0). Electronically Signed   By: Kristine Garbe M.D.   On: 12/16/2018 02:52   US Abdomen Limited Ruq  Result Date: 12/16/2018 CLINICAL DATA:  Attic cirrhosis EXAM: ULTRASOUND ABDOMEN LIMITED RIGHT UPPER QUADRANT COMPARISON:  CT from earlier today FINDINGS: Gallbladder: Layering sludge. The gallbladder wall is thickened to 4 mm, likely reactive to the patient's cirrhosis. A small shadowing stone is seen towards the fundus. Common bile duct: Diameter: 5 mm.  Where visualized, no filling  defect. Liver: Cirrhotic liver with patent and antegrade flow in the  main portal vein. Moderate ascites with simple appearance. No focal liver finding. IMPRESSION: 1. Gallbladder sludge and small stone. There is gallbladder wall thickening that is likely reactive to the chronic liver disease as there is no sonographic Murphy sign. 2. Cirrhosis with moderate ascites. There is antegrade flow in the main portal vein. Electronically Signed   By: Monte Fantasia M.D.   On: 12/16/2018 06:46      Thanks   LOS: 0 days   @Antwonette Feliz  Simonne Maffucci, MD, [facility] @  12/17/2018, 6:13 PM

## 2018-12-17 NOTE — Progress Notes (Signed)
PT Cancellation Note  Patient Details Name: LORMA HEATER MRN: [id number] DOB: Nov 15, 1946   Cancelled Treatment:    Reason Eval/Treat Not Completed: Medical issues which prohibited therapy, awaiting stat transfusion. Will follow-up for PT treatment as appropriate.  Mabeline Caras, PT, DPT Acute Rehabilitation Services  Pager [phone] Office [place] 12/17/2018, 10:45 AM

## 2018-12-17 NOTE — Progress Notes (Addendum)
Progress Note    Darlene Delacruz  YDX:[id number] DOB: Aug 31, 1946  DOA: 12/15/2018 PCP: Janie Morning, DO    Brief Narrative:   Chief complaint: Swelling  Medical records reviewed and are as summarized below:  Darlene Delacruz is an 73 y.o. female pmh of HTN, HLD, thrombocytopenia, and alcohol abuse with liver cirrhosis; who presents with complaints of whole body swelling and inability to urinate.  Noted recent admission for pneumonia for which she went to rehab and they changed her medicines.  Assessment/Plan:   Principal Problem:   Anasarca Active Problems:   HLD (hyperlipidemia)   Essential hypertension   Liver cirrhosis (HCC)   Alcohol abuse   Hypokalemia   Hypomagnesemia   Thrombocytopenia (HCC)   Normocytic anemia   Abnormal LFTs  Alcoholic liver cirrhosis with anasarca: Patient initially presented with whole body swelling and edema.  No previous history of heart failure.  Labs revealed albumin of 1.9, sodium 132, BUN 11, and total bilirubin 3.1 patient was initially given 40 mg of Lasix IV in the ED.  Then started on 80 mg Lasix p.o. every morning, 40 mg p.o. q. at bedtime, and spironolactone 25 mg was added. - Strict intake and output - Daily weight  Gastrointestinal bleed, acute blood loss anemia: Patient noted to have hemoglobin previously within normal limits, but but since admission has had a 2 g drop 10->9.7->8.1.  Stool guaiacs were obtained yesterday noted to be positive after dark stool overnight.  Blood pressures remained stable. - Protonix 40 mg IV twice daily - Type and screen for possible need of blood products with order to transfuse 1 unit of packed red blood cells - Recheck H&H posttransfusion - Give furosemide 20 mg IV  - Gastroenterology consulted, will follow-up for further recommendations  Acute bronchitis: Patient reports continued to cough and shortness of breath.  Previous x-ray showing signs of pneumonitis versus edema. - Duo nebs as needed for  shortness of breath/wheeze - Check respiratory virus panel - DuoNebs - Will reassess to determine if  steroids warranted   Elevated troponin: Patient had complained of chest pain overnight.  Check of troponin noted to be 0.2.  Patient with no chest complaints at this time -Trend troponins overnight  Abnormal UA/urinary retention: Patient reported inability to urinate.  Foley catheter day 2.  Initial UA positive for trace leukocytes, and many WBCs. - Follow-up urine culture - Rocephin IV empirically for possible UTI/upper respiratory infect/GI bleed   Protein calorie malnutrition - Check prealbumin in a.m. - Dietitian consult   History of alcohol abuse: Patient reports no recent use of alcohol and last drink over 40 days ago.  Body mass index is 31.06 kg/m.   Family Communication/Anticipated D/C date and plan/Code Status   DVT prophylaxis: SCDs Code Status: Partial code.  Family Communication: No family present at bedside Disposition Plan: To be determined   Medical Consultants:    [facility]   Anti-Infectives:    None  Subjective:   Patient reports that she is had a cough and has been short of breath overnight.  Also reports having a dark black stool.  Objective:    Vitals:   12/16/18 1500 12/16/18 2012 12/17/18 0345 12/17/18 0746  BP: (!) 101/57 (!) 113/56 (!) 117/56   Pulse: 89 96 94   Resp: 18 18 20    Temp: 98.5 F (36.9 C) 98.4 F (36.9 C) 97.9 F (36.6 C)   TempSrc: Oral Oral Oral   SpO2: 99% 91% 93% 91%  Weight:      Height:        Intake/Output Summary (Last 24 hours) at 12/17/2018 0903 Last data filed at 12/16/2018 2235 Gross per 24 hour  Intake 720 ml  Output 1000 ml  Net -280 ml   Filed Weights   12/15/18 2127  Weight: 88.6 kg    Exam: Constitutional: NAD, calm, comfortable Eyes: PERRL, lids and conjunctivae normal ENMT: Mucous membranes are moist. Posterior pharynx clear of any exudate or lesions.Normal dentition.    Neck: normal, supple, no masses, no thyromegaly Respiratory: Mildly tachypneic with expiratory wheeze appreciated and some crackles.  Maintaining O2 saturations on room air. Cardiovascular: Regular rate and rhythm, no murmurs / rubs / gallops.  2+ pitting lower extremity edema. 2+ pedal pulses. No carotid bruits.  Abdomen: no tenderness, no masses palpated. No hepatosplenomegaly. Bowel sounds positive.  Musculoskeletal: no clubbing / cyanosis. No joint deformity upper and lower extremities. Good ROM, no contractures. Normal muscle tone.  Skin: no rashes, lesions, ulcers. No induration Neurologic: CN 2-12 grossly intact. Sensation intact, DTR normal. Strength 5/5 in all 4.  Psychiatric: Normal judgment and insight. Alert and oriented x 3. Normal mood.    Data Reviewed:   I have personally reviewed following labs and imaging studies:  Labs: Labs show the following:   Basic Metabolic Panel: Recent Labs  Lab 12/15/18 2140 12/16/18 0107 12/16/18 0429 12/17/18 0638  NA 132*  --  132* 137  K 2.8*  --  2.8* 3.7  CL 98  --  98 103  CO2 24  --  25 26  GLUCOSE 111*  --  101* 98  BUN 11  --  10 9  CREATININE 0.83  --  0.87 0.76  CALCIUM 8.0*  --  7.9* 8.3*  MG  --  1.9 1.8  --    GFR Estimated Creatinine Clearance: 71.9 mL/min (by C-G formula based on SCr of 0.76 mg/dL). Liver Function Tests: Recent Labs  Lab 12/16/18 0107 12/16/18 0429 12/17/18 0638  AST 107* 59* 40  ALT 35 35 22  ALKPHOS 134* 139* 100  BILITOT 3.1* 2.7* 3.0*  PROT 6.6 6.7 6.3*  ALBUMIN 1.9* 2.0* 2.8*   No results for input(s): LIPASE, AMYLASE in the last 168 hours. Recent Labs  Lab 12/16/18 0429  AMMONIA 39*   Coagulation profile Recent Labs  Lab 12/16/18 0107  INR 1.39    CBC: Recent Labs  Lab 12/15/18 2140 12/16/18 0429 12/17/18 0638  WBC 8.0 6.7 7.0  NEUTROABS  --   --  4.7  HGB 10.0* 9.7* 8.1*  HCT 29.1* 28.4* 24.2*  MCV 97.3 95.0 97.6  PLT 116* 104* 81*   Cardiac Enzymes: No  results for input(s): CKTOTAL, CKMB, CKMBINDEX, TROPONINI in the last 168 hours. BNP (last 3 results) No results for input(s): PROBNP in the last 8760 hours. CBG: No results for input(s): GLUCAP in the last 168 hours. D-Dimer: No results for input(s): DDIMER in the last 72 hours. Hgb A1c: No results for input(s): HGBA1C in the last 72 hours. Lipid Profile: No results for input(s): CHOL, HDL, LDLCALC, TRIG, CHOLHDL, LDLDIRECT in the last 72 hours. Thyroid function studies: No results for input(s): TSH, T4TOTAL, T3FREE, THYROIDAB in the last 72 hours.  Invalid input(s): FREET3 Anemia work up: No results for input(s): VITAMINB12, FOLATE, FERRITIN, TIBC, IRON, RETICCTPCT in the last 72 hours. Sepsis Labs: Recent Labs  Lab 12/15/18 2140 12/16/18 0429 12/17/18 0638  WBC 8.0 6.7 7.0    Microbiology No  results found for this or any previous visit (from the past 240 hour(s)).  Procedures and diagnostic studies:  Dg Chest 2 View  Result Date: 12/15/2018 CLINICAL DATA:  Shortness of breath and fluid retention for 1 day. Recent hospitalization for sepsis and pneumonia. Former smoker. EXAM: CHEST - 2 VIEW COMPARISON:  01/09/2018 FINDINGS: Mild cardiac enlargement. No vascular congestion. Mild diffuse interstitial prominence to the lungs, likely representing early edema. Interstitial pneumonitis also possible. No blunting of costophrenic angles. No pneumothorax. No focal consolidation. Calcification of the aorta. Degenerative changes in the spine. IMPRESSION: Mild cardiac enlargement. Mild diffuse interstitial pattern to the lungs may represent early edema or interstitial pneumonitis. Electronically Signed   By: Lucienne Capers M.D.   On: 12/15/2018 22:34   Ct Abdomen Pelvis W Contrast  Result Date: 12/16/2018 CLINICAL DATA:  73 y/o  F; weight gain and shortness of breath. EXAM: CT ABDOMEN AND PELVIS WITH CONTRAST TECHNIQUE: Multidetector CT imaging of the abdomen and pelvis was performed  using the standard protocol following bolus administration of intravenous contrast. CONTRAST:  167mL OMNIPAQUE IOHEXOL 300 MG/ML  SOLN COMPARISON:  09/16/2017 abdominal ultrasound. FINDINGS: Lower chest: Mild interlobular septal thickening and ground-glass opacities in the lung bases compatible with interstitial pulmonary edema. Cardiomegaly. Hepatobiliary: Mild enlargement of the left lobe of the liver suggestion of a lobulated contour may reflect underlying cirrhosis. No focal liver lesion. Cholelithiasis. No intra or extrahepatic biliary ductal dilatation. Recanalized umbilical vein and prominent omental vessels, probable portal caval collaterals. Pancreas: Unremarkable. No pancreatic ductal dilatation or surrounding inflammatory changes. Spleen: Normal in size without focal abnormality. Adrenals/Urinary Tract: Adrenal glands are unremarkable. Kidneys are normal, without renal calculi, focal lesion, or hydronephrosis. Bladder is unremarkable. Stomach/Bowel: Stomach is within normal limits. Appendix appears normal. No evidence of bowel wall thickening, distention, or inflammatory changes. Vascular/Lymphatic: Aortic atherosclerosis. No enlarged abdominal or pelvic lymph nodes. Reproductive: Status post hysterectomy. No adnexal masses. Other: Moderate volume of ascites. Diffuse subcutaneous edema. Musculoskeletal: No fracture is seen. L4-5 grade 1 anterolisthesis due to prominent facet arthropathy. IMPRESSION: 1. Findings compatible with cirrhosis including recanalized umbilical vein and omental collaterals. 2. Moderate volume of ascites.  Anasarca. 3. Mild interstitial pulmonary edema. Partially visualized cardiomegaly. 4.  Aortic Atherosclerosis (ICD10-I70.0). Electronically Signed   By: Kristine Garbe M.D.   On: 12/16/2018 02:52   US Abdomen Limited Ruq  Result Date: 12/16/2018 CLINICAL DATA:  Attic cirrhosis EXAM: ULTRASOUND ABDOMEN LIMITED RIGHT UPPER QUADRANT COMPARISON:  CT from earlier today  FINDINGS: Gallbladder: Layering sludge. The gallbladder wall is thickened to 4 mm, likely reactive to the patient's cirrhosis. A small shadowing stone is seen towards the fundus. Common bile duct: Diameter: 5 mm.  Where visualized, no filling defect. Liver: Cirrhotic liver with patent and antegrade flow in the main portal vein. Moderate ascites with simple appearance. No focal liver finding. IMPRESSION: 1. Gallbladder sludge and small stone. There is gallbladder wall thickening that is likely reactive to the chronic liver disease as there is no sonographic Murphy sign. 2. Cirrhosis with moderate ascites. There is antegrade flow in the main portal vein. Electronically Signed   By: Monte Fantasia M.D.   On: 12/16/2018 06:46    Medications:   . sodium chloride   Intravenous Once  . aspirin  81 mg Oral Daily  . furosemide  20 mg Intravenous STAT  . furosemide  40 mg Oral q1800  . furosemide  80 mg Oral Daily  . ipratropium-albuterol  3 mL Nebulization BID  . magnesium  oxide  400 mg Oral Daily  . pantoprazole (PROTONIX) IV  40 mg Intravenous Q12H  . potassium chloride  40 mEq Oral Daily  . spironolactone  25 mg Oral Daily   Continuous Infusions:   LOS: 0 days   Laurelin Elson A Mikael Debell  [facility]   *Please refer to [email or web address], password [email or web address] to get updated schedule on who will round on this patient, as hospitalists switch teams weekly. If 7PM-7AM, please contact night-coverage at [email or web address], password [email or web address] for any overnight needs.

## 2018-12-17 NOTE — Progress Notes (Signed)
Received critical value for tropinin. Pt denies chest pain, no shortness of breath except on exertion.. Reported to MD. MD wants transfusion STAT. Called Blood bank to stat the allocated PRBC.

## 2018-12-17 NOTE — Progress Notes (Deleted)
Patient was admitted after midnight agree with the overall plan and assessment.  She reports that she had just recently gotten out of rehab and had not been getting her medications as prescribed.  Amlodipine is not one of her previous home medications.  She has not drank any alcohol in over 40 days.  On physical exam patient has at least 2-3+ pitting edema and mild expiratory wheeze appreciated.  Hemoglobin 9.7 which appears appears lower than previous baseline which was noted to be within normal limits.  Check stool guaiacs.  Gave patient 60 mEq of potassium chloride as repeat potassium 2.8.  Added DuoNeb's as needed.  Refer to H&P for full details.

## 2018-12-17 NOTE — Progress Notes (Addendum)
Nutrition Education Note  RD consulted for nutrition education regarding cirrhosis.  RD provided "Low Sodium Nutrition Therapy" handout from the Academy of [employer] Dietetics. Reviewed patient's dietary recall. Provided examples on ways to decrease sodium intake in diet. Discouraged intake of processed foods and use of salt shaker. Encouraged fresh fruits and vegetables as well as whole grain sources of carbohydrates to maximize fiber intake.   RD discussed why it is important for patient to adhere to diet recommendations, and emphasized the role of fluids, foods to avoid, and importance of weighing self daily. Teach back method used.  Expect good compliance.  Weight up with fluid overload. Usual weight ~167 lbs, BMI = 26.6  Albumin low at 2.8, reflects volume overload and liver dysfunction. Albumin is not a good indicator of nutrition status.   Current diet order is clear liquids, previously on low sodium diet, consuming 100% of meals. Labs and medications reviewed. No further nutrition interventions warranted at this time. RD contact information provided. If additional nutrition issues arise, please re-consult RD.   Molli Barrows, RD, LDN, [place] Pager [phone] After Hours Pager [phone]

## 2018-12-17 NOTE — Plan of Care (Signed)
  Problem: Pain Managment: Goal: General experience of comfort will improve Outcome: Progressing   Problem: Safety: Goal: Ability to remain free from injury will improve Outcome: Progressing   Problem: Skin Integrity: Goal: Risk for impaired skin integrity will decrease Outcome: Progressing   

## 2018-12-18 ENCOUNTER — Inpatient Hospital Stay (HOSPITAL_COMMUNITY): Payer: Medicare Other

## 2018-12-18 ENCOUNTER — Other Ambulatory Visit (HOSPITAL_COMMUNITY): Payer: Medicare Other

## 2018-12-18 LAB — BASIC METABOLIC PANEL
Anion gap: 8 (ref 5–15)
BUN: 8 mg/dL (ref 8–23)
CO2: 26 mmol/L (ref 22–32)
Calcium: 7.9 mg/dL — ABNORMAL LOW (ref 8.9–10.3)
Chloride: 102 mmol/L (ref 98–111)
Creatinine, Ser: 0.75 mg/dL (ref 0.44–1.00)
GFR calc Af Amer: >60 mL/min (ref >60)
GFR calc non Af Amer: >60 mL/min (ref >60)
Glucose, Bld: 100 mg/dL — ABNORMAL HIGH (ref 70–99)
Potassium: 3.3 mmol/L — ABNORMAL LOW (ref 3.5–5.1)
Sodium: 136 mmol/L (ref 135–145)

## 2018-12-18 LAB — TROPONIN I: Troponin I: 0.14 ng/mL (ref <0.03)

## 2018-12-18 LAB — CBC WITH DIFFERENTIAL/PLATELET
Abs Immature Granulocytes: 0.1 10*3/uL — ABNORMAL HIGH (ref 0.00–0.07)
Basophils Absolute: 0.1 10*3/uL (ref 0.0–0.1)
Basophils Relative: 1 %
Eosinophils Absolute: 0.1 10*3/uL (ref 0.0–0.5)
Eosinophils Relative: 1 %
HCT: 26.9 % — ABNORMAL LOW (ref 36.0–46.0)
Hemoglobin: 8.9 g/dL — ABNORMAL LOW (ref 12.0–15.0)
Immature Granulocytes: 1 %
Lymphocytes Relative: 19 %
Lymphs Abs: 1.6 10*3/uL (ref 0.7–4.0)
MCH: 31.9 pg (ref 26.0–34.0)
MCHC: 33.1 g/dL (ref 30.0–36.0)
MCV: 96.4 fL (ref 80.0–100.0)
Monocytes Absolute: 1.2 10*3/uL — ABNORMAL HIGH (ref 0.1–1.0)
Monocytes Relative: 14 %
Neutro Abs: 5.4 10*3/uL (ref 1.7–7.7)
Neutrophils Relative %: 64 %
Platelets: 79 10*3/uL — ABNORMAL LOW (ref 150–400)
RBC: 2.79 MIL/uL — ABNORMAL LOW (ref 3.87–5.11)
RDW: 18.6 % — ABNORMAL HIGH (ref 11.5–15.5)
WBC: 8.4 10*3/uL (ref 4.0–10.5)
nRBC: 0 % (ref 0.0–0.2)

## 2018-12-18 LAB — PREALBUMIN: Prealbumin: <5 mg/dL — ABNORMAL LOW (ref 18–38)

## 2018-12-18 MED ORDER — SODIUM CHLORIDE 0.9 % IV BOLUS: 500.0000 mL | Freq: Once | INTRAVENOUS | Status: DC

## 2018-12-18 MED ORDER — ACETAMINOPHEN 325 MG PO TABS
650.0000 mg | ORAL_TABLET | Freq: Four times a day (QID) | ORAL | Status: DC | PRN
  Administered 2018-12-18 – 2018-12-19 (×2): 650 mg via ORAL
  Filled 2018-12-18: qty 2

## 2018-12-18 MED ORDER — IPRATROPIUM-ALBUTEROL 0.5-2.5 (3) MG/3ML IN SOLN
3.0000 mL | Freq: Three times a day (TID) | RESPIRATORY_TRACT | Status: DC
  Administered 2018-12-19 – 2018-12-20 (×5): 3 mL via RESPIRATORY_TRACT
  Filled 2018-12-18 (×5): qty 3

## 2018-12-18 MED ORDER — IBUPROFEN 200 MG PO TABS
400.0000 mg | ORAL_TABLET | Freq: Once | ORAL | Status: AC
  Administered 2018-12-18: 400 mg via ORAL
  Filled 2018-12-18: qty 2

## 2018-12-18 MED ORDER — LIDOCAINE HCL (PF) 1 % IJ SOLN
INTRAMUSCULAR | Status: AC
  Filled 2018-12-18: qty 30

## 2018-12-18 MED ORDER — POTASSIUM CHLORIDE CRYS ER 20 MEQ PO TBCR
40.0000 meq | EXTENDED_RELEASE_TABLET | Freq: Once | ORAL | Status: AC
  Administered 2018-12-18: 40 meq via ORAL
  Filled 2018-12-18: qty 2

## 2018-12-18 NOTE — Progress Notes (Signed)
Patient c/o feeling hot and miserarble. Asking for water. VS taken. Temp 101.3, 119/66, 101, 17. Pulse ox 91 on 2 liters/[place]. MD notified. Will continue to monitor patient's status.

## 2018-12-18 NOTE — Progress Notes (Signed)
PROGRESS NOTE    Darlene Delacruz  NAT:[id number] DOB: [birth year] DOA: 12/15/2018 PCP: Janie Morning, DO   Brief Narrative:  73 year old female with history of alcoholic cirrhosis, thrombocytopenia, essential hypertension, hyperlipidemia came to the hospital for evaluation of lower extremity swelling, mild dyspnea and anasarca.  She also stated she was unable to urinate much.  She was hospitalized at an outside hospital in [place] about a month ago for what she tells [place] was pneumonia and then transition to a rehab facility.  But after going home for a day she developed the symptoms and returns to the hospital.  Patient was seen by gastroenterology here and started on diuretics.   Assessment & Plan:   Principal Problem:   Anasarca Active Problems:   HLD (hyperlipidemia)   Essential hypertension   Liver cirrhosis (HCC)   Alcohol abuse   Hypokalemia   Hypomagnesemia   Thrombocytopenia (HCC)   Normocytic anemia   Abnormal LFTs   Alcoholism (HCC)  Alcoholic liver cirrhosis Mild ascites with generalized anasarca - Plans for paracentesis today if possible-we will order cell count and cytology -Gastroenterology team is following -Continue diuretics Lasix 40 mg IV daily and Aldactone 100 mg daily -Strict input and output, daily weight - 2 g salt diet -Right upper quadrant ultrasound done-results pending  Hypokalemia -Repleted potassium  Lower GI bleed, acute blood loss anemia - Hemoglobin is stable at this time 9.0 -Continue Protonix 40 mg twice daily - Provide supportive care.  No endoscopic indication for now per GI-will defer further management to them -Avoid NSAIDs  Upper respiratory tract infection, viral - Respiratory panel is positive for rhinovirus.  Continue provide supportive care.  Urinary tract infection? - Urine culture is growing unidentified organism, will follow-up culture data -On Rocephin  Hyperlipidemia -Statin on hold due to abnormal liver  function.  Essential hypertension -On Norvasc at home.  Severe protein calorie malnutrition -Prealbumin is less than 5.  Encourage oral diet.   DVT prophylaxis: SCDs Code Status: Partial code Family Communication: None at bedside Disposition Plan: Maintain inpatient stay for further evaluation and management for anasarca  Consultants:   Gastroenterology  Procedures:   None  Antimicrobials:   Rocephin   Subjective: Patient reports of generalized feeling of being swollen but no other complaints at that time.  Review of Systems Otherwise negative except as per HPI, including: General: Denies fever, chills, night sweats or unintended weight loss. Resp: Denies cough, wheezing, shortness of breath. Cardiac: Denies chest pain, palpitations, orthopnea, paroxysmal nocturnal dyspnea. GI: Denies abdominal pain, nausea, vomiting, diarrhea or constipation GU: Denies dysuria, frequency, hesitancy or incontinence MS: Denies muscle aches, joint pain or swelling Neuro: Denies headache, neurologic deficits (focal weakness, numbness, tingling), abnormal gait Psych: Denies anxiety, depression, SI/HI/AVH Skin: Denies new rashes or lesions ID: Denies sick contacts, exotic exposures, travel  Objective: Vitals:   12/17/18 2112 12/18/18 0101 12/18/18 0500 12/18/18 0952  BP:  119/66 124/62   Pulse:  (!) 101 86 95  Resp:  17  18  Temp:  (!) 101.3 F (38.5 C) 98.3 F (36.8 C)   TempSrc:  Oral Oral   SpO2: 94% 91% 93% 94%  Weight:      Height:        Intake/Output Summary (Last 24 hours) at 12/18/2018 1128 Last data filed at 12/18/2018 1007 Gross per 24 hour  Intake 655 ml  Output 1800 ml  Net -1145 ml   Filed Weights   12/15/18 2127  Weight: 88.6 kg  Examination:  General exam: Appears calm and comfortable  Respiratory system: Bibasilar crackles heard midway up to her lungs Cardiovascular system: S1 & S2 heard, RRR. No JVD, murmurs, rubs, gallops or clicks. 3+ b/l LE  pitting edema Gastrointestinal system: Abdomen is nondistended, soft and nontender. No organomegaly or masses felt. Normal bowel sounds heard. Central nervous system: Alert and oriented. No focal neurological deficits. Extremities: Symmetric 5 x 5 power. Skin: No rashes, lesions or ulcers Psychiatry: Judgement and insight appear normal. Mood & affect appropriate.     Data Reviewed:   CBC: Recent Labs  Lab 12/15/18 2140 12/16/18 0429 12/17/18 0638 12/17/18 1514 12/18/18 0454  WBC 8.0 6.7 7.0 9.6 8.4  NEUTROABS  --   --  4.7  --  5.4  HGB 10.0* 9.7* 8.1* 9.1* 8.9*  HCT 29.1* 28.4* 24.2* 26.4* 26.9*  MCV 97.3 95.0 97.6 96.4 96.4  PLT 116* 104* 81* 82* 79*   Basic Metabolic Panel: Recent Labs  Lab 12/15/18 2140 12/16/18 0107 12/16/18 0429 12/17/18 0638 12/18/18 0454  NA 132*  --  132* 137 136  K 2.8*  --  2.8* 3.7 3.3*  CL 98  --  98 103 102  CO2 24  --  25 26 26   GLUCOSE 111*  --  101* 98 100*  BUN 11  --  10 9 8   CREATININE 0.83  --  0.87 0.76 0.75  CALCIUM 8.0*  --  7.9* 8.3* 7.9*  MG  --  1.9 1.8  --   --    GFR: Estimated Creatinine Clearance: 71.9 mL/min (by C-G formula based on SCr of 0.75 mg/dL). Liver Function Tests: Recent Labs  Lab 12/16/18 0107 12/16/18 0429 12/17/18 0638  AST 107* 59* 40  ALT 35 35 22  ALKPHOS 134* 139* 100  BILITOT 3.1* 2.7* 3.0*  PROT 6.6 6.7 6.3*  ALBUMIN 1.9* 2.0* 2.8*   No results for input(s): LIPASE, AMYLASE in the last 168 hours. Recent Labs  Lab 12/16/18 0429  AMMONIA 39*   Coagulation Profile: Recent Labs  Lab 12/16/18 0107  INR 1.39   Cardiac Enzymes: Recent Labs  Lab 12/17/18 0907 12/18/18 0454  TROPONINI 0.20* 0.14*   BNP (last 3 results) No results for input(s): PROBNP in the last 8760 hours. HbA1C: No results for input(s): HGBA1C in the last 72 hours. CBG: No results for input(s): GLUCAP in the last 168 hours. Lipid Profile: No results for input(s): CHOL, HDL, LDLCALC, TRIG, CHOLHDL,  LDLDIRECT in the last 72 hours. Thyroid Function Tests: No results for input(s): TSH, T4TOTAL, FREET4, T3FREE, THYROIDAB in the last 72 hours. Anemia Panel: No results for input(s): VITAMINB12, FOLATE, FERRITIN, TIBC, IRON, RETICCTPCT in the last 72 hours. Sepsis Labs: No results for input(s): PROCALCITON, LATICACIDVEN in the last 168 hours.  Recent Results (from the past 240 hour(s))  Respiratory Panel by PCR     Status: Abnormal   Collection Time: 12/17/18 11:29 AM  Result Value Ref Range Status   Adenovirus NOT DETECTED NOT DETECTED Final   Coronavirus 229E NOT DETECTED NOT DETECTED Final   Coronavirus HKU1 NOT DETECTED NOT DETECTED Final   Coronavirus NL63 NOT DETECTED NOT DETECTED Final   Coronavirus OC43 NOT DETECTED NOT DETECTED Final   Metapneumovirus NOT DETECTED NOT DETECTED Final   Rhinovirus / Enterovirus DETECTED (A) NOT DETECTED Final   Influenza A NOT DETECTED NOT DETECTED Final   Influenza B NOT DETECTED NOT DETECTED Final   Parainfluenza Virus 1 NOT DETECTED NOT DETECTED Final  Parainfluenza Virus 2 NOT DETECTED NOT DETECTED Final   Parainfluenza Virus 3 NOT DETECTED NOT DETECTED Final   Parainfluenza Virus 4 NOT DETECTED NOT DETECTED Final   Respiratory Syncytial Virus NOT DETECTED NOT DETECTED Final   Bordetella pertussis NOT DETECTED NOT DETECTED Final   Chlamydophila pneumoniae NOT DETECTED NOT DETECTED Final   Mycoplasma pneumoniae NOT DETECTED NOT DETECTED Final    Comment: Performed at [facility], [place] [address], [place], [place] [address]  Urine Culture     Status: Abnormal (Preliminary result)   Collection Time: 12/17/18 12:22 PM  Result Value Ref Range Status   Specimen Description URINE, CATHETERIZED  Final   Special Requests   Final    NONE Performed at [facility], [place] [address], [place], [place] [address]    Culture >=100,000 COLONIES/mL UNIDENTIFIED ORGANISM (A)  Final   Report Status PENDING  Incomplete         Radiology  Studies: No results found.      Scheduled Meds: . aspirin  81 mg Oral Daily  . furosemide  40 mg Intravenous Daily  . ipratropium-albuterol  3 mL Nebulization BID  . magnesium oxide  400 mg Oral Daily  . pantoprazole (PROTONIX) IV  40 mg Intravenous Q12H  . potassium chloride  40 mEq Oral Daily  . spironolactone  100 mg Oral Daily   Continuous Infusions: . cefTRIAXone (ROCEPHIN)  IV 200 mL/hr at 12/18/18 1007     LOS: 1 day   Time spent= 35 mins    Ankit Arsenio Loader, MD [facility]  If 7PM-7AM, please contact night-coverage [email or web address] 12/18/2018, 11:28 AM

## 2018-12-18 NOTE — Progress Notes (Addendum)
  I was present during limited US of the abdomen to evaluate for paracentesis.  There is a small amount of ascites, however there is no significant pocket of fluid to allow for safe approach for paracentesis secondary to loops of bowel.  There is a slightly larger perihepatic window.  Risks and benefits of paracentesis was discussed with the patient including, but not limited to bleeding, infection, damage to adjacent structures/bowel/bladder/liver.  All of the patient's questions were answered.  She feels risks outweigh benefit at this time and does NOT wish to proceed.  Miyoshi Ligas S Telesforo Brosnahan PA-C 12/18/2018 11:00 AM

## 2018-12-18 NOTE — Evaluation (Signed)
Physical Therapy Evaluation Patient Details Name: Darlene Delacruz MRN: [id number] DOB: 04-24-1946 Today's Date: 12/18/2018   History of Present Illness  Pt is a 73 y.o. female admitted 12/15/18 after recent d/c from SNF (12/14/18) with worsening SOB and edema. Abdominal CT showed cirrhosis with ascites. Worked up for anasarca most likely due to alcoholic liver cirrhosis. PMH includes HTN, alcohol abuse, arthritis.  Clinical Impression   Pt admitted with above diagnosis. Pt currently with functional limitations due to the deficits listed below (see PT Problem List). Managing Independently prior to initial hospitalization in early November; Presents with generalized weakness, decr activity tolerance, decr functional mobility;  Pt will benefit from skilled PT to increase their independence and safety with mobility to allow discharge to the venue listed below.    Will watch her progress, if she does not progress quickly, she may need to return to SNF (she would prfer not to go to [facility])     Follow Up Recommendations Home health PT;Supervision - Intermittent;Other (comment)(It would be most optimal to arrange for close to 24hour assist/supervision)    Equipment Recommendations  Rolling walker with 5" wheels;3in1 (PT)    Recommendations for Other Services (Will consider OT consult for ADLs)     Precautions / Restrictions Precautions Precautions: Fall Precaution Comments: Fall risk greatly reduced withuse of RW; Droplet Prec      Mobility  Bed Mobility Overal bed mobility: Needs Assistance Bed Mobility: Supine to Sit     Supine to sit: Min assist     General bed mobility comments: Min handheld assist to pull to sit  Transfers Overall transfer level: Needs assistance Equipment used: Rolling walker (2 wheeled) Transfers: Sit to/from Stand Sit to Stand: Min assist         General transfer comment: Min assis tto power up to stand; good ahnd  placement  Ambulation/Gait Ambulation/Gait assistance: Min guard Gait Distance (Feet): 5 Feet Assistive device: Rolling walker (2 wheeled) Gait Pattern/deviations: Step-through pattern     General Gait Details: Cues to self-monitor for activity tolerance  Stairs            Wheelchair Mobility    Modified Rankin (Stroke Patients Only)       Balance                                             Pertinent Vitals/Pain Pain Assessment: Faces Faces Pain Scale: Hurts a little bit Pain Location: Generalized aching associated with fever Pain Descriptors / Indicators: Aching Pain Intervention(s): Monitored during session    Home Living Family/patient expects to be discharged to:: Private residence Living Arrangements: Alone Available Help at Discharge: Friend(s);Available PRN/intermittently Type of Home: House Home Access: Stairs to enter   [employer] of Steps: 1-2(needs to be verified) Home Layout: One level        Prior Function Level of Independence: Independent         Comments: was independent leading up to first admission in early [place], leading SNF stay for rehab; was home 1 day before this admission     Hand Dominance        Extremity/Trunk Assessment   Upper Extremity Assessment Upper Extremity Assessment: Generalized weakness    Lower Extremity Assessment Lower Extremity Assessment: Generalized weakness       Communication   Communication: No difficulties  Cognition Arousal/Alertness: Awake/alert Behavior During Therapy: [facility]  for tasks assessed/performed Overall Cognitive Status: Within Functional Limits for tasks assessed                                        General Comments      Exercises     Assessment/Plan    PT Assessment Patient needs continued PT services  PT Problem List Decreased strength;Decreased activity tolerance;Decreased balance;Decreased mobility;Decreased  knowledge of use of DME;Decreased safety awareness;Decreased knowledge of precautions       PT Treatment Interventions DME instruction;Gait training;Stair training;Functional mobility training;Therapeutic activities;Therapeutic exercise;Balance training;Patient/family education    PT Goals (Current goals can be found in the Care Plan section)  Acute Rehab PT Goals Patient Stated Goal: back to being independent PT Goal Formulation: With patient Time For Goal Achievement: 01/01/19 Potential to Achieve Goals: Good    Frequency Min 3X/week   Barriers to discharge Decreased caregiver support Must be independent to go home    Co-evaluation               AM-PAC PT "6 Clicks" Mobility  Outcome Measure Help needed turning from your back to your side while in a flat bed without using bedrails?: None Help needed moving from lying on your back to sitting on the side of a flat bed without using bedrails?: A Little Help needed moving to and from a bed to a chair (including a wheelchair)?: A Little Help needed standing up from a chair using your arms (e.g., wheelchair or bedside chair)?: A Little Help needed to walk in hospital room?: A Little Help needed climbing 3-5 steps with a railing? : A Little 6 Click Score: 19    End of Session Equipment Utilized During Treatment: Gait belt Activity Tolerance: Patient tolerated treatment well Patient left: in chair;with call bell/phone within reach;with chair alarm set Nurse Communication: Mobility status PT Visit Diagnosis: Other abnormalities of gait and mobility (R26.89);Muscle weakness (generalized) (M62.81)    Time: 7530-0511 PT Time Calculation (min) (ACUTE ONLY): 18 min   Charges:   PT Evaluation $PT Eval Moderate Complexity: 1 Mod          Roney [place], [place]  Acute Rehabilitation Services Pager [phone] Office [phone]   Colletta [place] 12/18/2018, 5:25 PM

## 2018-12-19 ENCOUNTER — Inpatient Hospital Stay (HOSPITAL_COMMUNITY): Payer: Medicare Other

## 2018-12-19 DIAGNOSIS — N390 Urinary tract infection, site not specified: Secondary | ICD-10-CM

## 2018-12-19 DIAGNOSIS — I361 Nonrheumatic tricuspid (valve) insufficiency: Secondary | ICD-10-CM

## 2018-12-19 DIAGNOSIS — I351 Nonrheumatic aortic (valve) insufficiency: Secondary | ICD-10-CM

## 2018-12-19 DIAGNOSIS — F1020 Alcohol dependence, uncomplicated: Secondary | ICD-10-CM

## 2018-12-19 LAB — COMPREHENSIVE METABOLIC PANEL
ALT: 18 U/L (ref 0–44)
AST: 36 U/L (ref 15–41)
Albumin: 2.2 g/dL — ABNORMAL LOW (ref 3.5–5.0)
Alkaline Phosphatase: 88 U/L (ref 38–126)
Anion gap: 5 (ref 5–15)
BUN: 7 mg/dL — ABNORMAL LOW (ref 8–23)
CO2: 25 mmol/L (ref 22–32)
Calcium: 7.9 mg/dL — ABNORMAL LOW (ref 8.9–10.3)
Chloride: 106 mmol/L (ref 98–111)
Creatinine, Ser: 0.58 mg/dL (ref 0.44–1.00)
GFR calc Af Amer: >60 mL/min (ref >60)
GFR calc non Af Amer: >60 mL/min (ref >60)
Glucose, Bld: 94 mg/dL (ref 70–99)
Potassium: 3.9 mmol/L (ref 3.5–5.1)
Sodium: 136 mmol/L (ref 135–145)
Total Bilirubin: 3.3 mg/dL — ABNORMAL HIGH (ref 0.3–1.2)
Total Protein: 5.7 g/dL — ABNORMAL LOW (ref 6.5–8.1)

## 2018-12-19 LAB — CBC
HCT: 27.3 % — ABNORMAL LOW (ref 36.0–46.0)
Hemoglobin: 9.2 g/dL — ABNORMAL LOW (ref 12.0–15.0)
MCH: 32.7 pg (ref 26.0–34.0)
MCHC: 33.7 g/dL (ref 30.0–36.0)
MCV: 97.2 fL (ref 80.0–100.0)
Platelets: 76 10*3/uL — ABNORMAL LOW (ref 150–400)
RBC: 2.81 MIL/uL — ABNORMAL LOW (ref 3.87–5.11)
RDW: 18.3 % — ABNORMAL HIGH (ref 11.5–15.5)
WBC: 9.3 10*3/uL (ref 4.0–10.5)
nRBC: 0 % (ref 0.0–0.2)

## 2018-12-19 LAB — BPAM RBC
Blood Product Expiration Date: 202001132359
Blood Product Expiration Date: 202001132359
ISSUE DATE / TIME: 202001111057
Unit Type and Rh: 600
Unit Type and Rh: 600

## 2018-12-19 LAB — TYPE AND SCREEN
ABO/RH(D): A NEG
Antibody Screen: NEGATIVE
Unit division: 0
Unit division: 0

## 2018-12-19 LAB — URINE CULTURE: Culture: >=100000 — AB

## 2018-12-19 LAB — ECHOCARDIOGRAM COMPLETE
Height: 66.5 in
Weight: 3126 oz

## 2018-12-19 LAB — MAGNESIUM: Magnesium: 1.6 mg/dL — ABNORMAL LOW (ref 1.7–2.4)

## 2018-12-19 MED ORDER — MAGNESIUM OXIDE 400 (241.3 MG) MG PO TABS
800.0000 mg | ORAL_TABLET | ORAL | Status: AC
  Administered 2018-12-19 (×2): 800 mg via ORAL
  Filled 2018-12-19 (×2): qty 2

## 2018-12-19 MED ORDER — SODIUM CHLORIDE 0.9 % IV SOLN
1.0000 g | Freq: Four times a day (QID) | INTRAVENOUS | Status: DC
  Administered 2018-12-19 – 2018-12-20 (×5): 1 g via INTRAVENOUS
  Filled 2018-12-19 (×7): qty 1000

## 2018-12-19 NOTE — Consult Note (Signed)
Reason for Consult: Anasarca secondary to alcoholic cirrhosis Referring Physician: THP  Darlene Delacruz is an 73 y.o. female.  HPI: Darlene Delacruz is a 73 year old white female recently hospitalized for alcoholic cirrhosis and anasarca complicated by thrombocytopenia hypertension hyperlipidemia and respiratory infection thought to be from rhinovirus. She claims she was unable to urinate and had significant swelling of her feet prompting her to come to the emergency room for further evaluation. She was admitted to the hospital in [facility] for 2 weeks and then sent to [facility] for rehabilitation. Today after going home she had problems with difficulty urinating and severe edema of her feet and when abdomen prompting her to come to the emergency room here. She has been very short of breath and this seems to have worsened over the last 2-3 days. She gives a history of dark stools and her stool was found to be heme positive but an EGD was not recommended due to her compromised respiratory status with fluid overload.   Past Medical History:  Diagnosis Date  . Alcoholic cirrhosis ([place])   . Alcoholism ([place])   . Arthritis   . Hyperlipidemia   . Spastic esophagus    Past Surgical History:  Procedure Laterality Date  . ABDOMINAL HYSTERECTOMY     menorrhagia  . BUNIONECTOMY     both feet  . CARPAL TUNNEL RELEASE  10/13/2011   Procedure: CARPAL TUNNEL RELEASE;  Surgeon: Wynonia Sours, MD;  Location: [place];  Service: Orthopedics;  Laterality: Right;  . COLONOSCOPY  2018   Dr. Collene Mares   . EAR CYST EXCISION  10/13/2011   Procedure: CYST REMOVAL;  Surgeon: Wynonia Sours, MD;  Location: [place];  Service: Orthopedics;  Laterality: Right;  excision cyst possible rotator flap, debridement dip right middle finger  . left knee arthroscopy    . right rotator cuff repair     Family History  Problem Relation Age of Onset  . Heart failure Mother    Social  History:  reports that she quit smoking about 27 years ago. She has never used smokeless tobacco. She reports that she does not drink alcohol or use drugs.  Allergies: No Known Allergies  Medications: I have reviewed the patient's current medications.  Results for orders placed or performed during the hospital encounter of 12/15/18 (from the past 48 hour(s))  Prealbumin     Status: Abnormal   Collection Time: 12/18/18  4:54 AM  Result Value Ref Range   Prealbumin <5 (L) 18 - 38 mg/dL    Comment: REPEATED TO VERIFY Performed at [facility], [address] [place], [place] [address]   Basic metabolic panel     Status: Abnormal   Collection Time: 12/18/18  4:54 AM  Result Value Ref Range   Sodium 136 135 - 145 mmol/L   Potassium 3.3 (L) 3.5 - 5.1 mmol/L   Chloride 102 98 - 111 mmol/L   CO2 26 22 - 32 mmol/L   Glucose, Bld 100 (H) 70 - 99 mg/dL   BUN 8 8 - 23 mg/dL   Creatinine, Ser 0.75 0.44 - 1.00 mg/dL   Calcium 7.9 (L) 8.9 - 10.3 mg/dL   GFR calc non Af Amer >60 >60 mL/min   GFR calc Af Amer >60 >60 mL/min   Anion gap 8 5 - 15    Comment: Performed at [facility], [place] [address] [place],  [address]  CBC with Differential/Platelet  Status: Abnormal   Collection Time: 12/18/18  4:54 AM  Result Value Ref Range   WBC 8.4 4.0 - 10.5 K/uL   RBC 2.79 (L) 3.87 - 5.11 MIL/uL   Hemoglobin 8.9 (L) 12.0 - 15.0 g/dL   HCT 26.9 (L) 36.0 - 46.0 %   MCV 96.4 80.0 - 100.0 fL   MCH 31.9 26.0 - 34.0 pg   MCHC 33.1 30.0 - 36.0 g/dL   RDW 18.6 (H) 11.5 - 15.5 %   Platelets 79 (L) 150 - 400 K/uL    Comment: Immature Platelet Fraction may be clinically indicated, consider ordering this additional test [id number]    nRBC 0.0 0.0 - 0.2 %   Neutrophils Relative % 64 %   Neutro Abs 5.4 1.7 - 7.7 K/uL   Lymphocytes Relative 19 %   Lymphs Abs 1.6 0.7 - 4.0 K/uL   Monocytes Relative 14 %   Monocytes Absolute 1.2 (H) 0.1 - 1.0 K/uL   Eosinophils Relative 1 %    Eosinophils Absolute 0.1 0.0 - 0.5 K/uL   Basophils Relative 1 %   Basophils Absolute 0.1 0.0 - 0.1 K/uL   Immature Granulocytes 1 %   Abs Immature Granulocytes 0.10 (H) 0.00 - 0.07 K/uL    Comment: Performed at [facility], [address] [place], [place] [address]  Troponin I - Add-On to previous collection     Status: Abnormal   Collection Time: 12/18/18  4:54 AM  Result Value Ref Range   Troponin I 0.14 (HH) <0.03 ng/mL    Comment: CRITICAL VALUE NOTED.  VALUE IS CONSISTENT WITH PREVIOUSLY REPORTED AND CALLED VALUE. Performed at [facility], [place] [address] [place], [place] [address]   Comprehensive metabolic panel     Status: Abnormal   Collection Time: 12/19/18  2:24 AM  Result Value Ref Range   Sodium 136 135 - 145 mmol/L   Potassium 3.9 3.5 - 5.1 mmol/L   Chloride 106 98 - 111 mmol/L   CO2 25 22 - 32 mmol/L   Glucose, Bld 94 70 - 99 mg/dL   BUN 7 (L) 8 - 23 mg/dL   Creatinine, Ser 0.58 0.44 - 1.00 mg/dL   Calcium 7.9 (L) 8.9 - 10.3 mg/dL   Total Protein 5.7 (L) 6.5 - 8.1 g/dL   Albumin 2.2 (L) 3.5 - 5.0 g/dL   AST 36 15 - 41 U/L   ALT 18 0 - 44 U/L   Alkaline Phosphatase 88 38 - 126 U/L   Total Bilirubin 3.3 (H) 0.3 - 1.2 mg/dL   GFR calc non Af Amer >60 >60 mL/min   GFR calc Af Amer >60 >60 mL/min   Anion gap 5 5 - 15    Comment: Performed at [place] [address] [place], [place] [address]  Magnesium     Status: Abnormal   Collection Time: 12/19/18  2:24 AM  Result Value Ref Range   Magnesium 1.6 (L) 1.7 - 2.4 mg/dL    Comment: Performed at [facility] [address] [place],  [address]  CBC     Status: Abnormal   Collection Time: 12/19/18  2:24 AM  Result Value Ref Range   WBC 9.3 4.0 - 10.5 K/uL   RBC 2.81 (L) 3.87 - 5.11 MIL/uL   Hemoglobin 9.2 (L) 12.0 - 15.0 g/dL   HCT 27.3 (L) 36.0 - 46.0 %   MCV 97.2 80.0 - 100.0 fL   MCH 32.7  26.0 - 34.0 pg   MCHC 33.7 30.0 - 36.0 g/dL   RDW 18.3 (H) 11.5 - 15.5 %    Platelets 76 (L) 150 - 400 K/uL    Comment: REPEATED TO VERIFY Immature Platelet Fraction may be clinically indicated, consider ordering this additional test [id number] CONSISTENT WITH PREVIOUS RESULT    nRBC 0.0 0.0 - 0.2 %    Comment: Performed at [facility], [place] [address] [place], [place] [address]    US Abdomen Limited  Result Date: 12/18/2018 CLINICAL DATA:  73 year old female with cirrhosis and ascites. EXAM: LIMITED ABDOMEN ULTRASOUND FOR ASCITES TECHNIQUE: Limited ultrasound survey for ascites was performed in all four abdominal quadrants. COMPARISON:  None. FINDINGS: Sonographic interrogation of the 4 quadrants of the abdomen demonstrates small volume ascites. There was borderline sufficient fluid for paracentesis. The patient deferred intervention at this time. IMPRESSION: Relatively mild ascites.  Paracentesis was deferred. Electronically Signed   By: Jacqulynn Cadet M.D.   On: 12/18/2018 11:48   Review of Systems  Constitutional: Positive for malaise/fatigue.  HENT: Negative.   Eyes: Negative.   Respiratory: Positive for cough, sputum production and shortness of breath.   Cardiovascular: Positive for leg swelling.  Gastrointestinal: Positive for melena. Negative for abdominal pain, blood in stool, constipation, diarrhea, heartburn, nausea and vomiting.  Genitourinary: Negative.   Musculoskeletal: Positive for back pain and joint pain.  Skin: Negative.   Neurological: Positive for weakness.  Endo/Heme/Allergies: Bruises/bleeds easily.  Psychiatric/Behavioral: Positive for substance abuse. Negative for depression, hallucinations, memory loss and suicidal ideas. The patient is nervous/anxious. The patient does not have insomnia.    Blood pressure 124/62, pulse 97, temperature 98.7 F (37.1 C), temperature source Oral, resp. rate 20, height 5' 6.5" (1.689 m), weight 88.6 kg, SpO2 96 %. Physical Exam  Constitutional: She appears well-developed and well-nourished.   HENT:  Head: Normocephalic and atraumatic.  Eyes: Pupils are equal, round, and reactive to light. Conjunctivae and EOM are normal.  Neck: Normal range of motion. Neck supple.  Cardiovascular: Normal rate, regular rhythm, S1 normal and S2 normal.  Murmur heard.  Systolic murmur is present. Respiratory: Tachypnea noted. She is in respiratory distress. She has rhonchi. She has rales.  GI: Soft. Normal appearance and normal aorta. She exhibits distension. She exhibits no fluid wave. There is no abdominal tenderness.  Musculoskeletal:     Right shoulder: She exhibits no swelling.     Right ankle: She exhibits swelling.     Left ankle: She exhibits swelling.     Comments: 3+ pitting edema bilaterally in both lower extremities   Assessment/Plan: 1) Guaiac positive stool ?and melena with anemia: due to the patient's compromised respiratory status I think we would hold off on doing any GI workup at this time to her anasarca improves and her breathing is not so labored. 2)  Alcoholic cirrhosis, thrombocytopenia with anasarca on diuretics-Aldactone and Lasix. 3) Acute rhinovirus infection.  4) Hypertension/Hyperlipidemia. Darlene Delacruz 12/19/2018, 3:33 PM

## 2018-12-19 NOTE — Progress Notes (Signed)
  Echocardiogram 2D Echocardiogram has been performed.  Darlene Delacruz 12/19/2018, 12:44 PM

## 2018-12-19 NOTE — Consult Note (Signed)
[facility], [place] CM Primary Care Navigator  12/19/2018  Darlene Delacruz 05/14/1946 [id number]   Met withpatientat the bedsidetoidentify possible discharge needs. Patientreportsthat she came to the hospital for evaluation of lower extremity swelling and mild dyspnea as well as "unable to urinate" that had led to this admission. (anasarca, alcoholism, liver cirrhosis, hypokalemia, lower GI bleed, acute blood loss anemia, Thrombocytopenia, UTI, upper respiratory tract infection- viral)  PatientendorsesDr.Dana Theda Sers with [facility] primary care provider.   [facility] [address] and [facility] to obtain medications without difficulty.  Patientreportsthat she has been managing her own medications at [facility] use of "pillbox"system filled once a week.  Patient states thatshehasbeen drivingprior to admission but her son Darlene Delacruz), daughter Darlene Delacruz) and good friend Darlene Delacruz) will be able toprovide transportation toherdoctors' appointments after discharge.  Patientverbalized living alone, but has a neighbor that assists with her needs and who sleeps with her at nights.  Anticipated plan for discharge ishomewith home health servicesaccording to patient.  Patienthad voiced understandingto callprimary care provider's office onceshe gets home,for a post discharge follow-upvisit within 1- 2 weeksor sooner if needs arise. Patient letter (with PCP's contact number) was provided asareminder.  Explained topatientaboutTHN CM services available for health management andresourcesat homebutshe denies any needs orconcernsat thistime.Patient states having no pressing issues so far but will need to check blood pressures regularly, record readings and bring results to provider on her visit. Counseled patient on drinking alcohol and she reports that she had quit since Nov. 30th.    Patient verbalizedunderstandingto seekreferral from primary care provider to [facility] care management ifdeemed necessary and appropriatefor anyservicesin thenear future.  [facility] care management information was provided for future needs thatshe may have.  Patienthowever,verbally agreedand optedforEMMIcalls tofollow-up with herrecovery at home.   Referral made for [facility] General calls after discharge.   For additional questions please contact:  Edwena Felty A. Barnes Florek, BSN, RN-BC [facility] PRIMARY CARE Navigator Cell: [phone]

## 2018-12-19 NOTE — Progress Notes (Signed)
PROGRESS NOTE    Darlene Delacruz  PYP:[id number] DOB: 1946/09/23 DOA: 12/15/2018 PCP: Janie Morning, DO   Brief Narrative:  73 year old female with history of alcoholic cirrhosis, thrombocytopenia, essential hypertension, hyperlipidemia came to the hospital for evaluation of lower extremity swelling, mild dyspnea and anasarca.  She also stated she was unable to urinate much.  She was hospitalized at an outside hospital in [place] about a month ago for what she tells [place] was pneumonia and then transition to a rehab facility.  But after going home for a day she developed the symptoms and returns to the hospital.  Patient was seen by gastroenterology here and started on diuretics.   Assessment & Plan:   Principal Problem:   Anasarca Active Problems:   HLD (hyperlipidemia)   Essential hypertension   Liver cirrhosis (HCC)   Alcohol abuse   Hypokalemia   Hypomagnesemia   Thrombocytopenia (HCC)   Normocytic anemia   Abnormal LFTs   Alcoholism (HCC)  Alcoholic liver cirrhosis Mild ascites with generalized anasarca - Plans for paracentesis today if possible-we will order cell count and cytology -Gastroenterology team is following -Currently on Lasix 40 mg IV daily, Aldactone 100 mg orally daily -Strict input and output, daily weight - 2 g salt diet -Right upper quadrant ultrasound done- mild ascites.  Hypokalemia -Repleted potassium  Lower GI bleed, acute blood loss anemia Thrombocytopenia - Hemoglobin is stable at this time 9.0.  Today 9.2. -Continue Protonix 40 mg twice daily - Provide supportive care.  Awaiting input from gastroenterology. -Avoid NSAIDs -Low platelet count secondary to cirrhosis.  Upper respiratory tract infection, viral Abnormal breath sounds - Respiratory panel is positive for rhinovirus.  Continue provide supportive care. -Start bronchodilators, incentive spirometry and flutter valve.  Urinary tract infection, enterococcus faecalis - Discontinue  Rocephin, start patient on ampicillin.  Hyperlipidemia -Statin on hold due to abnormal liver function.  Essential hypertension -On Norvasc at home.  Severe protein calorie malnutrition -Prealbumin is less than 5.  Encourage oral diet.  DVT prophylaxis: SCDs Code Status: Partial code Family Communication: None at bedside Disposition Plan: Maintain inpatient stay until cleared by gastroenterology and her breath sounds are improved  Consultants:   Gastroenterology  Procedures:   None  Antimicrobials:   Rocephin stopped 1/13  Unasyn started 1/13   Subjective: Patient reports of generalized feeling of being swollen but no other complaints at that time.  Review of Systems Otherwise negative except as per HPI, including: General: Denies fever, chills, night sweats or unintended weight loss. Resp: Denies hemotypsis  Cardiac: Denies chest pain, palpitations, orthopnea, paroxysmal nocturnal dyspnea. GI: Denies abdominal pain, nausea, vomiting, diarrhea or constipation GU: Denies dysuria, frequency, hesitancy or incontinence MS: Denies muscle aches, joint pain or swelling Neuro: Denies headache, neurologic deficits (focal weakness, numbness, tingling), abnormal gait Psych: Denies anxiety, depression, SI/HI/AVH Skin: Denies new rashes or lesions ID: Denies sick contacts, exotic exposures, travel  Objective: Vitals:   12/19/18 0507 12/19/18 0900 12/19/18 0943 12/19/18 1000  BP: 133/64  124/62   Pulse: 91  97   Resp: 20     Temp: 98.7 F (37.1 C)     TempSrc: Oral     SpO2: 99% 98% 96% 96%  Weight:      Height:        Intake/Output Summary (Last 24 hours) at 12/19/2018 1204 Last data filed at 12/19/2018 0400 Gross per 24 hour  Intake 3932.39 ml  Output 895 ml  Net 3037.39 ml   [employer]  12/15/18 2127  Weight: 88.6 kg    Examination: Constitutional: NAD, calm, comfortable Eyes: PERRL, lids and conjunctivae normal ENMT: Mucous membranes are moist.  Posterior pharynx clear of any exudate or lesions.Normal dentition.  Neck: normal, supple, no masses, no thyromegaly Respiratory: Diffuse coarse breath sounds Cardiovascular: Regular rate and rhythm, no murmurs / rubs / gallops. No extremity edema. 2+ pedal pulses. No carotid bruits.  Abdomen: no tenderness, no masses palpated. No hepatosplenomegaly. Bowel sounds positive.  Musculoskeletal: no clubbing / cyanosis. No joint deformity upper and lower extremities. Good ROM, no contractures. Normal muscle tone.  Skin: no rashes, lesions, ulcers. No induration Neurologic: CN 2-12 grossly intact. Sensation intact, DTR normal. Strength 5/5 in all 4.  Psychiatric: Normal judgment and insight. Alert and oriented x 3. Normal mood.    Data Reviewed:   CBC: Recent Labs  Lab 12/16/18 0429 12/17/18 0638 12/17/18 1514 12/18/18 0454 12/19/18 0224  WBC 6.7 7.0 9.6 8.4 9.3  NEUTROABS  --  4.7  --  5.4  --   HGB 9.7* 8.1* 9.1* 8.9* 9.2*  HCT 28.4* 24.2* 26.4* 26.9* 27.3*  MCV 95.0 97.6 96.4 96.4 97.2  PLT 104* 81* 82* 79* 76*   Basic Metabolic Panel: Recent Labs  Lab 12/15/18 2140 12/16/18 0107 12/16/18 0429 12/17/18 0638 12/18/18 0454 12/19/18 0224  NA 132*  --  132* 137 136 136  K 2.8*  --  2.8* 3.7 3.3* 3.9  CL 98  --  98 103 102 106  CO2 24  --  25 26 26 25   GLUCOSE 111*  --  101* 98 100* 94  BUN 11  --  10 9 8  7*  CREATININE 0.83  --  0.87 0.76 0.75 0.58  CALCIUM 8.0*  --  7.9* 8.3* 7.9* 7.9*  MG  --  1.9 1.8  --   --  1.6*   GFR: Estimated Creatinine Clearance: 71.9 mL/min (by C-G formula based on SCr of 0.58 mg/dL). Liver Function Tests: Recent Labs  Lab 12/16/18 0107 12/16/18 0429 12/17/18 0638 12/19/18 0224  AST 107* 59* 40 36  ALT 35 35 22 18  ALKPHOS 134* 139* 100 88  BILITOT 3.1* 2.7* 3.0* 3.3*  PROT 6.6 6.7 6.3* 5.7*  ALBUMIN 1.9* 2.0* 2.8* 2.2*   No results for input(s): LIPASE, AMYLASE in the last 168 hours. Recent Labs  Lab 12/16/18 0429  AMMONIA 39*    Coagulation Profile: Recent Labs  Lab 12/16/18 0107  INR 1.39   Cardiac Enzymes: Recent Labs  Lab 12/17/18 0907 12/18/18 0454  TROPONINI 0.20* 0.14*   BNP (last 3 results) No results for input(s): PROBNP in the last 8760 hours. HbA1C: No results for input(s): HGBA1C in the last 72 hours. CBG: No results for input(s): GLUCAP in the last 168 hours. Lipid Profile: No results for input(s): CHOL, HDL, LDLCALC, TRIG, CHOLHDL, LDLDIRECT in the last 72 hours. Thyroid Function Tests: No results for input(s): TSH, T4TOTAL, FREET4, T3FREE, THYROIDAB in the last 72 hours. Anemia Panel: No results for input(s): VITAMINB12, FOLATE, FERRITIN, TIBC, IRON, RETICCTPCT in the last 72 hours. Sepsis Labs: No results for input(s): PROCALCITON, LATICACIDVEN in the last 168 hours.  Recent Results (from the past 240 hour(s))  Respiratory Panel by PCR     Status: Abnormal   Collection Time: 12/17/18 11:29 AM  Result Value Ref Range Status   Adenovirus NOT DETECTED NOT DETECTED Final   Coronavirus 229E NOT DETECTED NOT DETECTED Final   Coronavirus HKU1 NOT DETECTED NOT DETECTED Final  Coronavirus NL63 NOT DETECTED NOT DETECTED Final   Coronavirus OC43 NOT DETECTED NOT DETECTED Final   Metapneumovirus NOT DETECTED NOT DETECTED Final   Rhinovirus / Enterovirus DETECTED (A) NOT DETECTED Final   Influenza A NOT DETECTED NOT DETECTED Final   Influenza B NOT DETECTED NOT DETECTED Final   Parainfluenza Virus 1 NOT DETECTED NOT DETECTED Final   Parainfluenza Virus 2 NOT DETECTED NOT DETECTED Final   Parainfluenza Virus 3 NOT DETECTED NOT DETECTED Final   Parainfluenza Virus 4 NOT DETECTED NOT DETECTED Final   Respiratory Syncytial Virus NOT DETECTED NOT DETECTED Final   Bordetella pertussis NOT DETECTED NOT DETECTED Final   Chlamydophila pneumoniae NOT DETECTED NOT DETECTED Final   Mycoplasma pneumoniae NOT DETECTED NOT DETECTED Final    Comment: Performed at [facility], [place] [address], [place], [place] [address]  Urine Culture     Status: Abnormal   Collection Time: 12/17/18 12:22 PM  Result Value Ref Range Status   Specimen Description URINE, CATHETERIZED  Final   Special Requests   Final    NONE Performed at [facility], [place] [address], [place], [place] [address]    Culture >=100,000 COLONIES/mL ENTEROCOCCUS FAECALIS (A)  Final   Report Status 12/19/2018 FINAL  Final   Organism ID, Bacteria ENTEROCOCCUS FAECALIS (A)  Final      Susceptibility   Enterococcus faecalis - MIC*    AMPICILLIN <=2 SENSITIVE Sensitive     LEVOFLOXACIN 1 SENSITIVE Sensitive     NITROFURANTOIN <=16 SENSITIVE Sensitive     VANCOMYCIN 1 SENSITIVE Sensitive     * >=100,000 COLONIES/mL ENTEROCOCCUS FAECALIS         Radiology Studies: US Abdomen Limited  Result Date: 12/18/2018 CLINICAL DATA:  73 year old female with cirrhosis and ascites. EXAM: LIMITED ABDOMEN ULTRASOUND FOR ASCITES TECHNIQUE: Limited ultrasound survey for ascites was performed in all four abdominal quadrants. COMPARISON:  None. FINDINGS: Sonographic interrogation of the 4 quadrants of the abdomen demonstrates small volume ascites. There was borderline sufficient fluid for paracentesis. The patient deferred intervention at this time. IMPRESSION: Relatively mild ascites.  Paracentesis was deferred. Electronically Signed   By: Jacqulynn Cadet M.D.   On: 12/18/2018 11:48        Scheduled Meds: . aspirin  81 mg Oral Daily  . furosemide  40 mg Intravenous Daily  . ipratropium-albuterol  3 mL Nebulization TID  . magnesium oxide  400 mg Oral Daily  . magnesium oxide  800 mg Oral Q4H  . pantoprazole (PROTONIX) IV  40 mg Intravenous Q12H  . potassium chloride  40 mEq Oral Daily  . spironolactone  100 mg Oral Daily   Continuous Infusions: . cefTRIAXone (ROCEPHIN)  IV 1 g (12/19/18 0958)     LOS: 2 days   Time spent=25 mins    Garek Schuneman Arsenio Loader, MD [facility]  If 7PM-7AM, please contact  night-coverage [email or web address] 12/19/2018, 12:04 PM

## 2018-12-20 LAB — COMPREHENSIVE METABOLIC PANEL
ALT: 19 U/L (ref 0–44)
AST: 38 U/L (ref 15–41)
Albumin: 2.2 g/dL — ABNORMAL LOW (ref 3.5–5.0)
Alkaline Phosphatase: 88 U/L (ref 38–126)
Anion gap: 6 (ref 5–15)
BUN: <5 mg/dL — ABNORMAL LOW (ref 8–23)
CO2: 24 mmol/L (ref 22–32)
Calcium: 8.1 mg/dL — ABNORMAL LOW (ref 8.9–10.3)
Chloride: 107 mmol/L (ref 98–111)
Creatinine, Ser: 0.63 mg/dL (ref 0.44–1.00)
GFR calc Af Amer: >60 mL/min (ref >60)
GFR calc non Af Amer: >60 mL/min (ref >60)
Glucose, Bld: 91 mg/dL (ref 70–99)
Potassium: 4 mmol/L (ref 3.5–5.1)
Sodium: 137 mmol/L (ref 135–145)
Total Bilirubin: 3.6 mg/dL — ABNORMAL HIGH (ref 0.3–1.2)
Total Protein: 6.2 g/dL — ABNORMAL LOW (ref 6.5–8.1)

## 2018-12-20 LAB — CBC
HCT: 31.3 % — ABNORMAL LOW (ref 36.0–46.0)
Hemoglobin: 10.6 g/dL — ABNORMAL LOW (ref 12.0–15.0)
MCH: 33.7 pg (ref 26.0–34.0)
MCHC: 33.9 g/dL (ref 30.0–36.0)
MCV: 99.4 fL (ref 80.0–100.0)
Platelets: 92 10*3/uL — ABNORMAL LOW (ref 150–400)
RBC: 3.15 MIL/uL — ABNORMAL LOW (ref 3.87–5.11)
RDW: 18.6 % — ABNORMAL HIGH (ref 11.5–15.5)
WBC: 10.2 10*3/uL (ref 4.0–10.5)
nRBC: 0 % (ref 0.0–0.2)

## 2018-12-20 LAB — MAGNESIUM: Magnesium: 1.6 mg/dL — ABNORMAL LOW (ref 1.7–2.4)

## 2018-12-20 MED ORDER — AMOXICILLIN-POT CLAVULANATE 875-125 MG PO TABS
1.0000 | ORAL_TABLET | Freq: Two times a day (BID) | ORAL | Status: DC
  Administered 2018-12-20 – 2018-12-22 (×4): 1 via ORAL
  Filled 2018-12-20 (×4): qty 1

## 2018-12-20 MED ORDER — ALBUTEROL SULFATE (2.5 MG/3ML) 0.083% IN NEBU: 2.5000 mg | INHALATION_SOLUTION | RESPIRATORY_TRACT | Status: DC | PRN

## 2018-12-20 MED ORDER — IPRATROPIUM-ALBUTEROL 0.5-2.5 (3) MG/3ML IN SOLN
3.0000 mL | Freq: Two times a day (BID) | RESPIRATORY_TRACT | Status: DC
  Administered 2018-12-20 – 2018-12-22 (×4): 3 mL via RESPIRATORY_TRACT
  Filled 2018-12-20 (×5): qty 3

## 2018-12-20 MED ORDER — MAGNESIUM OXIDE 400 (241.3 MG) MG PO TABS
800.0000 mg | ORAL_TABLET | Freq: Three times a day (TID) | ORAL | Status: AC
  Administered 2018-12-20 (×3): 800 mg via ORAL
  Filled 2018-12-20 (×3): qty 2

## 2018-12-20 MED ORDER — GUAIFENESIN-DM 100-10 MG/5ML PO SYRP
5.0000 mL | ORAL_SOLUTION | ORAL | Status: DC | PRN
  Administered 2018-12-20: 5 mL via ORAL
  Filled 2018-12-20 (×2): qty 5

## 2018-12-20 MED ORDER — FUROSEMIDE 80 MG PO TABS
80.0000 mg | ORAL_TABLET | Freq: Two times a day (BID) | ORAL | Status: DC
  Administered 2018-12-20 – 2018-12-22 (×4): 80 mg via ORAL
  Filled 2018-12-20: qty 1
  Filled 2018-12-20: qty 2
  Filled 2018-12-20 (×2): qty 1

## 2018-12-20 MED ORDER — FUROSEMIDE 10 MG/ML IJ SOLN
40.0000 mg | Freq: Two times a day (BID) | INTRAMUSCULAR | Status: DC
  Administered 2018-12-20: 40 mg via INTRAVENOUS
  Filled 2018-12-20: qty 4

## 2018-12-20 NOTE — Progress Notes (Signed)
Subjective: Coughing, wheezing   Objective: Vital signs in last 24 hours: Temp:  [98.2 F (36.8 C)-98.8 F (37.1 C)] 98.6 F (37 C) (01/14 1135) Pulse Rate:  [86-101] 92 (01/14 1135) Resp:  [16-20] 20 (01/14 1135) BP: (121-142)/(59-66) 142/66 (01/14 1135) SpO2:  [90 %-98 %] 98 % (01/14 1406) Weight:  [87 kg] 87 kg (01/14 1022) Last BM Date: 12/19/18  Intake/Output from previous day: 01/13 0701 - 01/14 0700 In: 480 [P.O.:380; IV Piggyback:100] Out: 2450 [Urine:2450] Intake/Output this shift: Total I/O In: -  Out: 1200 [Urine:1200]  General appearance: alert and mild distress Resp: end-expiratory wheezing, rhonchi, decreased breath sound in the left lung field Cardio: regular rate and rhythm GI: soft, non-tender; bowel sounds normal; no masses,  no organomegaly  Lab Results: Recent Labs    12/18/18 0454 12/19/18 0224 12/20/18 0430  WBC 8.4 9.3 10.2  HGB 8.9* 9.2* 10.6*  HCT 26.9* 27.3* 31.3*  PLT 79* 76* 92*   BMET Recent Labs    12/18/18 0454 12/19/18 0224 12/20/18 0430  NA 136 136 137  K 3.3* 3.9 4.0  CL 102 106 107  CO2 26 25 24   GLUCOSE 100* 94 91  BUN 8 7* <5*  CREATININE 0.75 0.58 0.63  CALCIUM 7.9* 7.9* 8.1*   LFT Recent Labs    12/20/18 0430  PROT 6.2*  ALBUMIN 2.2*  AST 38  ALT 19  ALKPHOS 88  BILITOT 3.6*   PT/INR No results for input(s): LABPROT, INR in the last 72 hours. Hepatitis Panel No results for input(s): HEPBSAG, HCVAB, HEPAIGM, HEPBIGM in the last 72 hours. C-Diff No results for input(s): CDIFFTOX in the last 72 hours. Fecal Lactopherrin No results for input(s): FECLLACTOFRN in the last 72 hours.  Studies/Results: No results found.  Medications:  Scheduled: . amoxicillin-clavulanate  1 tablet Oral Q12H  . aspirin  81 mg Oral Daily  . furosemide  80 mg Oral BID  . ipratropium-albuterol  3 mL Nebulization BID  . magnesium oxide  400 mg Oral Daily  . magnesium oxide  800 mg Oral TID  . pantoprazole (PROTONIX) IV  40  mg Intravenous Q12H  . potassium chloride  40 mEq Oral Daily  . spironolactone  100 mg Oral Daily   Continuous:   Assessment/Plan: 1) Pneumonia. 2) GI bleed. 3) Cirrhosis.   She is improving, but ascultation of her lungs still exhibits significant rhonchi and wheezing.  Her HGB is stable.  It may be prudent to perform any endoscopic evaluation as an outpatient, provided that her HGB continues to remain stable.  Plan: 1) Continue with supportive care  LOS: 3 days   Darlene Delacruz D 12/20/2018, 5:22 PM

## 2018-12-20 NOTE — Progress Notes (Signed)
Midline unsuccessful, RN made aware and will contact MD about alternative access. [employer]

## 2018-12-20 NOTE — Care Management Important Message (Signed)
Important Message  Patient Details  Name: Darlene Delacruz MRN: [id number] Date of Birth: 1946/08/05   Medicare Important Message Given:  Yes    Orbie Pyo 12/20/2018, 3:51 PM

## 2018-12-20 NOTE — Progress Notes (Signed)
PROGRESS NOTE    Darlene Delacruz  PIR:[id number] DOB: [birth year] DOA: 12/15/2018 PCP: Janie Morning, DO   Brief Narrative:  73 year old female with history of alcoholic cirrhosis, thrombocytopenia, essential hypertension, hyperlipidemia came to the hospital for evaluation of lower extremity swelling, mild dyspnea and anasarca.  She also stated she was unable to urinate much.  She was hospitalized at an outside hospital in [place] about a month ago for what she tells [place] was pneumonia and then transition to a rehab facility.  But after going home for a day she developed the symptoms and returns to the hospital.  Patient was seen by gastroenterology here and started on diuretics.   Assessment & Plan:   Principal Problem:   Anasarca Active Problems:   HLD (hyperlipidemia)   Essential hypertension   Liver cirrhosis (HCC)   Alcohol abuse   Hypokalemia   Hypomagnesemia   Thrombocytopenia (HCC)   Normocytic anemia   Abnormal LFTs   Alcoholism (HCC)  Alcoholic liver cirrhosis Mild ascites with generalized anasarca - Unable to do paracentesis due to not enough ascites and a pocket. -Gastroenterology team is following -Increase Lasix 40mg  IV BID, Aldactone 100 mg orally daily -Strict input and output, daily weight - 2 g salt diet -Right upper quadrant ultrasound done- mild ascites.  Hypokalemia, hypomagnesemia -Replete electrolytes as necessary  Lower GI bleed, acute blood loss anemia Thrombocytopenia, slightly improved - Hemoglobin is stable at this time 9.0.  Hemoglobin stable at 10.6 -Continue Protonix twice daily - Provide supportive care.  Awaiting input from gastroenterology. -Avoid NSAIDs -Low platelet count secondary to cirrhosis.  Upper respiratory tract infection, viral Abnormal breath sounds with mild hypoxia, 87% on room air - Respiratory panel is positive for rhinovirus.  Continue provide supportive care. -Continue bronchodilators, incentive spirometry and  flutter valve.  Urinary tract infection, enterococcus faecalis - Discontinue Rocephin, continue ampicillin day 2  Hyperlipidemia -LFTs improved, should be able to resume statin upon discharge?.  Essential hypertension -On Norvasc at home.  Severe protein calorie malnutrition -Prealbumin is less than 5.  Encourage oral diet.  Physical therapy recommends home health  DVT prophylaxis: SCDs Code Status: Partial code Family Communication: None at bedside Disposition Plan: Maintain in hospital stay until her respiratory status is better.  Consultants:   Gastroenterology  Procedures:   None  Antimicrobials:   Rocephin stopped 1/13  Unasyn started 1/13   Subjective: Patient has significant amount of coughing and coarse breath sound.  She is also bringing up cough.  Tells me she feels terrible in the sense that she is not able to move around much without getting short of breath.  As long as she sitting still she feels okay.  Review of Systems Otherwise negative except as per HPI, including: General = no fevers, chills, dizziness, malaise, fatigue HEENT/EYES = negative for pain, redness, loss of vision, double vision, blurred vision, loss of hearing, sore throat, hoarseness, dysphagia Cardiovascular= negative for chest pain, palpitation, murmurs, lower extremity swelling Respiratory/lungs= negative forhemoptysis, mucus production Gastrointestinal= negative for nausea, vomiting,, abdominal pain, melena, hematemesis Genitourinary= negative for Dysuria, Hematuria, Change in Urinary Frequency MSK = Negative for arthralgia, myalgias, Back Pain, Joint swelling  Neurology= Negative for headache, seizures, numbness, tingling  Psychiatry= Negative for anxiety, depression, suicidal and homocidal ideation Allergy/Immunology= Medication/Food allergy as listed  Skin= Negative for Rash, lesions, ulcers, itching    Objective: Vitals:   12/20/18 0723 12/20/18 0853 12/20/18 1022 12/20/18  1135  BP:  (!) 121/59  (!) 142/66  Pulse:  88  92  Resp:    20  Temp:  98.8 F (37.1 C)  98.6 F (37 C)  TempSrc:    Axillary  SpO2: 91% 92%  98%  Weight:   87 kg   Height:        Intake/Output Summary (Last 24 hours) at 12/20/2018 1215 Last data filed at 12/20/2018 0900 Gross per 24 hour  Intake 240 ml  Output 3650 ml  Net -3410 ml   [employer]   12/15/18 2127 12/20/18 1022  Weight: 88.6 kg 87 kg    Examination: Constitutional: NAD, calm, comfortable Eyes: PERRL, lids and conjunctivae normal ENMT: Mucous membranes are moist. Posterior pharynx clear of any exudate or lesions.Normal dentition.  Neck: normal, supple, no masses, no thyromegaly Respiratory: Diffuse coarse breath sounds Cardiovascular: Regular rate and rhythm, no murmurs / rubs / gallops.  3+ lower extremity bilateral pitting edema. 2+ pedal pulses. No carotid bruits.  Abdomen: no tenderness, no masses palpated. No hepatosplenomegaly. Bowel sounds positive.  Musculoskeletal: no clubbing / cyanosis. No joint deformity upper and lower extremities. Good ROM, no contractures. Normal muscle tone.  Skin: no rashes, lesions, ulcers. No induration Neurologic: CN 2-12 grossly intact. Sensation intact, DTR normal. Strength 5/5 in all 4.  Psychiatric: Normal judgment and insight. Alert and oriented x 3. Normal mood.    Data Reviewed:   CBC: Recent Labs  Lab 12/17/18 0638 12/17/18 1514 12/18/18 0454 12/19/18 0224 12/20/18 0430  WBC 7.0 9.6 8.4 9.3 10.2  NEUTROABS 4.7  --  5.4  --   --   HGB 8.1* 9.1* 8.9* 9.2* 10.6*  HCT 24.2* 26.4* 26.9* 27.3* 31.3*  MCV 97.6 96.4 96.4 97.2 99.4  PLT 81* 82* 79* 76* 92*   Basic Metabolic Panel: Recent Labs  Lab 12/16/18 0107 12/16/18 0429 12/17/18 0638 12/18/18 0454 12/19/18 0224 12/20/18 0430  NA  --  132* 137 136 136 137  K  --  2.8* 3.7 3.3* 3.9 4.0  CL  --  98 103 102 106 107  CO2  --  25 26 26 25 24   GLUCOSE  --  101* 98 100* 94 91  BUN  --  10 9 8  7* <5*    CREATININE  --  0.87 0.76 0.75 0.58 0.63  CALCIUM  --  7.9* 8.3* 7.9* 7.9* 8.1*  MG 1.9 1.8  --   --  1.6* 1.6*   GFR: Estimated Creatinine Clearance: 71.3 mL/min (by C-G formula based on SCr of 0.63 mg/dL). Liver Function Tests: Recent Labs  Lab 12/16/18 0107 12/16/18 0429 12/17/18 2130 12/19/18 0224 12/20/18 0430  AST 107* 59* 40 36 38  ALT 35 35 22 18 19   ALKPHOS 134* 139* 100 88 88  BILITOT 3.1* 2.7* 3.0* 3.3* 3.6*  PROT 6.6 6.7 6.3* 5.7* 6.2*  ALBUMIN 1.9* 2.0* 2.8* 2.2* 2.2*   No results for input(s): LIPASE, AMYLASE in the last 168 hours. Recent Labs  Lab 12/16/18 0429  AMMONIA 39*   Coagulation Profile: Recent Labs  Lab 12/16/18 0107  INR 1.39   Cardiac Enzymes: Recent Labs  Lab 12/17/18 0907 12/18/18 0454  TROPONINI 0.20* 0.14*   BNP (last 3 results) No results for input(s): PROBNP in the last 8760 hours. HbA1C: No results for input(s): HGBA1C in the last 72 hours. CBG: No results for input(s): GLUCAP in the last 168 hours. Lipid Profile: No results for input(s): CHOL, HDL, LDLCALC, TRIG, CHOLHDL, LDLDIRECT in the last 72 hours. Thyroid Function Tests: No results for  input(s): TSH, T4TOTAL, FREET4, T3FREE, THYROIDAB in the last 72 hours. Anemia Panel: No results for input(s): VITAMINB12, FOLATE, FERRITIN, TIBC, IRON, RETICCTPCT in the last 72 hours. Sepsis Labs: No results for input(s): PROCALCITON, LATICACIDVEN in the last 168 hours.  Recent Results (from the past 240 hour(s))  Respiratory Panel by PCR     Status: Abnormal   Collection Time: 12/17/18 11:29 AM  Result Value Ref Range Status   Adenovirus NOT DETECTED NOT DETECTED Final   Coronavirus 229E NOT DETECTED NOT DETECTED Final   Coronavirus HKU1 NOT DETECTED NOT DETECTED Final   Coronavirus NL63 NOT DETECTED NOT DETECTED Final   Coronavirus OC43 NOT DETECTED NOT DETECTED Final   Metapneumovirus NOT DETECTED NOT DETECTED Final   Rhinovirus / Enterovirus DETECTED (A) NOT DETECTED Final    Influenza A NOT DETECTED NOT DETECTED Final   Influenza B NOT DETECTED NOT DETECTED Final   Parainfluenza Virus 1 NOT DETECTED NOT DETECTED Final   Parainfluenza Virus 2 NOT DETECTED NOT DETECTED Final   Parainfluenza Virus 3 NOT DETECTED NOT DETECTED Final   Parainfluenza Virus 4 NOT DETECTED NOT DETECTED Final   Respiratory Syncytial Virus NOT DETECTED NOT DETECTED Final   Bordetella pertussis NOT DETECTED NOT DETECTED Final   Chlamydophila pneumoniae NOT DETECTED NOT DETECTED Final   Mycoplasma pneumoniae NOT DETECTED NOT DETECTED Final    Comment: Performed at [facility], [address], [place], [place] [address]  Urine Culture     Status: Abnormal   Collection Time: 12/17/18 12:22 PM  Result Value Ref Range Status   Specimen Description URINE, CATHETERIZED  Final   Special Requests   Final    NONE Performed at [facility], [place] [address], [place], [place] [address]    Culture >=100,000 COLONIES/mL ENTEROCOCCUS FAECALIS (A)  Final   Report Status 12/19/2018 FINAL  Final   Organism ID, Bacteria ENTEROCOCCUS FAECALIS (A)  Final      Susceptibility   Enterococcus faecalis - MIC*    AMPICILLIN <=2 SENSITIVE Sensitive     LEVOFLOXACIN 1 SENSITIVE Sensitive     NITROFURANTOIN <=16 SENSITIVE Sensitive     VANCOMYCIN 1 SENSITIVE Sensitive     * >=100,000 COLONIES/mL ENTEROCOCCUS FAECALIS         Radiology Studies: No results found.      Scheduled Meds: . aspirin  81 mg Oral Daily  . furosemide  40 mg Intravenous BID  . ipratropium-albuterol  3 mL Nebulization TID  . magnesium oxide  400 mg Oral Daily  . magnesium oxide  800 mg Oral TID  . pantoprazole (PROTONIX) IV  40 mg Intravenous Q12H  . potassium chloride  40 mEq Oral Daily  . spironolactone  100 mg Oral Daily   Continuous Infusions: . ampicillin (OMNIPEN) IV 1 g (12/20/18 1136)     LOS: 3 days   Time spent=25 mins    Shanoah Asbill Arsenio Loader, MD [facility]  If 7PM-7AM, please  contact night-coverage [email or web address] 12/20/2018, 12:15 PM

## 2018-12-20 NOTE — Progress Notes (Signed)
Physical Therapy Treatment Patient Details Name: Darlene Delacruz MRN: [id number] DOB: 07/21/1946 Today's Date: 12/20/2018    History of Present Illness Pt is a 73 y.o. female admitted 12/15/18 after recent d/c from SNF (12/14/18) with worsening SOB and edema. Abdominal CT showed cirrhosis with ascites. Worked up for anasarca most likely due to alcoholic liver cirrhosis. PMH includes HTN, alcohol abuse, arthritis.    PT Comments    Patient seen for mobility progression. Pt tolerated short distances of gait (26 ft, 30 ft, then 50 ft) with seated rest breaks required due to SOB. SpO2 87% on RA at rest, RN notified, and pt placed on 2L O2 via [place]. SpO2 92% on 2L O2 while ambulating. Continue to progress as tolerated.     Follow Up Recommendations  Home health PT;Supervision/Assistance - 24 hour     Equipment Recommendations  Rolling walker with 5" wheels;3in1 (PT)    Recommendations for Other Services (Will consider OT consult for ADLs)     Precautions / Restrictions Precautions Precautions: Fall Restrictions Weight Bearing Restrictions: No    Mobility  Bed Mobility Overal bed mobility: Needs Assistance Bed Mobility: Supine to Sit     Supine to sit: Min assist     General bed mobility comments: assist to elevate trunk into sitting; cues for sequencing  Transfers Overall transfer level: Needs assistance Equipment used: Rolling walker (2 wheeled) Transfers: Sit to/from Stand Sit to Stand: Min assist;Min guard         General transfer comment: min guard assist to stand from chair with armrests and min A  from EOB; cues for safe hand placement  Ambulation/Gait Ambulation/Gait assistance: Min guard;Min assist Gait Distance (Feet): (26 ft, 30 ft, then 50 ft) Assistive device: Rolling walker (2 wheeled) Gait Pattern/deviations: Step-through pattern;Decreased stride length Gait velocity: decreased   General Gait Details: cues for breathing technique; seated rest breaks required  due to SOB    Stairs             Wheelchair Mobility    Modified Rankin (Stroke Patients Only)       Balance Overall balance assessment: Mild deficits observed, not formally tested                                          Cognition Arousal/Alertness: Awake/alert Behavior During Therapy: WFL for tasks assessed/performed Overall Cognitive Status: Within Functional Limits for tasks assessed                                        Exercises      General Comments General comments (skin integrity, edema, etc.): SpO2 87% on RA and 92 % with 2L O2 via [place]      Pertinent Vitals/Pain Pain Assessment: Faces Faces Pain Scale: Hurts little more Pain Location: abdominal pain when coughing Pain Descriptors / Indicators: Grimacing;Guarding;Sore Pain Intervention(s): Monitored during session    Home Living                      Prior Function            PT Goals (current goals can now be found in the care plan section) Progress towards PT goals: Progressing toward goals    Frequency    Min 3X/week  PT Plan Current plan remains appropriate    Co-evaluation              AM-PAC PT "6 Clicks" Mobility   Outcome Measure  Help needed turning from your back to your side while in a flat bed without using bedrails?: None Help needed moving from lying on your back to sitting on the side of a flat bed without using bedrails?: A Little Help needed moving to and from a bed to a chair (including a wheelchair)?: A Little Help needed standing up from a chair using your arms (e.g., wheelchair or bedside chair)?: A Little Help needed to walk in hospital room?: A Little Help needed climbing 3-5 steps with a railing? : A Little 6 Click Score: 19    End of Session Equipment Utilized During Treatment: Gait belt Activity Tolerance: Patient tolerated treatment well Patient left: in chair;with call bell/phone within reach Nurse  Communication: Mobility status PT Visit Diagnosis: Other abnormalities of gait and mobility (R26.89);Muscle weakness (generalized) (M62.81)     Time: 1000-1040 PT Time Calculation (min) (ACUTE ONLY): 40 min  Charges:  $Gait Training: 23-37 mins                     Earney Navy, PTA Acute Rehabilitation Services Pager: [phone] Office: [phone]     [name] 12/20/2018, 11:09 AM

## 2018-12-21 LAB — COMPREHENSIVE METABOLIC PANEL
ALT: 20 U/L (ref 0–44)
AST: 41 U/L (ref 15–41)
Albumin: 2 g/dL — ABNORMAL LOW (ref 3.5–5.0)
Alkaline Phosphatase: 91 U/L (ref 38–126)
Anion gap: 6 (ref 5–15)
BUN: <5 mg/dL — ABNORMAL LOW (ref 8–23)
CO2: 27 mmol/L (ref 22–32)
Calcium: 8 mg/dL — ABNORMAL LOW (ref 8.9–10.3)
Chloride: 104 mmol/L (ref 98–111)
Creatinine, Ser: 0.54 mg/dL (ref 0.44–1.00)
GFR calc Af Amer: >60 mL/min (ref >60)
GFR calc non Af Amer: >60 mL/min (ref >60)
Glucose, Bld: 92 mg/dL (ref 70–99)
Potassium: 3.6 mmol/L (ref 3.5–5.1)
Sodium: 137 mmol/L (ref 135–145)
Total Bilirubin: 2.8 mg/dL — ABNORMAL HIGH (ref 0.3–1.2)
Total Protein: 5.9 g/dL — ABNORMAL LOW (ref 6.5–8.1)

## 2018-12-21 LAB — MAGNESIUM: Magnesium: 1.5 mg/dL — ABNORMAL LOW (ref 1.7–2.4)

## 2018-12-21 LAB — GLUCOSE, CAPILLARY: Glucose-Capillary: 100 mg/dL — ABNORMAL HIGH (ref 70–99)

## 2018-12-21 LAB — CBC
HCT: 28.4 % — ABNORMAL LOW (ref 36.0–46.0)
Hemoglobin: 9.7 g/dL — ABNORMAL LOW (ref 12.0–15.0)
MCH: 33.4 pg (ref 26.0–34.0)
MCHC: 34.2 g/dL (ref 30.0–36.0)
MCV: 97.9 fL (ref 80.0–100.0)
Platelets: 88 10*3/uL — ABNORMAL LOW (ref 150–400)
RBC: 2.9 MIL/uL — ABNORMAL LOW (ref 3.87–5.11)
RDW: 17.7 % — ABNORMAL HIGH (ref 11.5–15.5)
WBC: 7 10*3/uL (ref 4.0–10.5)
nRBC: 0 % (ref 0.0–0.2)

## 2018-12-21 MED ORDER — MAGNESIUM SULFATE 2 GM/50ML IV SOLN
2.0000 g | Freq: Once | INTRAVENOUS | Status: AC
  Administered 2018-12-21: 2 g via INTRAVENOUS
  Filled 2018-12-21: qty 50

## 2018-12-21 MED ORDER — PREDNISONE 20 MG PO TABS
40.0000 mg | ORAL_TABLET | Freq: Every day | ORAL | Status: DC
  Administered 2018-12-21 – 2018-12-22 (×2): 40 mg via ORAL
  Filled 2018-12-21 (×2): qty 2

## 2018-12-21 NOTE — Progress Notes (Signed)
Physical Therapy Treatment Patient Details Name: Darlene Delacruz MRN: [id number] DOB: 1946-10-10 Today's Date: 12/21/2018    History of Present Illness Pt is a 73 y.o. female admitted 12/15/18 after recent d/c from SNF (12/14/18) with worsening SOB and edema. Abdominal CT showed cirrhosis with ascites. Worked up for anasarca most likely due to alcoholic liver cirrhosis. PMH includes HTN, alcohol abuse, arthritis.    PT Comments    Pt performed gait training and functional mobility during session this am requiring min to min guard assistance for safety.  Pt progressing well and remains largely limited by O2 dependency and decreased endurance/fucntional capacity.  Pt on 2L [address] and maintained 88%-96%.  Plan for HHPT remains appropriate based on progression at this time.    Follow Up Recommendations  Home health PT;Supervision/Assistance - 24 hour     Equipment Recommendations  Rolling walker with 5" wheels;3in1 (PT)    Recommendations for Other Services       Precautions / Restrictions Precautions Precautions: Fall Precaution Comments: Fall risk greatly reduced withuse of RW Restrictions Weight Bearing Restrictions: No    Mobility  Bed Mobility Overal bed mobility: Needs Assistance Bed Mobility: Supine to Sit     Supine to sit: Min assist     General bed mobility comments: assist to elevate trunk into sitting; cues for sequencing  Transfers Overall transfer level: Needs assistance Equipment used: Rolling walker (2 wheeled) Transfers: Sit to/from Stand Sit to Stand: Min assist;Min guard         General transfer comment: Cues for hand placement to and from seated surface.  Min assistance to boost into standing from bed and min guard from chair with armrests.    Ambulation/Gait Ambulation/Gait assistance: Min guard;Min assist Gait Distance (Feet): 40 Feet(x2 trials.  Required seated rest break midway.  ) Assistive device: Rolling walker (2 wheeled) Gait Pattern/deviations:  Step-through pattern;Decreased stride length Gait velocity: decreased   General Gait Details: cues for breathing technique; seated rest breaks required due to SOB    Stairs             Wheelchair Mobility    Modified Rankin (Stroke Patients Only)       Balance Overall balance assessment: Mild deficits observed, not formally tested                                          Cognition Arousal/Alertness: Awake/alert Behavior During Therapy: WFL for tasks assessed/performed Overall Cognitive Status: Within Functional Limits for tasks assessed                                        Exercises      General Comments        Pertinent Vitals/Pain Pain Assessment: No/denies pain    Home Living                      Prior Function            PT Goals (current goals can now be found in the care plan section) Acute Rehab PT Goals Patient Stated Goal: back to being independent Potential to Achieve Goals: Good Progress towards PT goals: Progressing toward goals    Frequency    Min 3X/week      PT Plan Current plan remains appropriate  Co-evaluation              AM-PAC PT "6 Clicks" Mobility   Outcome Measure  Help needed turning from your back to your side while in a flat bed without using bedrails?: None Help needed moving from lying on your back to sitting on the side of a flat bed without using bedrails?: A Little Help needed moving to and from a bed to a chair (including a wheelchair)?: A Little Help needed standing up from a chair using your arms (e.g., wheelchair or bedside chair)?: A Little Help needed to walk in hospital room?: A Little Help needed climbing 3-5 steps with a railing? : A Little 6 Click Score: 19    End of Session Equipment Utilized During Treatment: Gait belt Activity Tolerance: Patient tolerated treatment well Patient left: in chair;with call bell/phone within reach Nurse  Communication: Mobility status PT Visit Diagnosis: Other abnormalities of gait and mobility (R26.89);Muscle weakness (generalized) (M62.81)     Time: 3299-2426 PT Time Calculation (min) (ACUTE ONLY): 24 min  Charges:  $Gait Training: 8-22 mins $Therapeutic Activity: 8-22 mins                     Governor Rooks, PTA Acute Rehabilitation Services Pager [phone] Office [phone]     Kamaree Wheatley Eli Hose 12/21/2018, 3:47 PM

## 2018-12-21 NOTE — Progress Notes (Signed)
Patient ID: Darlene Delacruz, female   DOB: [birth year], 73 y.o.   MRN: [id number]  PROGRESS NOTE    Darlene Delacruz  EPP:[id number] DOB: March 14, 1946 DOA: 12/15/2018 PCP: Janie Morning, DO   Brief Narrative:  73 year old female with history of alcoholic cirrhosis, thrombocytopenia, essential hypertension, hyperlipidemia presented for evaluation of lower extremity swelling, dyspnea and anasarca.Patient was started on diuretics and gastroenterology was consulted.   Assessment & Plan:   Principal Problem:   Anasarca Active Problems:   HLD (hyperlipidemia)   Essential hypertension   Liver cirrhosis (HCC)   Alcohol abuse   Hypokalemia   Hypomagnesemia   Thrombocytopenia (HCC)   Normocytic anemia   Abnormal LFTs   Alcoholism ([place])   Decompensated alcoholic liver cirrhosis with ascites and generalized anasarca -GI following.  Continue Lasix and spironolactone.  Strict input and output.  Daily weights.  Fluid restriction.  Dietary compliance.  There is not enough fluids for paracentesis.  GI bleeding/acute blood loss anemia -Hemoglobin was 9.7.  Continue Protonix twice a day.  GI following: Mild to endoscopy as an outpatient.  Avoid NSAIDs.  Monitor H&H.  Viral upper respiratory tract infection with hypoxia -Respite panel is positive for rhinovirus.  Currently wheezing.  Will start oral prednisone.  Continue bronchodilators, incentive spirometry and flutter valve.  Wean off oxygen as able, if not patient might need home oxygen at discharge.  E faecalis urinary tract infection -Continue Augmentin.  Hyperlipidemia -Resume statin on discharge as LFTs have improved  Essential hypertension -Monitor blood pressure.  Continue Lasix and spironolactone   severe protein calorie malnutrition -Follow nutrition recommendations  Generalized deconditioning -PT eval.  Might need home health  DVT prophylaxis: SCDs Code Status: Partial Family Communication: None at bedside Disposition Plan: Home in  1 to 2 days if clinically improved  Consultants: GI  Procedures: None  Antimicrobials:  Rocephin 12/17/2018-12/19/2018 Ampicillin 12/19/2018-12/20/2018 Augmentin 12/20/2018   Subjective: Patient seen and examined at bedside.  She denies any worsening shortness of breath, fever, melena or hematochezia.  Objective: Vitals:   12/20/18 2159 12/21/18 0519 12/21/18 0851 12/21/18 0853  BP:  (!) 121/57  132/67  Pulse:  93 84 83  Resp:  12 18   Temp:  98.6 F (37 C)    TempSrc:      SpO2: 90% 98% 95% 94%  Weight:  91.2 kg    Height:        Intake/Output Summary (Last 24 hours) at 12/21/2018 1356 Last data filed at 12/21/2018 1211 Gross per 24 hour  Intake -  Output 5130 ml  Net -5130 ml   Filed Weights   12/15/18 2127 12/20/18 1022 12/21/18 0519  Weight: 88.6 kg 87 kg 91.2 kg    Examination:  General exam: Appears calm and comfortable.  No distress Respiratory system: Bilateral decreased breath sounds at bases, with scattered basilar crackles Cardiovascular system: S1 & S2 heard, Rate controlled Gastrointestinal system: Abdomen is distended, soft and nontender. Normal bowel sounds heard. Extremities: No cyanosis, clubbing; 2+ edema   Data Reviewed: I have personally reviewed following labs and imaging studies  CBC: Recent Labs  Lab 12/17/18 0638 12/17/18 1514 12/18/18 0454 12/19/18 0224 12/20/18 0430 12/21/18 0345  WBC 7.0 9.6 8.4 9.3 10.2 7.0  NEUTROABS 4.7  --  5.4  --   --   --   HGB 8.1* 9.1* 8.9* 9.2* 10.6* 9.7*  HCT 24.2* 26.4* 26.9* 27.3* 31.3* 28.4*  MCV 97.6 96.4 96.4 97.2 99.4 97.9  PLT 81* 82* 79* 76*  92* 88*   Basic Metabolic Panel: Recent Labs  Lab 12/16/18 0107 12/16/18 0429 12/17/18 0638 12/18/18 0454 12/19/18 0224 12/20/18 0430 12/21/18 0345  NA  --  132* 137 136 136 137 137  K  --  2.8* 3.7 3.3* 3.9 4.0 3.6  CL  --  98 103 102 106 107 104  CO2  --  25 26 26 25 24 27   GLUCOSE  --  101* 98 100* 94 91 92  BUN  --  10 9 8  7* <5* <5*    CREATININE  --  0.87 0.76 0.75 0.58 0.63 0.54  CALCIUM  --  7.9* 8.3* 7.9* 7.9* 8.1* 8.0*  MG 1.9 1.8  --   --  1.6* 1.6* 1.5*   GFR: Estimated Creatinine Clearance: 73.1 mL/min (by C-G formula based on SCr of 0.54 mg/dL). Liver Function Tests: Recent Labs  Lab 12/16/18 0429 12/17/18 0638 12/19/18 0224 12/20/18 0430 12/21/18 0345  AST 59* 40 36 38 41  ALT 35 22 18 19 20   ALKPHOS 139* 100 88 88 91  BILITOT 2.7* 3.0* 3.3* 3.6* 2.8*  PROT 6.7 6.3* 5.7* 6.2* 5.9*  ALBUMIN 2.0* 2.8* 2.2* 2.2* 2.0*   No results for input(s): LIPASE, AMYLASE in the last 168 hours. Recent Labs  Lab 12/16/18 0429  AMMONIA 39*   Coagulation Profile: Recent Labs  Lab 12/16/18 0107  INR 1.39   Cardiac Enzymes: Recent Labs  Lab 12/17/18 0907 12/18/18 0454  TROPONINI 0.20* 0.14*   BNP (last 3 results) No results for input(s): PROBNP in the last 8760 hours. HbA1C: No results for input(s): HGBA1C in the last 72 hours. CBG: Recent Labs  Lab 12/21/18 1143  GLUCAP 100*   Lipid Profile: No results for input(s): CHOL, HDL, LDLCALC, TRIG, CHOLHDL, LDLDIRECT in the last 72 hours. Thyroid Function Tests: No results for input(s): TSH, T4TOTAL, FREET4, T3FREE, THYROIDAB in the last 72 hours. Anemia Panel: No results for input(s): VITAMINB12, FOLATE, FERRITIN, TIBC, IRON, RETICCTPCT in the last 72 hours. Sepsis Labs: No results for input(s): PROCALCITON, LATICACIDVEN in the last 168 hours.  Recent Results (from the past 240 hour(s))  Respiratory Panel by PCR     Status: Abnormal   Collection Time: 12/17/18 11:29 AM  Result Value Ref Range Status   Adenovirus NOT DETECTED NOT DETECTED Final   Coronavirus 229E NOT DETECTED NOT DETECTED Final   Coronavirus HKU1 NOT DETECTED NOT DETECTED Final   Coronavirus NL63 NOT DETECTED NOT DETECTED Final   Coronavirus OC43 NOT DETECTED NOT DETECTED Final   Metapneumovirus NOT DETECTED NOT DETECTED Final   Rhinovirus / Enterovirus DETECTED (A) NOT  DETECTED Final   Influenza A NOT DETECTED NOT DETECTED Final   Influenza B NOT DETECTED NOT DETECTED Final   Parainfluenza Virus 1 NOT DETECTED NOT DETECTED Final   Parainfluenza Virus 2 NOT DETECTED NOT DETECTED Final   Parainfluenza Virus 3 NOT DETECTED NOT DETECTED Final   Parainfluenza Virus 4 NOT DETECTED NOT DETECTED Final   Respiratory Syncytial Virus NOT DETECTED NOT DETECTED Final   Bordetella pertussis NOT DETECTED NOT DETECTED Final   Chlamydophila pneumoniae NOT DETECTED NOT DETECTED Final   Mycoplasma pneumoniae NOT DETECTED NOT DETECTED Final    Comment: Performed at [facility], [address] [place],  [address]  Urine Culture     Status: Abnormal   Collection Time: 12/17/18 12:22 PM  Result Value Ref Range Status   Specimen Description URINE, CATHETERIZED  Final   Special Requests  Final    NONE Performed at [facility], [place] [address] [place], [place] [address]    Culture >=100,000 COLONIES/mL ENTEROCOCCUS FAECALIS (A)  Final   Report Status 12/19/2018 FINAL  Final   Organism ID, Bacteria ENTEROCOCCUS FAECALIS (A)  Final      Susceptibility   Enterococcus faecalis - MIC*    AMPICILLIN <=2 SENSITIVE Sensitive     LEVOFLOXACIN 1 SENSITIVE Sensitive     NITROFURANTOIN <=16 SENSITIVE Sensitive     VANCOMYCIN 1 SENSITIVE Sensitive     * >=100,000 COLONIES/mL ENTEROCOCCUS FAECALIS         Radiology Studies: No results found.      Scheduled Meds: . amoxicillin-clavulanate  1 tablet Oral Q12H  . aspirin  81 mg Oral Daily  . furosemide  80 mg Oral BID  . ipratropium-albuterol  3 mL Nebulization BID  . magnesium oxide  400 mg Oral Daily  . pantoprazole (PROTONIX) IV  40 mg Intravenous Q12H  . potassium chloride  40 mEq Oral Daily  . predniSONE  40 mg Oral Q breakfast  . spironolactone  100 mg Oral Daily   Continuous Infusions:   LOS: 4 days        Aline August, MD [facility] Pager [phone]  If 7PM-7AM,  please contact night-coverage [email or web address] Password [email or web address] 12/21/2018, 1:56 PM

## 2018-12-22 LAB — CBC
HCT: 29.9 % — ABNORMAL LOW (ref 36.0–46.0)
Hemoglobin: 10.2 g/dL — ABNORMAL LOW (ref 12.0–15.0)
MCH: 33 pg (ref 26.0–34.0)
MCHC: 34.1 g/dL (ref 30.0–36.0)
MCV: 96.8 fL (ref 80.0–100.0)
Platelets: 95 10*3/uL — ABNORMAL LOW (ref 150–400)
RBC: 3.09 MIL/uL — ABNORMAL LOW (ref 3.87–5.11)
RDW: 17.2 % — ABNORMAL HIGH (ref 11.5–15.5)
WBC: 5.1 10*3/uL (ref 4.0–10.5)
nRBC: 0 % (ref 0.0–0.2)

## 2018-12-22 LAB — COMPREHENSIVE METABOLIC PANEL
ALT: 20 U/L (ref 0–44)
AST: 32 U/L (ref 15–41)
Albumin: 1.9 g/dL — ABNORMAL LOW (ref 3.5–5.0)
Alkaline Phosphatase: 84 U/L (ref 38–126)
Anion gap: 6 (ref 5–15)
BUN: 5 mg/dL — ABNORMAL LOW (ref 8–23)
CO2: 29 mmol/L (ref 22–32)
Calcium: 8.3 mg/dL — ABNORMAL LOW (ref 8.9–10.3)
Chloride: 103 mmol/L (ref 98–111)
Creatinine, Ser: 0.73 mg/dL (ref 0.44–1.00)
GFR calc Af Amer: >60 mL/min (ref >60)
GFR calc non Af Amer: >60 mL/min (ref >60)
Glucose, Bld: 130 mg/dL — ABNORMAL HIGH (ref 70–99)
Potassium: 4 mmol/L (ref 3.5–5.1)
Sodium: 138 mmol/L (ref 135–145)
Total Bilirubin: 2.2 mg/dL — ABNORMAL HIGH (ref 0.3–1.2)
Total Protein: 6.2 g/dL — ABNORMAL LOW (ref 6.5–8.1)

## 2018-12-22 LAB — MAGNESIUM: Magnesium: 1.8 mg/dL (ref 1.7–2.4)

## 2018-12-22 MED ORDER — ONDANSETRON HCL 4 MG PO TABS: 4.0000 mg | ORAL_TABLET | Freq: Four times a day (QID) | ORAL | 0 refills | Status: DC | PRN

## 2018-12-22 MED ORDER — PREDNISONE 20 MG PO TABS: 40.0000 mg | ORAL_TABLET | Freq: Every day | ORAL | 0 refills | Status: AC

## 2018-12-22 MED ORDER — GUAIFENESIN-DM 100-10 MG/5ML PO SYRP: 5.0000 mL | ORAL_SOLUTION | ORAL | 0 refills | Status: DC | PRN

## 2018-12-22 MED ORDER — MAGNESIUM OXIDE 400 (241.3 MG) MG PO TABS: 400.0000 mg | ORAL_TABLET | Freq: Every day | ORAL | 0 refills | Status: DC

## 2018-12-22 MED ORDER — PANTOPRAZOLE SODIUM 40 MG PO TBEC: 40.0000 mg | DELAYED_RELEASE_TABLET | Freq: Two times a day (BID) | ORAL | 0 refills | Status: DC

## 2018-12-22 MED ORDER — SPIRONOLACTONE 100 MG PO TABS: 100.0000 mg | ORAL_TABLET | Freq: Every day | ORAL | 0 refills | Status: DC

## 2018-12-22 MED ORDER — POTASSIUM CHLORIDE CRYS ER 20 MEQ PO TBCR: 40.0000 meq | EXTENDED_RELEASE_TABLET | Freq: Every day | ORAL | 0 refills | Status: DC

## 2018-12-22 MED ORDER — FUROSEMIDE 80 MG PO TABS: 80.0000 mg | ORAL_TABLET | Freq: Two times a day (BID) | ORAL | 0 refills | Status: DC

## 2018-12-22 NOTE — Discharge Summary (Signed)
Physician Discharge Summary  Darlene Delacruz WUJ:[id number] DOB: 19-Oct-1946 DOA: 12/15/2018  PCP: Janie Morning, DO  Admit date: 12/15/2018 Discharge date: 12/22/2018  Admitted From: Home Disposition:  Home  Recommendations for Outpatient Follow-up:  1. Follow up with PCP in 1 week with repeat CBC/BMP 2. Follow up with GI/Dr. Collene Mares as an outpatient 3. Follow up in the ED if symptoms worsen or new appear   Home Health: Yes  Equipment/Devices: Oxygen via [place] at 2L/min  Discharge Condition: Stable CODE STATUS: Partial  Diet recommendation: Heart Healthy /Fluid restriction upto 1541ml per day  Brief/Interim Summary: 73 year old female with history of alcoholic cirrhosis, thrombocytopenia, essential hypertension, hyperlipidemia presented for evaluation of lower extremity swelling, dyspnea and anasarca.Patient was started on diuretics and gastroenterology was consulted. She was diuresed with lasix and spironolactone. Hemoglobin has remained stable, GI recommends outpatient followup with probable endoscopy. She was found to have rhinovirus URI; started on oral prednisone. She will be discharged home on oxygen via nasal cannula.   Discharge Diagnoses:  Principal Problem:   Anasarca Active Problems:   HLD (hyperlipidemia)   Essential hypertension   Liver cirrhosis (HCC)   Alcohol abuse   Hypokalemia   Hypomagnesemia   Thrombocytopenia (HCC)   Normocytic anemia   Abnormal LFTs   Alcoholism ([place])  Decompensated alcoholic liver cirrhosis with ascites and generalized anasarca -GI following.  Continue Lasix and spironolactone.  Negative balance of 7846.7 cc since admission - Continue fluid restriction.  Dietary compliance.  There was not enough fluids for paracentesis. -Continue current doses of lasix and spironolactone on discharge. Close outpatient followup with PCP and GI. Monitor CBC/BMP as an outpatient.  GI bleeding/acute blood loss anemia -Hemoglobin is 10.2 today.  Continue Protonix  twice a day.  GI following: Might need endoscopy as an outpatient.  Avoid NSAIDs.  Outpatient followup. No current overt GI bleed.  Viral upper respiratory tract infection with hypoxia -Respiratory panel is positive for rhinovirus.  Started on Prednisone for wheezing. Discharge on oral prednisone 40mg  daily for 5 days.   -Patient still requires oxygen via nasal cannula 2L/min for hypoxia as oxygen saturation drops to the 80's on ambulation  E faecalis urinary tract infection -treated with antibiotics. No need for antibiotics on discharge  Hyperlipidemia -Resume statin on discharge as LFTs have improved  Essential hypertension -Monitor blood pressure.  Continue Lasix and spironolactone   severe protein calorie malnutrition -Follow nutrition recommendations  Generalized deconditioning -discharge home with home health  Discharge Instructions  Discharge Instructions    Call MD for:  difficulty breathing, headache or visual disturbances   Complete by:  As directed    Call MD for:  extreme fatigue   Complete by:  As directed    Call MD for:  persistant nausea and vomiting   Complete by:  As directed    Call MD for:  severe uncontrolled pain   Complete by:  As directed    Call MD for:  temperature >100.4   Complete by:  As directed    Diet - low sodium heart healthy   Complete by:  As directed    For home use only DME oxygen   Complete by:  As directed    Mode or (Route):  Nasal cannula   Liters per Minute:  2   Frequency:  Continuous (stationary and portable oxygen unit needed)   Oxygen conserving device:  Yes   Oxygen delivery system:  Gas   Increase activity slowly   Complete by:  As directed  Allergies as of 12/22/2018   No Known Allergies     Medication List    STOP taking these medications   amLODipine 5 MG tablet Commonly known as:  NORVASC   Magnesium 400 MG Caps Replaced by:  magnesium oxide 400 (241.3 Mg) MG tablet     TAKE these medications    aspirin 81 MG tablet Take 81 mg by mouth daily.   furosemide 80 MG tablet Commonly known as:  LASIX Take 1 tablet (80 mg total) by mouth 2 (two) times daily. What changed:    how much to take  when to take this  additional instructions   guaiFENesin-dextromethorphan 100-10 MG/5ML syrup Commonly known as:  ROBITUSSIN DM Take 5 mLs by mouth every 4 (four) hours as needed for cough.   magnesium oxide 400 (241.3 Mg) MG tablet Commonly known as:  MAG-OX Take 1 tablet (400 mg total) by mouth daily. Replaces:  Magnesium 400 MG Caps   ondansetron 4 MG tablet Commonly known as:  ZOFRAN Take 1 tablet (4 mg total) by mouth every 6 (six) hours as needed for nausea.   pantoprazole 40 MG tablet Commonly known as:  PROTONIX Take 1 tablet (40 mg total) by mouth 2 (two) times daily before a meal for 30 days.   potassium chloride SA 20 MEQ tablet Commonly known as:  K-DUR,KLOR-CON Take 2 tablets (40 mEq total) by mouth daily.   pravastatin 20 MG tablet Commonly known as:  PRAVACHOL Take 1 tablet (20 mg total) by mouth at bedtime.   predniSONE 20 MG tablet Commonly known as:  DELTASONE Take 2 tablets (40 mg total) by mouth daily with breakfast for 5 days. Start taking on:  December 23, 2018   spironolactone 100 MG tablet Commonly known as:  ALDACTONE Take 1 tablet (100 mg total) by mouth daily.            Durable Medical Equipment  (From admission, onward)         Start     Ordered   12/22/18 0000  For home use only DME oxygen    Question Answer Comment  Mode or (Route) Nasal cannula   Liters per Minute 2   Frequency Continuous (stationary and portable oxygen unit needed)   Oxygen conserving device Yes   Oxygen delivery system Gas      12/22/18 1033         Follow-up Information    Janie Morning, DO. Schedule an appointment as soon as possible for a visit in 1 week(s).   Specialty:  Family Medicine Why:  with cbc/CMP Contact information: [address] [place] [address] [phone]        Juanita Craver, MD. Schedule an appointment as soon as possible for a visit in 1 week(s).   Specialty:  Gastroenterology Contact information: [address], [place] [address] [phone]          No Known Allergies  Consultations:  GI   Procedures/Studies: Dg Chest 2 View  Result Date: 12/15/2018 CLINICAL DATA:  Shortness of breath and fluid retention for 1 day. Recent hospitalization for sepsis and pneumonia. Former smoker. EXAM: CHEST - 2 VIEW COMPARISON:  01/09/2018 FINDINGS: Mild cardiac enlargement. No vascular congestion. Mild diffuse interstitial prominence to the lungs, likely representing early edema. Interstitial pneumonitis also possible. No blunting of costophrenic angles. No pneumothorax. No focal consolidation. Calcification of the aorta. Degenerative changes in the spine. IMPRESSION: Mild cardiac enlargement. Mild diffuse interstitial pattern to  the lungs may represent early edema or interstitial pneumonitis. Electronically Signed   By: Lucienne Capers M.D.   On: 12/15/2018 22:34   Ct Abdomen Pelvis W Contrast  Result Date: 12/16/2018 CLINICAL DATA:  73 y/o  F; weight gain and shortness of breath. EXAM: CT ABDOMEN AND PELVIS WITH CONTRAST TECHNIQUE: Multidetector CT imaging of the abdomen and pelvis was performed using the standard protocol following bolus administration of intravenous contrast. CONTRAST:  149mL OMNIPAQUE IOHEXOL 300 MG/ML  SOLN COMPARISON:  09/16/2017 abdominal ultrasound. FINDINGS: Lower chest: Mild interlobular septal thickening and ground-glass opacities in the lung bases compatible with interstitial pulmonary edema. Cardiomegaly. Hepatobiliary: Mild enlargement of the left lobe of the liver suggestion of a lobulated contour may reflect underlying cirrhosis. No focal liver lesion. Cholelithiasis. No intra or extrahepatic biliary ductal dilatation. Recanalized umbilical vein  and prominent omental vessels, probable portal caval collaterals. Pancreas: Unremarkable. No pancreatic ductal dilatation or surrounding inflammatory changes. Spleen: Normal in size without focal abnormality. Adrenals/Urinary Tract: Adrenal glands are unremarkable. Kidneys are normal, without renal calculi, focal lesion, or hydronephrosis. Bladder is unremarkable. Stomach/Bowel: Stomach is within normal limits. Appendix appears normal. No evidence of bowel wall thickening, distention, or inflammatory changes. Vascular/Lymphatic: Aortic atherosclerosis. No enlarged abdominal or pelvic lymph nodes. Reproductive: Status post hysterectomy. No adnexal masses. Other: Moderate volume of ascites. Diffuse subcutaneous edema. Musculoskeletal: No fracture is seen. L4-5 grade 1 anterolisthesis due to prominent facet arthropathy. IMPRESSION: 1. Findings compatible with cirrhosis including recanalized umbilical vein and omental collaterals. 2. Moderate volume of ascites.  Anasarca. 3. Mild interstitial pulmonary edema. Partially visualized cardiomegaly. 4.  Aortic Atherosclerosis (ICD10-I70.0). Electronically Signed   By: Kristine Garbe M.D.   On: 12/16/2018 02:52   US Abdomen Limited  Result Date: 12/18/2018 CLINICAL DATA:  73 year old female with cirrhosis and ascites. EXAM: LIMITED ABDOMEN ULTRASOUND FOR ASCITES TECHNIQUE: Limited ultrasound survey for ascites was performed in all four abdominal quadrants. COMPARISON:  None. FINDINGS: Sonographic interrogation of the 4 quadrants of the abdomen demonstrates small volume ascites. There was borderline sufficient fluid for paracentesis. The patient deferred intervention at this time. IMPRESSION: Relatively mild ascites.  Paracentesis was deferred. Electronically Signed   By: Jacqulynn Cadet M.D.   On: 12/18/2018 11:48   US Abdomen Limited Ruq  Result Date: 12/16/2018 CLINICAL DATA:  Attic cirrhosis EXAM: ULTRASOUND ABDOMEN LIMITED RIGHT UPPER QUADRANT  COMPARISON:  CT from earlier today FINDINGS: Gallbladder: Layering sludge. The gallbladder wall is thickened to 4 mm, likely reactive to the patient's cirrhosis. A small shadowing stone is seen towards the fundus. Common bile duct: Diameter: 5 mm.  Where visualized, no filling defect. Liver: Cirrhotic liver with patent and antegrade flow in the main portal vein. Moderate ascites with simple appearance. No focal liver finding. IMPRESSION: 1. Gallbladder sludge and small stone. There is gallbladder wall thickening that is likely reactive to the chronic liver disease as there is no sonographic Murphy sign. 2. Cirrhosis with moderate ascites. There is antegrade flow in the main portal vein. Electronically Signed   By: Monte Fantasia M.D.   On: 12/16/2018 06:46     Subjective: Patient seen and examined at bedside. She feels better and thinks she might be able to go home today. Still coughing and intermittently short of breath. No fevers.  Discharge Exam: Vitals:   12/22/18 0457 12/22/18 0826  BP: 130/60   Pulse: 79   Resp: 14   Temp: 97.7 F (36.5 C)   SpO2: 100% 92%   Vitals:  12/21/18 2107 12/22/18 0457 12/22/18 0500 12/22/18 0826  BP: 108/75 130/60    Pulse: 81 79    Resp: 16 14    Temp: 98.7 F (37.1 C) 97.7 F (36.5 C)    TempSrc: Oral Oral    SpO2: 97% 100%  92%  Weight:   91.1 kg   Height:        General: Pt is alert, awake, not in acute distress Cardiovascular: rate controlled, S1/S2 + Respiratory: bilateral decreased breath sounds at bases with scattered crackles; some wheezing Abdominal: Soft, NT, ND, bowel sounds + Extremities: 1-2+ edema, no cyanosis    The results of significant diagnostics from this hospitalization (including imaging, microbiology, ancillary and laboratory) are listed below for reference.     Microbiology: Recent Results (from the past 240 hour(s))  Respiratory Panel by PCR     Status: Abnormal   Collection Time: 12/17/18 11:29 AM  Result  Value Ref Range Status   Adenovirus NOT DETECTED NOT DETECTED Final   Coronavirus 229E NOT DETECTED NOT DETECTED Final   Coronavirus HKU1 NOT DETECTED NOT DETECTED Final   Coronavirus NL63 NOT DETECTED NOT DETECTED Final   Coronavirus OC43 NOT DETECTED NOT DETECTED Final   Metapneumovirus NOT DETECTED NOT DETECTED Final   Rhinovirus / Enterovirus DETECTED (A) NOT DETECTED Final   Influenza A NOT DETECTED NOT DETECTED Final   Influenza B NOT DETECTED NOT DETECTED Final   Parainfluenza Virus 1 NOT DETECTED NOT DETECTED Final   Parainfluenza Virus 2 NOT DETECTED NOT DETECTED Final   Parainfluenza Virus 3 NOT DETECTED NOT DETECTED Final   Parainfluenza Virus 4 NOT DETECTED NOT DETECTED Final   Respiratory Syncytial Virus NOT DETECTED NOT DETECTED Final   Bordetella pertussis NOT DETECTED NOT DETECTED Final   Chlamydophila pneumoniae NOT DETECTED NOT DETECTED Final   Mycoplasma pneumoniae NOT DETECTED NOT DETECTED Final    Comment: Performed at [facility], [address] [place], [place] [address]  Urine Culture     Status: Abnormal   Collection Time: 12/17/18 12:22 PM  Result Value Ref Range Status   Specimen Description URINE, CATHETERIZED  Final   Special Requests   Final    NONE Performed at [facility], [place] [address], [place],  [address]    Culture >=100,000 COLONIES/mL ENTEROCOCCUS FAECALIS (A)  Final   Report Status 12/19/2018 FINAL  Final   Organism ID, Bacteria ENTEROCOCCUS FAECALIS (A)  Final      Susceptibility   Enterococcus faecalis - MIC*    AMPICILLIN <=2 SENSITIVE Sensitive     LEVOFLOXACIN 1 SENSITIVE Sensitive     NITROFURANTOIN <=16 SENSITIVE Sensitive     VANCOMYCIN 1 SENSITIVE Sensitive     * >=100,000 COLONIES/mL ENTEROCOCCUS FAECALIS     Labs: BNP (last 3 results) Recent Labs    12/16/18 0025  BNP 56.2   Basic Metabolic Panel: Recent Labs  Lab 12/16/18 0429  12/18/18 0454 12/19/18 0224 12/20/18 0430 12/21/18 0345  12/22/18 0500  NA 132*   < > 136 136 137 137 138  K 2.8*   < > 3.3* 3.9 4.0 3.6 4.0  CL 98   < > 102 106 107 104 103  CO2 25   < > 26 25 24 27 29   GLUCOSE 101*   < > 100* 94 91 92 130*  BUN 10   < > 8 7* <5* <5* 5*  CREATININE 0.87   < > 0.75 0.58 0.63 0.54 0.73  CALCIUM  7.9*   < > 7.9* 7.9* 8.1* 8.0* 8.3*  MG 1.8  --   --  1.6* 1.6* 1.5* 1.8   < > = values in this interval not displayed.   Liver Function Tests: Recent Labs  Lab 12/17/18 0638 12/19/18 0224 12/20/18 0430 12/21/18 0345 12/22/18 0500  AST 40 36 38 41 32  ALT 22 18 19 20 20   ALKPHOS 100 88 88 91 84  BILITOT 3.0* 3.3* 3.6* 2.8* 2.2*  PROT 6.3* 5.7* 6.2* 5.9* 6.2*  ALBUMIN 2.8* 2.2* 2.2* 2.0* 1.9*   No results for input(s): LIPASE, AMYLASE in the last 168 hours. Recent Labs  Lab 12/16/18 0429  AMMONIA 39*   CBC: Recent Labs  Lab 12/17/18 0638  12/18/18 0454 12/19/18 0224 12/20/18 0430 12/21/18 0345 12/22/18 0500  WBC 7.0   < > 8.4 9.3 10.2 7.0 5.1  NEUTROABS 4.7  --  5.4  --   --   --   --   HGB 8.1*   < > 8.9* 9.2* 10.6* 9.7* 10.2*  HCT 24.2*   < > 26.9* 27.3* 31.3* 28.4* 29.9*  MCV 97.6   < > 96.4 97.2 99.4 97.9 96.8  PLT 81*   < > 79* 76* 92* 88* 95*   < > = values in this interval not displayed.   Cardiac Enzymes: Recent Labs  Lab 12/17/18 0907 12/18/18 0454  TROPONINI 0.20* 0.14*   BNP: Invalid input(s): POCBNP CBG: Recent Labs  Lab 12/21/18 1143  GLUCAP 100*   D-Dimer No results for input(s): DDIMER in the last 72 hours. Hgb A1c No results for input(s): HGBA1C in the last 72 hours. Lipid Profile No results for input(s): CHOL, HDL, LDLCALC, TRIG, CHOLHDL, LDLDIRECT in the last 72 hours. Thyroid function studies No results for input(s): TSH, T4TOTAL, T3FREE, THYROIDAB in the last 72 hours.  Invalid input(s): FREET3 Anemia work up No results for input(s): VITAMINB12, FOLATE, FERRITIN, TIBC, IRON, RETICCTPCT in the last 72 hours. Urinalysis    Component Value Date/Time    COLORURINE YELLOW 12/16/2018 0521   APPEARANCEUR CLEAR 12/16/2018 0521   LABSPEC 1.017 12/16/2018 0521   PHURINE 6.0 12/16/2018 0521   GLUCOSEU NEGATIVE 12/16/2018 0521   HGBUR LARGE (A) 12/16/2018 0521   BILIRUBINUR NEGATIVE 12/16/2018 0521   BILIRUBINUR 2 03/06/2011 1505   KETONESUR NEGATIVE 12/16/2018 0521   PROTEINUR NEGATIVE 12/16/2018 0521   UROBILINOGEN neg 03/06/2011 1505   NITRITE NEGATIVE 12/16/2018 0521   LEUKOCYTESUR TRACE (A) 12/16/2018 0521   Sepsis Labs Invalid input(s): PROCALCITONIN,  WBC,  LACTICIDVEN Microbiology Recent Results (from the past 240 hour(s))  Respiratory Panel by PCR     Status: Abnormal   Collection Time: 12/17/18 11:29 AM  Result Value Ref Range Status   Adenovirus NOT DETECTED NOT DETECTED Final   Coronavirus 229E NOT DETECTED NOT DETECTED Final   Coronavirus HKU1 NOT DETECTED NOT DETECTED Final   Coronavirus NL63 NOT DETECTED NOT DETECTED Final   Coronavirus OC43 NOT DETECTED NOT DETECTED Final   Metapneumovirus NOT DETECTED NOT DETECTED Final   Rhinovirus / Enterovirus DETECTED (A) NOT DETECTED Final   Influenza A NOT DETECTED NOT DETECTED Final   Influenza B NOT DETECTED NOT DETECTED Final   Parainfluenza Virus 1 NOT DETECTED NOT DETECTED Final   Parainfluenza Virus 2 NOT DETECTED NOT DETECTED Final   Parainfluenza Virus 3 NOT DETECTED NOT DETECTED Final   Parainfluenza Virus 4 NOT DETECTED NOT DETECTED Final   Respiratory Syncytial Virus NOT DETECTED NOT DETECTED Final  Bordetella pertussis NOT DETECTED NOT DETECTED Final   Chlamydophila pneumoniae NOT DETECTED NOT DETECTED Final   Mycoplasma pneumoniae NOT DETECTED NOT DETECTED Final    Comment: Performed at [facility], [place] [address], [place], [place] [address]  Urine Culture     Status: Abnormal   Collection Time: 12/17/18 12:22 PM  Result Value Ref Range Status   Specimen Description URINE, CATHETERIZED  Final   Special Requests   Final    NONE Performed at [facility], [place] [address] [place], [place] [address]    Culture >=100,000 COLONIES/mL ENTEROCOCCUS FAECALIS (A)  Final   Report Status 12/19/2018 FINAL  Final   Organism ID, Bacteria ENTEROCOCCUS FAECALIS (A)  Final      Susceptibility   Enterococcus faecalis - MIC*    AMPICILLIN <=2 SENSITIVE Sensitive     LEVOFLOXACIN 1 SENSITIVE Sensitive     NITROFURANTOIN <=16 SENSITIVE Sensitive     VANCOMYCIN 1 SENSITIVE Sensitive     * >=100,000 COLONIES/mL ENTEROCOCCUS FAECALIS     Time coordinating discharge: 35 minutes  SIGNED:   Aline August, MD  [facility] 12/22/2018, 10:33 AM Pager: [phone]  If 7PM-7AM, please contact night-coverage [email or web address] Password [email or web address]

## 2018-12-22 NOTE — Care Management Note (Signed)
Case Management Note  Patient Details  Name: PORCHA DEBLANC MRN: [id number] Date of Birth: Jan 11, 1946  Subjective/Objective:  Anasarca, LE swelling, SOB.Hx of alcoholic cirrhosis, thrombocytopenia, essential hypertension, hyperlipidemia. From home alone. PTA independent with ADL's. States has rolling walker and 3 in 1/ BSC .  Pt states has supportive daughter. Daughter lives on same street as pt.   Roseanne Reno (Daughter)     [phone]       Action/Plan: Pt will transition to home with home health services.  Per PT's recommendations: Home health PT;Supervision/Assistance - 24.  Pt agreeable to [facility] 1 st program...referral made per NCM.  Approval received for Home 1st program.  Portable oxygen will be delivered to bedside to assist with  transportation to home.   Daughter to provide transportation to home.  Expected Discharge Date:  12/22/18               Expected Discharge Plan:  [place]  In-House Referral:  NA  Discharge planning Services  CM Consult  Post Acute Care Choice:  NA Choice offered to:  Patient  DME Arranged:  Oxygen DME Agency:  [place] Arranged:  RN, OT, PT [facility] Agency:  [facility](Home Ist Program)  Status of Service:  Completed, signed off  If discussed at [place] of Stay Meetings, dates discussed:    Additional Comments:  Sharin Mons, RN 12/22/2018, 11:07 AM

## 2018-12-22 NOTE — Progress Notes (Signed)
SATURATION QUALIFICATIONS: (This note is used to comply with regulatory documentation for home oxygen)  Patient Saturations on Room Air at Rest = 97%  Patient Saturations on Room Air while Ambulating = 80%  Patient Saturations on 2 Liters of oxygen while Ambulating = 89%  Pt ambulated in hallway on RA and o2 sats decreased to 80%. Pt reported no SOB, but o2 sats recorded with pulsoximetry. Pt ambulated on 2L o2 via [place] and o2 sats decreased down to 89%.

## 2018-12-29 DIAGNOSIS — K5730 Diverticulosis of large intestine without perforation or abscess without bleeding: Secondary | ICD-10-CM | POA: Diagnosis not present

## 2018-12-29 DIAGNOSIS — K7030 Alcoholic cirrhosis of liver without ascites: Secondary | ICD-10-CM | POA: Diagnosis not present

## 2018-12-29 DIAGNOSIS — R748 Abnormal levels of other serum enzymes: Secondary | ICD-10-CM | POA: Diagnosis not present

## 2018-12-30 DIAGNOSIS — N390 Urinary tract infection, site not specified: Secondary | ICD-10-CM | POA: Diagnosis not present

## 2018-12-30 DIAGNOSIS — L0232 Furuncle of buttock: Secondary | ICD-10-CM | POA: Diagnosis not present

## 2018-12-30 DIAGNOSIS — B348 Other viral infections of unspecified site: Secondary | ICD-10-CM | POA: Diagnosis not present

## 2018-12-30 DIAGNOSIS — K7031 Alcoholic cirrhosis of liver with ascites: Secondary | ICD-10-CM | POA: Diagnosis not present

## 2018-12-31 DIAGNOSIS — K5900 Constipation, unspecified: Secondary | ICD-10-CM | POA: Diagnosis not present

## 2018-12-31 DIAGNOSIS — J189 Pneumonia, unspecified organism: Secondary | ICD-10-CM | POA: Diagnosis not present

## 2019-01-01 ENCOUNTER — Emergency Department (HOSPITAL_COMMUNITY): Payer: Medicare Other

## 2019-01-01 ENCOUNTER — Inpatient Hospital Stay (HOSPITAL_COMMUNITY)
Admission: EM | Admit: 2019-01-01 | Discharge: 2019-01-04 | DRG: 432 | Disposition: A | Discharge status: Skilled Nursing Facility | Payer: Medicare Other | Attending: Family Medicine | Admitting: Family Medicine

## 2019-01-01 ENCOUNTER — Encounter (HOSPITAL_COMMUNITY): Payer: Self-pay

## 2019-01-01 DIAGNOSIS — E785 Hyperlipidemia, unspecified: Secondary | ICD-10-CM | POA: Diagnosis present

## 2019-01-01 DIAGNOSIS — Z87891 Personal history of nicotine dependence: Secondary | ICD-10-CM | POA: Diagnosis not present

## 2019-01-01 DIAGNOSIS — G9340 Encephalopathy, unspecified: Secondary | ICD-10-CM | POA: Diagnosis present

## 2019-01-01 DIAGNOSIS — E7220 Disorder of urea cycle metabolism, unspecified: Secondary | ICD-10-CM | POA: Diagnosis present

## 2019-01-01 DIAGNOSIS — Z90710 Acquired absence of both cervix and uterus: Secondary | ICD-10-CM

## 2019-01-01 DIAGNOSIS — G9341 Metabolic encephalopathy: Secondary | ICD-10-CM | POA: Diagnosis not present

## 2019-01-01 DIAGNOSIS — Z7982 Long term (current) use of aspirin: Secondary | ICD-10-CM

## 2019-01-01 DIAGNOSIS — D696 Thrombocytopenia, unspecified: Secondary | ICD-10-CM | POA: Diagnosis present

## 2019-01-01 DIAGNOSIS — K7031 Alcoholic cirrhosis of liver with ascites: Secondary | ICD-10-CM | POA: Diagnosis present

## 2019-01-01 DIAGNOSIS — K7040 Alcoholic hepatic failure without coma: Secondary | ICD-10-CM | POA: Diagnosis present

## 2019-01-01 DIAGNOSIS — K219 Gastro-esophageal reflux disease without esophagitis: Secondary | ICD-10-CM | POA: Diagnosis present

## 2019-01-01 DIAGNOSIS — E860 Dehydration: Secondary | ICD-10-CM

## 2019-01-01 DIAGNOSIS — M8580 Other specified disorders of bone density and structure, unspecified site: Secondary | ICD-10-CM | POA: Diagnosis present

## 2019-01-01 DIAGNOSIS — Z79899 Other long term (current) drug therapy: Secondary | ICD-10-CM

## 2019-01-01 DIAGNOSIS — R5381 Other malaise: Secondary | ICD-10-CM | POA: Diagnosis present

## 2019-01-01 DIAGNOSIS — B952 Enterococcus as the cause of diseases classified elsewhere: Secondary | ICD-10-CM | POA: Diagnosis present

## 2019-01-01 DIAGNOSIS — R531 Weakness: Secondary | ICD-10-CM

## 2019-01-01 DIAGNOSIS — Z8249 Family history of ischemic heart disease and other diseases of the circulatory system: Secondary | ICD-10-CM

## 2019-01-01 DIAGNOSIS — I10 Essential (primary) hypertension: Secondary | ICD-10-CM | POA: Diagnosis not present

## 2019-01-01 DIAGNOSIS — K7460 Unspecified cirrhosis of liver: Secondary | ICD-10-CM | POA: Diagnosis present

## 2019-01-01 DIAGNOSIS — K224 Dyskinesia of esophagus: Secondary | ICD-10-CM | POA: Diagnosis present

## 2019-01-01 DIAGNOSIS — R7989 Other specified abnormal findings of blood chemistry: Secondary | ICD-10-CM | POA: Diagnosis present

## 2019-01-01 DIAGNOSIS — M1990 Unspecified osteoarthritis, unspecified site: Secondary | ICD-10-CM | POA: Diagnosis present

## 2019-01-01 DIAGNOSIS — D731 Hypersplenism: Secondary | ICD-10-CM | POA: Diagnosis present

## 2019-01-01 DIAGNOSIS — N179 Acute kidney failure, unspecified: Secondary | ICD-10-CM | POA: Diagnosis present

## 2019-01-01 LAB — COMPREHENSIVE METABOLIC PANEL
ALT: 40 U/L (ref 0–44)
AST: 40 U/L (ref 15–41)
Albumin: 3.3 g/dL — ABNORMAL LOW (ref 3.5–5.0)
Alkaline Phosphatase: 96 U/L (ref 38–126)
Anion gap: 13 (ref 5–15)
BUN: 31 mg/dL — ABNORMAL HIGH (ref 8–23)
CO2: 22 mmol/L (ref 22–32)
Calcium: 9.5 mg/dL (ref 8.9–10.3)
Chloride: 98 mmol/L (ref 98–111)
Creatinine, Ser: 1.41 mg/dL — ABNORMAL HIGH (ref 0.44–1.00)
GFR calc Af Amer: 43 mL/min — ABNORMAL LOW (ref >60)
GFR calc non Af Amer: 37 mL/min — ABNORMAL LOW (ref >60)
Glucose, Bld: 113 mg/dL — ABNORMAL HIGH (ref 70–99)
Potassium: 4.9 mmol/L (ref 3.5–5.1)
Sodium: 133 mmol/L — ABNORMAL LOW (ref 135–145)
Total Bilirubin: 3.8 mg/dL — ABNORMAL HIGH (ref 0.3–1.2)
Total Protein: 8 g/dL (ref 6.5–8.1)

## 2019-01-01 LAB — CBC WITH DIFFERENTIAL/PLATELET
Abs Immature Granulocytes: 0.03 10*3/uL (ref 0.00–0.07)
Basophils Absolute: 0 10*3/uL (ref 0.0–0.1)
Basophils Relative: 1 %
Eosinophils Absolute: 0.1 10*3/uL (ref 0.0–0.5)
Eosinophils Relative: 2 %
HCT: 37.6 % (ref 36.0–46.0)
Hemoglobin: 12.8 g/dL (ref 12.0–15.0)
Immature Granulocytes: 1 %
Lymphocytes Relative: 19 %
Lymphs Abs: 1.3 10*3/uL (ref 0.7–4.0)
MCH: 33.4 pg (ref 26.0–34.0)
MCHC: 34 g/dL (ref 30.0–36.0)
MCV: 98.2 fL (ref 80.0–100.0)
Monocytes Absolute: 0.9 10*3/uL (ref 0.1–1.0)
Monocytes Relative: 14 %
Neutro Abs: 4.1 10*3/uL (ref 1.7–7.7)
Neutrophils Relative %: 63 %
Platelets: 166 10*3/uL (ref 150–400)
RBC: 3.83 MIL/uL — ABNORMAL LOW (ref 3.87–5.11)
RDW: 17.4 % — ABNORMAL HIGH (ref 11.5–15.5)
WBC: 6.4 10*3/uL (ref 4.0–10.5)
nRBC: 0 % (ref 0.0–0.2)

## 2019-01-01 LAB — ETHANOL: Alcohol, Ethyl (B): <10 mg/dL (ref <10)

## 2019-01-01 LAB — I-STAT TROPONIN, ED: Troponin i, poc: 0.01 ng/mL (ref 0.00–0.08)

## 2019-01-01 LAB — PHOSPHORUS: Phosphorus: 4.5 mg/dL (ref 2.5–4.6)

## 2019-01-01 LAB — MAGNESIUM: Magnesium: 2.6 mg/dL — ABNORMAL HIGH (ref 1.7–2.4)

## 2019-01-01 LAB — LIPASE, BLOOD: Lipase: 65 U/L — ABNORMAL HIGH (ref 11–51)

## 2019-01-01 LAB — URINALYSIS, ROUTINE W REFLEX MICROSCOPIC
Bilirubin Urine: NEGATIVE
Glucose, UA: NEGATIVE mg/dL
Hgb urine dipstick: NEGATIVE
Ketones, ur: NEGATIVE mg/dL
Leukocytes, UA: NEGATIVE
Nitrite: NEGATIVE
Protein, ur: NEGATIVE mg/dL
Specific Gravity, Urine: 1.014 (ref 1.005–1.030)
pH: 8 (ref 5.0–8.0)

## 2019-01-01 LAB — INFLUENZA PANEL BY PCR (TYPE A & B)
Influenza A By PCR: NEGATIVE
Influenza B By PCR: NEGATIVE

## 2019-01-01 LAB — AMMONIA: Ammonia: 71 umol/L — ABNORMAL HIGH (ref 9–35)

## 2019-01-01 LAB — LACTIC ACID, PLASMA
Lactic Acid, Venous: 2 mmol/L (ref 0.5–1.9)
Lactic Acid, Venous: 1.7 mmol/L (ref 0.5–1.9)

## 2019-01-01 MED ORDER — ASPIRIN EC 81 MG PO TBEC
81.0000 mg | DELAYED_RELEASE_TABLET | Freq: Every day | ORAL | Status: DC
  Administered 2019-01-01 – 2019-01-04 (×4): 81 mg via ORAL
  Filled 2019-01-01 (×4): qty 1

## 2019-01-01 MED ORDER — LACTULOSE 10 GM/15ML PO SOLN
10.0000 g | Freq: Three times a day (TID) | ORAL | Status: DC
  Administered 2019-01-01 – 2019-01-02 (×5): 10 g via ORAL
  Filled 2019-01-01 (×5): qty 30

## 2019-01-01 MED ORDER — ONDANSETRON HCL 4 MG PO TABS: 4.0000 mg | ORAL_TABLET | Freq: Four times a day (QID) | ORAL | Status: DC | PRN

## 2019-01-01 MED ORDER — ONDANSETRON HCL 4 MG/2ML IJ SOLN: 4.0000 mg | Freq: Four times a day (QID) | INTRAMUSCULAR | Status: DC | PRN

## 2019-01-01 MED ORDER — ACETAMINOPHEN 325 MG PO TABS: 650.0000 mg | ORAL_TABLET | Freq: Four times a day (QID) | ORAL | Status: DC | PRN

## 2019-01-01 MED ORDER — ACETAMINOPHEN 650 MG RE SUPP: 650.0000 mg | Freq: Four times a day (QID) | RECTAL | Status: DC | PRN

## 2019-01-01 MED ORDER — PANTOPRAZOLE SODIUM 40 MG PO TBEC
40.0000 mg | DELAYED_RELEASE_TABLET | Freq: Two times a day (BID) | ORAL | Status: DC
  Administered 2019-01-01 – 2019-01-04 (×6): 40 mg via ORAL
  Filled 2019-01-01 (×7): qty 1

## 2019-01-01 MED ORDER — MAGNESIUM OXIDE 400 (241.3 MG) MG PO TABS
400.0000 mg | ORAL_TABLET | Freq: Every day | ORAL | Status: DC
  Administered 2019-01-01 – 2019-01-04 (×4): 400 mg via ORAL
  Filled 2019-01-01 (×4): qty 1

## 2019-01-01 MED ORDER — LACTATED RINGERS IV SOLN
INTRAVENOUS | Status: DC
  Administered 2019-01-01 – 2019-01-02 (×3): via INTRAVENOUS

## 2019-01-01 MED ORDER — POTASSIUM CHLORIDE CRYS ER 20 MEQ PO TBCR
40.0000 meq | EXTENDED_RELEASE_TABLET | Freq: Every day | ORAL | Status: DC
  Filled 2019-01-01: qty 2

## 2019-01-01 MED ORDER — LACTATED RINGERS IV BOLUS
1000.0000 mL | Freq: Once | INTRAVENOUS | Status: AC
  Administered 2019-01-01: 1000 mL via INTRAVENOUS

## 2019-01-01 NOTE — Progress Notes (Signed)
Orthostatic b/p attempted at 1614. P.t. unable to stand. P.t. unable to sit on side of bed and dangle feed without support. Placed p.t. in sitting position in bed to obtain the sitting B/P and Pulse. P.t. did not tolerate well and wanted the bed to be lowered.

## 2019-01-01 NOTE — H&P (Signed)
History and Physical    [name]:[id number] DOB: 1946/03/28 DOA: 01/01/2019  PCP: Janie Morning, DO  Patient coming from: home   I have personally briefly reviewed patient's old medical records available.   Chief Complaint: profound weakness and unable to walk.   HPI: Darlene Delacruz is a 73 y.o. female with medical history significant of hypertension, hyperlipidemia, thrombocytopenia, alcohol abuse, chronic liver disease who was recently admitted to the hospital with fluid overload secondary to chronic liver disease and was treated aggressively with diuretics and fluid restrictions presents back to the emergency room with her family with profound lethargy and unable to get around.  Patient is accompanied by her son and daughter.  I reviewed her previous hospitalizations.  Patient apparently has developed alcoholic liver cirrhosis and was 2 times in the hospital last few months with pneumonia and then with fluid overload.  She was discharged home on 12/22/2018 after she was treated with IV diuresis, fluid restrictions.  She was discharged on Lasix and spironolactone.  She also had viral infection that was treated with prednisone.  According to the family, she lives by herself she did well for few days, she is then becoming more lethargic every day and currently unable to function.  She feels dry.  No fever or chills.  Denies any chest pain.  Urine is occasionally foul-smelling.  Denies any abdominal pain. ED Course: Lethargic.  Hemodynamically stable.  Clinically dry on evaluation.  Chest x-ray normal.  Lactate is 2.  Urinalysis with no evidence of infection.  Creatinine 1.4 with evidence of acute kidney injury.  Alcohol is negative.  Ammonia is 71.  Patient was given 1 L of Ringer's lactate in the ER.  Review of Systems: As per HPI otherwise 10 point review of systems negative.    Past Medical History:  Diagnosis Date  . Alcoholic cirrhosis ([place])   . Alcoholism ([place])   . Arthritis   .  Hyperlipidemia   . Spastic esophagus     Past Surgical History:  Procedure Laterality Date  . ABDOMINAL HYSTERECTOMY     menorrhagia  . BUNIONECTOMY     both feet  . CARPAL TUNNEL RELEASE  10/13/2011   Procedure: CARPAL TUNNEL RELEASE;  Surgeon: Wynonia Sours, MD;  Location: [facility];  Service: Orthopedics;  Laterality: Right;  . COLONOSCOPY  2018   Dr. Collene Mares   . EAR CYST EXCISION  10/13/2011   Procedure: CYST REMOVAL;  Surgeon: Wynonia Sours, MD;  Location: [facility];  Service: Orthopedics;  Laterality: Right;  excision cyst possible rotator flap, debridement dip right middle finger  . left knee arthroscopy    . right rotator cuff repair       reports that she quit smoking about 27 years ago. She has never used smokeless tobacco. She reports that she does not drink alcohol or use drugs.  No Known Allergies  Family History  Problem Relation Age of Onset  . Heart failure Mother      Prior to Admission medications   Medication Sig Start Date End Date Taking? Authorizing Provider  aspirin 81 MG tablet Take 81 mg by mouth daily.     [provider]  furosemide (LASIX) 80 MG tablet Take 1 tablet (80 mg total) by mouth 2 (two) times daily. 12/22/18   Aline August, MD  guaiFENesin-dextromethorphan (ROBITUSSIN DM) 100-10 MG/5ML syrup Take 5 mLs by mouth every 4 (four) hours as needed for cough. 12/22/18   Starla Link,  Kshitiz, MD  magnesium oxide (MAG-OX) 400 (241.3 Mg) MG tablet Take 1 tablet (400 mg total) by mouth daily. 12/22/18   Aline August, MD  ondansetron (ZOFRAN) 4 MG tablet Take 1 tablet (4 mg total) by mouth every 6 (six) hours as needed for nausea. 12/22/18   Aline August, MD  pantoprazole (PROTONIX) 40 MG tablet Take 1 tablet (40 mg total) by mouth 2 (two) times daily before a meal for 30 days. 12/22/18 01/21/19  Aline August, MD  potassium chloride SA (K-DUR,KLOR-CON) 20 MEQ tablet Take 2 tablets (40 mEq total) by mouth daily. 12/22/18    Aline August, MD  spironolactone (ALDACTONE) 100 MG tablet Take 1 tablet (100 mg total) by mouth daily. 12/22/18   Aline August, MD    Physical Exam: Vitals:   01/01/19 1000 01/01/19 1015 01/01/19 1030 01/01/19 1045  BP: (!) 144/63 (!) 149/85 139/62 (!) 142/71  Pulse: 79 83 82 80  Resp: 15 17 18 16   Temp:      TempSrc:      SpO2: 97% 97% 95% 97%    Constitutional: NAD, calm, comfortable Vitals:   01/01/19 1000 01/01/19 1015 01/01/19 1030 01/01/19 1045  BP: (!) 144/63 (!) 149/85 139/62 (!) 142/71  Pulse: 79 83 82 80  Resp: 15 17 18 16   Temp:      TempSrc:      SpO2: 97% 97% 95% 97%   Eyes: PERRL, lids and conjunctivae normal ENMT: Mucous membranes are dry. Posterior pharynx clear of any exudate or lesions.Normal dentition.  Neck: normal, supple, no masses, no thyromegaly Respiratory: clear to auscultation bilaterally, no wheezing, no crackles. Normal respiratory effort. No accessory muscle use.  Cardiovascular: Regular rate and rhythm, no murmurs / rubs / gallops. No extremity edema. 2+ pedal pulses. No carotid bruits.  Abdomen: no tenderness, no masses palpated. No hepatosplenomegaly. Bowel sounds positive.  Musculoskeletal: no clubbing / cyanosis. No joint deformity upper and lower extremities. Good ROM, no contractures. Normal muscle tone.  Skin: no rashes, lesions, ulcers. No induration Neurologic: CN 2-12 grossly intact. Sensation intact, DTR normal. Strength 5/5 in all 4.  Psychiatric: Normal judgment and insight. Alert and oriented x 3.  Sleepy and lethargic.  She can answer questions appropriately.    Labs on Admission: I have personally reviewed following labs and imaging studies  CBC: Recent Labs  Lab 01/01/19 0720  WBC 6.4  NEUTROABS 4.1  HGB 12.8  HCT 37.6  MCV 98.2  PLT 250   Basic Metabolic Panel: Recent Labs  Lab 01/01/19 0720  NA 133*  K 4.9  CL 98  CO2 22  GLUCOSE 113*  BUN 31*  CREATININE 1.41*  CALCIUM 9.5   GFR: Estimated  Creatinine Clearance: 41.4 mL/min (A) (by C-G formula based on SCr of 1.41 mg/dL (H)). Liver Function Tests: Recent Labs  Lab 01/01/19 0720  AST 40  ALT 40  ALKPHOS 96  BILITOT 3.8*  PROT 8.0  ALBUMIN 3.3*   Recent Labs  Lab 01/01/19 0720  LIPASE 65*   Recent Labs  Lab 01/01/19 0938  AMMONIA 71*   Coagulation Profile: No results for input(s): INR, PROTIME in the last 168 hours. Cardiac Enzymes: No results for input(s): CKTOTAL, CKMB, CKMBINDEX, TROPONINI in the last 168 hours. BNP (last 3 results) No results for input(s): PROBNP in the last 8760 hours. HbA1C: No results for input(s): HGBA1C in the last 72 hours. CBG: No results for input(s): GLUCAP in the last 168 hours. Lipid Profile: No results for input(s):  CHOL, HDL, LDLCALC, TRIG, CHOLHDL, LDLDIRECT in the last 72 hours. Thyroid Function Tests: No results for input(s): TSH, T4TOTAL, FREET4, T3FREE, THYROIDAB in the last 72 hours. Anemia Panel: No results for input(s): VITAMINB12, FOLATE, FERRITIN, TIBC, IRON, RETICCTPCT in the last 72 hours. Urine analysis:    Component Value Date/Time   COLORURINE YELLOW 01/01/2019 0942   APPEARANCEUR CLEAR 01/01/2019 0942   LABSPEC 1.014 01/01/2019 0942   PHURINE 8.0 01/01/2019 [place] 01/01/2019 0942   HGBUR NEGATIVE 01/01/2019 [place] NEGATIVE 01/01/2019 0942   BILIRUBINUR 2 03/06/2011 [place] 01/01/2019 [place] 01/01/2019 0942   UROBILINOGEN neg 03/06/2011 1505   NITRITE NEGATIVE 01/01/2019 0942   LEUKOCYTESUR NEGATIVE 01/01/2019 0942    Radiological Exams on Admission: Dg Chest Port 1 View  Result Date: 01/01/2019 CLINICAL DATA:  Weakness EXAM: PORTABLE CHEST 1 VIEW COMPARISON:  12/15/2018 FINDINGS: Stable cardiomegaly and mediastinal contours. Interstitial coarsening similar to priors. Mild retrocardiac atelectasis or scarring, also seen on abdominal CT 12/16/2018. IMPRESSION: No acute finding when  compared to priors. Electronically Signed   By: Monte Fantasia M.D.   On: 01/01/2019 07:46    EKG: Independently reviewed.  Normal sinus rhythm.  No acute changes.  Assessment/Plan Principal Problem:   Acute metabolic encephalopathy Active Problems:   HLD (hyperlipidemia)   Essential hypertension   Liver cirrhosis (HCC)   AKI (acute kidney injury) (HCC)   Increased ammonia level   Encephalopathy acute   Acute metabolic encephalopathy: Suspect due to severe dehydration with fluid restriction and continuous use of diuretics.  Hold all diuretics.  Will hydrate gradually.  Currently no evidence of acute infection.  She was treated for enterococcal UTI last admission.  Will hold off on restarting any antibiotics.  Send for urine cultures. Work with PT OT when alert.  Chronic liver failure with hyperammonemia: With no other clinical evidence of fulminant hepatic failure.  Ammonia 71.  Will start patient on lactulose.  Abdomen is soft with no evidence of infection.  Will ensure 2-3 loose bowel movement.  Recheck ammonia level in the morning.  Patient has progressive chronic liver failure.  Patient had anasarca related to liver disease and currently on Lasix 80 mg twice a day and Aldactone 100 mg once a day.  Once patient becomes hydrated and stable, she will benefit with smaller doses of diuretics on discharge.  Hypertension: Stable.  Resume home medications.  Hyperlipidemia: Not on a statin because of liver failure.  GERD: On PPI.  To continue.  DVT prophylaxis: SCDs.  Code Status: Full code. Family Communication: Son and daughter at the bedside. Disposition Plan: Home. Consults called: None. Admission status: Inpatient.   Barb Merino MD [facility] Pager [phone]  If 7PM-7AM, please contact night-coverage [email or web address] Password [facility]  01/01/2019, 11:35 AM

## 2019-01-01 NOTE — ED Triage Notes (Signed)
Pt arrived [facility]; pt frm hm with c/o increased wkness x2 days; pt normally able to amb with walker but too weak to walk at the moment. EMS and family noted strong, foul odor and dark  Urine; 146/72, 124, 97% on RA, 82, 101.2

## 2019-01-01 NOTE — Progress Notes (Signed)
P.t has arrive don 3w. Son is at bedside. P.t. has cell phone and robe on her person. P.t. is alert and orientaed x4. P.t. is unable to stand due to weakness.

## 2019-01-01 NOTE — ED Provider Notes (Signed)
[facility] EMERGENCY DEPARTMENT Provider Note   CSN: [id number] Arrival date & time: 01/01/19  7654     History   Chief Complaint Chief Complaint  Patient presents with  . Weakness    HPI Darlene Delacruz is a 72 y.o. female.  The history is provided by the patient.  Weakness  Severity:  Moderate Onset quality:  Gradual Timing:  Constant Progression:  Worsening Chronicity:  New Context: dehydration and urinary tract infection   Context: not alcohol use and not recent infection   Relieved by:  Nothing Worsened by:  Nothing Associated symptoms: foul-smelling urine, lethargy and myalgias   Associated symptoms: no abdominal pain, no arthralgias, no chest pain, no cough, no dysuria, no fever, no seizures, no shortness of breath and no vomiting   Risk factors comment:  ETOH abuse, cirrhosis   Past Medical History:  Diagnosis Date  . Alcoholic cirrhosis ([place])   . Alcoholism ([place])   . Arthritis   . Hyperlipidemia   . Spastic esophagus     Patient Active Problem List   Diagnosis Date Noted  . Alcoholism ([place])   . Anasarca 12/16/2018  . Liver cirrhosis ([place]) 12/16/2018  . Alcohol abuse 12/16/2018  . Hypokalemia 12/16/2018  . Hypomagnesemia 12/16/2018  . Thrombocytopenia ([place]) 12/16/2018  . Normocytic anemia 12/16/2018  . Abnormal LFTs 12/16/2018  . Cirrhosis of liver ([place])   . Abdominal pain, left lower quadrant 03/06/2011  . SINUSITIS - ACUTE-NOS 11/28/2010  . OTHER ACUTE SINUSITIS 11/19/2010  . TRANSAMINASES, SERUM, ELEVATED 10/27/2010  . ESOPHAGEAL SPASM 09/29/2010  . Essential hypertension 09/25/2010  . SINUS BRADYCARDIA 09/25/2010  . DIZZINESS 09/25/2010  . ANXIETY, SITUATIONAL 06/26/2010  . HLD (hyperlipidemia) 12/13/2009  . OSTEOPENIA 12/13/2009  . ELEVATED BP READING WITHOUT DX HYPERTENSION 12/13/2009    Past Surgical History:  Procedure Laterality Date  . ABDOMINAL HYSTERECTOMY     menorrhagia  . BUNIONECTOMY     both feet  .  CARPAL TUNNEL RELEASE  10/13/2011   Procedure: CARPAL TUNNEL RELEASE;  Surgeon: Wynonia Sours, MD;  Location: [facility];  Service: Orthopedics;  Laterality: Right;  . COLONOSCOPY  2018   Dr. Collene Mares   . EAR CYST EXCISION  10/13/2011   Procedure: CYST REMOVAL;  Surgeon: Wynonia Sours, MD;  Location: [facility];  Service: Orthopedics;  Laterality: Right;  excision cyst possible rotator flap, debridement dip right middle finger  . left knee arthroscopy    . right rotator cuff repair       OB History   No obstetric history on file.      Home Medications    Prior to Admission medications   Medication Sig Start Date End Date Taking? Authorizing Provider  aspirin 81 MG tablet Take 81 mg by mouth daily.     [provider]  furosemide (LASIX) 80 MG tablet Take 1 tablet (80 mg total) by mouth 2 (two) times daily. 12/22/18   Aline August, MD  guaiFENesin-dextromethorphan (ROBITUSSIN DM) 100-10 MG/5ML syrup Take 5 mLs by mouth every 4 (four) hours as needed for cough. 12/22/18   Aline August, MD  magnesium oxide (MAG-OX) 400 (241.3 Mg) MG tablet Take 1 tablet (400 mg total) by mouth daily. 12/22/18   Aline August, MD  ondansetron (ZOFRAN) 4 MG tablet Take 1 tablet (4 mg total) by mouth every 6 (six) hours as needed for nausea. 12/22/18   Aline August, MD  pantoprazole (PROTONIX) 40 MG tablet Take 1 tablet (  40 mg total) by mouth 2 (two) times daily before a meal for 30 days. 12/22/18 01/21/19  Aline August, MD  potassium chloride SA (K-DUR,KLOR-CON) 20 MEQ tablet Take 2 tablets (40 mEq total) by mouth daily. 12/22/18   Aline August, MD  pravastatin (PRAVACHOL) 20 MG tablet Take 1 tablet (20 mg total) by mouth at bedtime. Patient not taking: Reported on 12/16/2018 08/20/11   Lucille Passy, MD  spironolactone (ALDACTONE) 100 MG tablet Take 1 tablet (100 mg total) by mouth daily. 12/22/18   Aline August, MD    Family History Family History  Problem Relation Age  of Onset  . Heart failure Mother     Social History Social History   Tobacco Use  . Smoking status: Former Smoker    Last attempt to quit: 04/07/1991    Years since quitting: 27.7  . Smokeless tobacco: Never Used  Substance Use Topics  . Alcohol use: No  . Drug use: No     Allergies   Patient has no known allergies.   Review of Systems Review of Systems  Constitutional: Positive for appetite change and chills. Negative for fever.  HENT: Negative for ear pain and sore throat.   Eyes: Negative for pain and visual disturbance.  Respiratory: Negative for cough and shortness of breath.   Cardiovascular: Negative for chest pain and palpitations.  Gastrointestinal: Negative for abdominal pain and vomiting.  Genitourinary: Negative for dysuria and hematuria.  Musculoskeletal: Positive for gait problem (baseline with walker) and myalgias. Negative for arthralgias and back pain.  Skin: Negative for color change and rash.  Neurological: Positive for weakness. Negative for seizures and syncope.  All other systems reviewed and are negative.    Physical Exam Updated Vital Signs  ED Triage Vitals  Enc Vitals Group     BP 01/01/19 0724 (!) 148/70     Pulse Rate 01/01/19 0724 78     Resp 01/01/19 0724 18     Temp 01/01/19 0724 (!) 97.1 F (36.2 C)     Temp Source 01/01/19 0724 Rectal     SpO2 01/01/19 0724 97 %     Weight --      Height --      Head Circumference --      Peak Flow --      Pain Score 01/01/19 0703 0     Pain Loc --      Pain Edu? --      Excl. in [place]? --     Physical Exam Vitals signs and nursing note reviewed.  Constitutional:      General: She is not in acute distress.    Appearance: She is well-developed. She is ill-appearing.  HENT:     Head: Normocephalic and atraumatic.     Nose: Nose normal.     Mouth/Throat:     Mouth: Mucous membranes are moist.  Eyes:     Extraocular Movements: Extraocular movements intact.     Conjunctiva/sclera:  Conjunctivae normal.     Pupils: Pupils are equal, round, and reactive to light.  Neck:     Musculoskeletal: Normal range of motion and neck supple.  Cardiovascular:     Rate and Rhythm: Normal rate and regular rhythm.     Pulses: Normal pulses.     Heart sounds: Normal heart sounds. No murmur.  Pulmonary:     Effort: Pulmonary effort is normal. No respiratory distress.     Breath sounds: Normal breath sounds.  Abdominal:  General: There is no distension.     Palpations: Abdomen is soft.     Tenderness: There is no abdominal tenderness.  Musculoskeletal:     Right lower leg: No edema.     Left lower leg: No edema.  Skin:    General: Skin is warm and dry.  Neurological:     General: No focal deficit present.     Mental Status: She is alert and oriented to person, place, and time.     Cranial Nerves: No cranial nerve deficit.     Sensory: No sensory deficit.     Motor: No weakness.     Coordination: Coordination normal.      ED Treatments / Results  Labs (all labs ordered are listed, but only abnormal results are displayed) Labs Reviewed  LACTIC ACID, PLASMA - Abnormal; Notable for the following components:      Result Value   Lactic Acid, Venous 2.0 (*)    All other components within normal limits  COMPREHENSIVE METABOLIC PANEL - Abnormal; Notable for the following components:   Sodium 133 (*)    Glucose, Bld 113 (*)    BUN 31 (*)    Creatinine, Ser 1.41 (*)    Albumin 3.3 (*)    Total Bilirubin 3.8 (*)    GFR calc non Af Amer 37 (*)    GFR calc Af Amer 43 (*)    All other components within normal limits  CBC WITH DIFFERENTIAL/PLATELET - Abnormal; Notable for the following components:   RBC 3.83 (*)    RDW 17.4 (*)    All other components within normal limits  LIPASE, BLOOD - Abnormal; Notable for the following components:   Lipase 65 (*)    All other components within normal limits  AMMONIA - Abnormal; Notable for the following components:   Ammonia 71 (*)     All other components within normal limits  CULTURE, BLOOD (ROUTINE X 2)  CULTURE, BLOOD (ROUTINE X 2)  URINE CULTURE  LACTIC ACID, PLASMA  URINALYSIS, ROUTINE W REFLEX MICROSCOPIC  INFLUENZA PANEL BY PCR (TYPE A & B)  ETHANOL  I-STAT TROPONIN, ED    EKG EKG Interpretation  Date/Time:  Sunday January 01 2019 07:17:47 EST Ventricular Rate:  77 PR Interval:    QRS Duration: 94 QT Interval:  419 QTC Calculation: 475 R Axis:   51 Text Interpretation:  Sinus rhythm Prominent P waves, nondiagnostic Confirmed by Lennice Sites [phone]) on 01/01/2019 8:06:13 AM   Radiology Dg Chest Port 1 View  Result Date: 01/01/2019 CLINICAL DATA:  Weakness EXAM: PORTABLE CHEST 1 VIEW COMPARISON:  12/15/2018 FINDINGS: Stable cardiomegaly and mediastinal contours. Interstitial coarsening similar to priors. Mild retrocardiac atelectasis or scarring, also seen on abdominal CT 12/16/2018. IMPRESSION: No acute finding when compared to priors. Electronically Signed   By: Monte Fantasia M.D.   On: 01/01/2019 07:46    Procedures Procedures (including critical care time)  Medications Ordered in ED Medications  lactated ringers bolus 1,000 mL (0 mLs Intravenous Stopped 01/01/19 0911)     Initial Impression / Assessment and Plan / ED Course  I have reviewed the triage vital signs and the nursing notes.  Pertinent labs & imaging results that were available during my care of the patient were reviewed by me and considered in my medical decision making (see chart for details).     Darlene Delacruz is a 73 year old female with history of alcohol abuse, cirrhosis who presents the ED with generalized weakness.  Patient  with normal vitals.  No fever.  Possibly had a fever with EMS.  Had a recent admission for volume overload.  Did have some foul-smelling urine.  She just feels generally weak.  Has some body aches.  Suspect possible viral process, urinary tract infection.  Appears neurologically intact.  No  abdominal tenderness.  Clinically she looks dehydrated.  Infectious work-up was initiated with blood cultures, urine studies, chest x-ray.  Patient given IV fluid bolus.  Urinalysis negative for infection.  No signs of pneumonia.  Lactic acid elevated at 2.  Patient with acute kidney injury with a creatinine of 1.4.  Alcohol negative.  Ammonia mildly elevated at 71.  No significant leukocytosis, anemia.  Overall appears that patient is dehydrated.  Possibly over diuresed.  Possible viral process however flu panel is negative.  Patient to be admitted for further hydration.  Lactic acid improved after LR bolus.  This chart was dictated using voice recognition software.  Despite best efforts to proofread,  errors can occur which can change the documentation meaning.   Final Clinical Impressions(s) / ED Diagnoses   Final diagnoses:  Dehydration  Weakness    ED Discharge Orders    None       Lennice Sites, DO 01/01/19 1114

## 2019-01-02 ENCOUNTER — Other Ambulatory Visit: Payer: Self-pay

## 2019-01-02 DIAGNOSIS — N179 Acute kidney failure, unspecified: Secondary | ICD-10-CM

## 2019-01-02 DIAGNOSIS — I10 Essential (primary) hypertension: Secondary | ICD-10-CM

## 2019-01-02 DIAGNOSIS — E860 Dehydration: Secondary | ICD-10-CM

## 2019-01-02 LAB — COMPREHENSIVE METABOLIC PANEL
ALT: 36 U/L (ref 0–44)
AST: 44 U/L — ABNORMAL HIGH (ref 15–41)
Albumin: 2.6 g/dL — ABNORMAL LOW (ref 3.5–5.0)
Alkaline Phosphatase: 86 U/L (ref 38–126)
Anion gap: 7 (ref 5–15)
BUN: 16 mg/dL (ref 8–23)
CO2: 20 mmol/L — ABNORMAL LOW (ref 22–32)
Calcium: 8.6 mg/dL — ABNORMAL LOW (ref 8.9–10.3)
Chloride: 108 mmol/L (ref 98–111)
Creatinine, Ser: 0.76 mg/dL (ref 0.44–1.00)
GFR calc Af Amer: >60 mL/min (ref >60)
GFR calc non Af Amer: >60 mL/min (ref >60)
Glucose, Bld: 92 mg/dL (ref 70–99)
Potassium: 4 mmol/L (ref 3.5–5.1)
Sodium: 135 mmol/L (ref 135–145)
Total Bilirubin: 3.4 mg/dL — ABNORMAL HIGH (ref 0.3–1.2)
Total Protein: 6.5 g/dL (ref 6.5–8.1)

## 2019-01-02 LAB — CBC
HCT: 34.6 % — ABNORMAL LOW (ref 36.0–46.0)
Hemoglobin: 11.7 g/dL — ABNORMAL LOW (ref 12.0–15.0)
MCH: 33.5 pg (ref 26.0–34.0)
MCHC: 33.8 g/dL (ref 30.0–36.0)
MCV: 99.1 fL (ref 80.0–100.0)
Platelets: 124 10*3/uL — ABNORMAL LOW (ref 150–400)
RBC: 3.49 MIL/uL — ABNORMAL LOW (ref 3.87–5.11)
RDW: 17.2 % — ABNORMAL HIGH (ref 11.5–15.5)
WBC: 5.8 10*3/uL (ref 4.0–10.5)
nRBC: 0 % (ref 0.0–0.2)

## 2019-01-02 LAB — PROTIME-INR
INR: 1.41
Prothrombin Time: 17.1 seconds — ABNORMAL HIGH (ref 11.4–15.2)

## 2019-01-02 LAB — AMMONIA: Ammonia: 55 umol/L — ABNORMAL HIGH (ref 9–35)

## 2019-01-02 MED ORDER — LACTULOSE 10 GM/15ML PO SOLN
30.0000 g | Freq: Once | ORAL | Status: AC
  Administered 2019-01-03: 30 g via ORAL
  Filled 2019-01-02 (×2): qty 60

## 2019-01-02 MED ORDER — SPIRONOLACTONE 25 MG PO TABS
50.0000 mg | ORAL_TABLET | Freq: Every day | ORAL | Status: DC
  Administered 2019-01-02 – 2019-01-04 (×3): 50 mg via ORAL
  Filled 2019-01-02 (×3): qty 2

## 2019-01-02 MED ORDER — FUROSEMIDE 20 MG PO TABS: 20.0000 mg | ORAL_TABLET | Freq: Two times a day (BID) | ORAL | Status: DC

## 2019-01-02 MED ORDER — LACTULOSE 10 GM/15ML PO SOLN: 20.0000 g | Freq: Three times a day (TID) | ORAL | Status: DC

## 2019-01-02 MED ORDER — SPIRONOLACTONE 25 MG PO TABS: 100.0000 mg | ORAL_TABLET | Freq: Every day | ORAL | Status: DC

## 2019-01-02 MED ORDER — FUROSEMIDE 20 MG PO TABS
20.0000 mg | ORAL_TABLET | Freq: Every day | ORAL | Status: DC
  Administered 2019-01-02 – 2019-01-04 (×3): 20 mg via ORAL
  Filled 2019-01-02 (×3): qty 1

## 2019-01-02 NOTE — NC FL2 (Signed)
[place] LEVEL OF CARE SCREENING TOOL     IDENTIFICATION  Patient Name: Darlene Delacruz Birthdate: 11/27/1946 Sex: female Admission Date (Current Location): 01/01/2019  [facility] and [place] Number:  Herbalist and Address:  [facility], [place] [address] [place], [place] [address]      Provider Number: [id number]  Attending Physician Name and Address:  Jonetta Osgood, MD  Relative Name and Phone Number:       Current Level of Care: Hospital Recommended Level of Care: [place] Prior Approval Number:    Date Approved/Denied:   PASRR Number: [id number]  Discharge Plan: SNF    Current Diagnoses: Patient Active Problem List   Diagnosis Date Noted  . Acute metabolic encephalopathy 97/01/6377  . AKI (acute kidney injury) ([place]) 01/01/2019  . Increased ammonia level 01/01/2019  . Encephalopathy acute 01/01/2019  . Alcoholism ([place])   . Anasarca 12/16/2018  . Liver cirrhosis ([place]) 12/16/2018  . Alcohol abuse 12/16/2018  . Hypokalemia 12/16/2018  . Hypomagnesemia 12/16/2018  . Thrombocytopenia ([place]) 12/16/2018  . Normocytic anemia 12/16/2018  . Abnormal LFTs 12/16/2018  . Cirrhosis of liver ([place])   . Abdominal pain, left lower quadrant 03/06/2011  . SINUSITIS - ACUTE-NOS 11/28/2010  . OTHER ACUTE SINUSITIS 11/19/2010  . TRANSAMINASES, SERUM, ELEVATED 10/27/2010  . ESOPHAGEAL SPASM 09/29/2010  . Essential hypertension 09/25/2010  . SINUS BRADYCARDIA 09/25/2010  . DIZZINESS 09/25/2010  . ANXIETY, SITUATIONAL 06/26/2010  . HLD (hyperlipidemia) 12/13/2009  . OSTEOPENIA 12/13/2009  . ELEVATED BP READING WITHOUT DX HYPERTENSION 12/13/2009    Orientation RESPIRATION BLADDER Height & Weight     Self, Time, Situation, Place  Normal Continent Weight: 133 lb 2.5 oz (60.4 kg) Height:  5\' 6"  (167.6 cm)  BEHAVIORAL SYMPTOMS/MOOD NEUROLOGICAL BOWEL NUTRITION STATUS      Continent Diet(see DC summary)  AMBULATORY  STATUS COMMUNICATION OF NEEDS Skin   Limited Assist Verbally Normal                       Personal Care Assistance Level of Assistance  Bathing, Feeding, Dressing Bathing Assistance: Limited assistance Feeding assistance: Independent Dressing Assistance: Limited assistance     Functional Limitations Info  Sight, Hearing, Speech Sight Info: Impaired(wears glasses) Hearing Info: Adequate Speech Info: Adequate    SPECIAL CARE FACTORS FREQUENCY  PT (By licensed PT), OT (By licensed OT)     PT Frequency: 5x/wk OT Frequency: 5x/wk            Contractures Contractures Info: Not present    Additional Factors Info  Code Status, Allergies Code Status Info: Full Allergies Info: NKA           Current Medications (01/02/2019):  This is the current hospital active medication list Current Facility-Administered Medications  Medication Dose Route Frequency Provider Last Rate Last Dose  . acetaminophen (TYLENOL) tablet 650 mg  650 mg Oral Q6H PRN Barb Merino, MD       Or  . acetaminophen (TYLENOL) suppository 650 mg  650 mg Rectal Q6H PRN Barb Merino, MD      . aspirin EC tablet 81 mg  81 mg Oral Daily Barb Merino, MD   81 mg at 01/02/19 0858  . furosemide (LASIX) tablet 20 mg  20 mg Oral Daily Ghimire, Henreitta Leber, MD      . lactated ringers infusion   Intravenous Continuous Jonetta Osgood, MD 60 mL/hr at 01/02/19 1055    .  lactulose (CHRONULAC) 10 GM/15ML solution 10 g  10 g Oral TID Barb Merino, MD   10 g at 01/02/19 0859  . magnesium oxide (MAG-OX) tablet 400 mg  400 mg Oral Daily Barb Merino, MD   400 mg at 01/02/19 0859  . ondansetron (ZOFRAN) tablet 4 mg  4 mg Oral Q6H PRN Barb Merino, MD       Or  . ondansetron (ZOFRAN) injection 4 mg  4 mg Intravenous Q6H PRN Barb Merino, MD      . pantoprazole (PROTONIX) EC tablet 40 mg  40 mg Oral BID AC Barb Merino, MD   40 mg at 01/02/19 0859  . spironolactone (ALDACTONE) tablet 50 mg  50 mg Oral Daily  Ghimire, Henreitta Leber, MD         Discharge Medications: Please see discharge summary for a list of discharge medications.  Relevant Imaging Results:  Relevant Lab Results:   Additional Information SS#: [id number]  Geralynn Ochs, LCSW

## 2019-01-02 NOTE — Care Management Note (Signed)
Case Management Note  Patient Details  Name: MACKINLEY CASSADAY MRN: [id number] Date of Birth: [birth year]  Subjective/Objective:    Pt admitted with acute metabolic encephalopathy. She is from home alone.  Pt was active with [facility] prior to admission.               Action/Plan: Recommendations are for SNF. Pt wanting to d/c home with continuation of [place] services. CM met with her and family and she wants to talk SNF over with her family tonight. She is agreeable to being faxed out in the [place] area. CSW updated. CM following.  Expected Discharge Date:                  Expected Discharge Plan:  Skilled Nursing Facility  In-House Referral:  Clinical Social Work  Discharge planning Services  CM Consult  Post Acute Care Choice:    Choice offered to:     DME Arranged:    DME Agency:     HH Arranged:    [place] Agency:     Status of Service:  In process, will continue to follow  If discussed at Long Length of Stay Meetings, dates discussed:    Additional Comments:  Pollie Friar, RN 01/02/2019, 4:09 PM

## 2019-01-02 NOTE — Progress Notes (Signed)
PROGRESS NOTE        PATIENT DETAILS Name: Darlene Delacruz Age: 73 y.o. Sex: female Date of Birth: 1946/12/01 Admit Date: 01/01/2019 Admitting Physician Barb Merino, MD OEV:[id number], Hinton Dyer, DO  Brief Narrative: Patient is a 73 y.o. female with prior history of alcoholic liver cirrhosis-recent hospitalization (discharged on 12/22/2018) after being diuresed (negative balance of 7.8 L)-presented with AKI, and weakness.  Thought to have dehydration in the setting of poor oral intake and ongoing diuretic use.  See below for further details  Subjective: Feels much better than yesterday.  She is awake and alert-answers all my questions appropriately.  Assessment/Plan: Acute metabolic encephalopathy: Dehydration and AKI.  Resolved-she is awake and alert this morning.  AKI: Likely hemodynamically mediated-in the setting of poor oral intake and diuretic use.  She is euvolemic on exam-AKI has resolved with IVF.  Stop IV fluids-restart diuretics but at significantly lower doses (50 mg of Aldactone, 20 mg of Lasix).  Reassess volume status and electrolytes tomorrow.  Hypertension: Controlled-has been restarted on diuretics-follow and optimize accordingly  History of alcoholic liver cirrhosis: Volume status is stable-she is completely awake and alert-diuretics have been restarted-continue lactulose.  Follow with primary gastroenterologist on discharge  Acute debility/deconditioning: Secondary to acute illness/dehydration-AKI-evaluated by PT-SNF on discharge.  Will consult social work.  Thrombocytopenia: Appears to be mild-likely secondary to hypersplenism in the setting of liver cirrhosis.  Stable for periodic monitoring.  Asymptomatic bacteriuria: During her most recent hospitalization-she was treated for Enterococcus faecalis.  Urine cultures on 1/26 is still positive for enterococcus-she has no symptoms of UTI-this likely represents a colonization rather than  infection.  DVT Prophylaxis: SCD's  Code Status: Full code  Family Communication: None at bedside  Disposition Plan: Remain inpatient-SNF on discharge  Antimicrobial agents: Anti-infectives (From admission, onward)   None      Procedures: None  CONSULTS:  None  Time spent: 25- minutes-Greater than 50% of this time was spent in counseling, explanation of diagnosis, planning of further management, and coordination of care.  MEDICATIONS: Scheduled Meds: . aspirin EC  81 mg Oral Daily  . furosemide  20 mg Oral Daily  . lactulose  10 g Oral TID  . magnesium oxide  400 mg Oral Daily  . pantoprazole  40 mg Oral BID AC  . spironolactone  50 mg Oral Daily   Continuous Infusions: . lactated ringers 60 mL/hr at 01/02/19 1055   PRN Meds:.acetaminophen **OR** acetaminophen, ondansetron **OR** ondansetron (ZOFRAN) IV   PHYSICAL EXAM: Vital signs: Vitals:   01/02/19 0100 01/02/19 0101 01/02/19 0319 01/02/19 0729  BP:   140/74 134/67  Pulse:    70  Resp:    14  Temp:   97.8 F (36.6 C) 98 F (36.7 C)  TempSrc: Oral  Oral Oral  SpO2:    96%  Weight:      Height:  5\' 6"  (1.676 m)     Filed Weights   01/02/19 0059  Weight: 60.4 kg   Body mass index is 21.49 kg/m.   General appearance :Awake, alert, not in any distress.  Eyes:Pink conjunctiva HEENT: Atraumatic and Normocephalic Neck: supple Resp:Good air entry bilaterally, no added sounds  CVS: S1 S2 regular, no murmurs.  GI: Bowel sounds present, Non tender and not distended with no gaurding, rigidity or rebound.No organomegaly Extremities: B/L  Lower Ext shows no edema, both legs are warm to touch Neurology:  speech clear,Non focal, sensation is grossly intact. Musculoskeletal:No digital cyanosis Skin:No Rash, warm and dry Wounds:N/A  I have personally reviewed following labs and imaging studies  LABORATORY DATA: CBC: Recent Labs  Lab 01/01/19 0720 01/02/19 0756  WBC 6.4 5.8  NEUTROABS 4.1  --     HGB 12.8 11.7*  HCT 37.6 34.6*  MCV 98.2 99.1  PLT 166 124*    Basic Metabolic Panel: Recent Labs  Lab 01/01/19 0720 01/02/19 0756  NA 133* 135  K 4.9 4.0  CL 98 108  CO2 22 20*  GLUCOSE 113* 92  BUN 31* 16  CREATININE 1.41* 0.76  CALCIUM 9.5 8.6*  MG 2.6*  --   PHOS 4.5  --     GFR: Estimated Creatinine Clearance: 59.5 mL/min (by C-G formula based on SCr of 0.76 mg/dL).  Liver Function Tests: Recent Labs  Lab 01/01/19 0720 01/02/19 0756  AST 40 44*  ALT 40 36  ALKPHOS 96 86  BILITOT 3.8* 3.4*  PROT 8.0 6.5  ALBUMIN 3.3* 2.6*   Recent Labs  Lab 01/01/19 0720  LIPASE 65*   Recent Labs  Lab 01/01/19 0938 01/02/19 0756  AMMONIA 71* 55*    Coagulation Profile: Recent Labs  Lab 01/02/19 0756  INR 1.41    Cardiac Enzymes: No results for input(s): CKTOTAL, CKMB, CKMBINDEX, TROPONINI in the last 168 hours.  BNP (last 3 results) No results for input(s): PROBNP in the last 8760 hours.  HbA1C: No results for input(s): HGBA1C in the last 72 hours.  CBG: No results for input(s): GLUCAP in the last 168 hours.  Lipid Profile: No results for input(s): CHOL, HDL, LDLCALC, TRIG, CHOLHDL, LDLDIRECT in the last 72 hours.  Thyroid Function Tests: No results for input(s): TSH, T4TOTAL, FREET4, T3FREE, THYROIDAB in the last 72 hours.  Anemia Panel: No results for input(s): VITAMINB12, FOLATE, FERRITIN, TIBC, IRON, RETICCTPCT in the last 72 hours.  Urine analysis:    Component Value Date/Time   COLORURINE YELLOW 01/01/2019 [place] 01/01/2019 0942   LABSPEC 1.014 01/01/2019 0942   PHURINE 8.0 01/01/2019 0942   GLUCOSEU NEGATIVE 01/01/2019 0942   HGBUR NEGATIVE 01/01/2019 0942   BILIRUBINUR NEGATIVE 01/01/2019 0942   BILIRUBINUR 2 03/06/2011 1505   KETONESUR NEGATIVE 01/01/2019 0942   PROTEINUR NEGATIVE 01/01/2019 0942   UROBILINOGEN neg 03/06/2011 1505   NITRITE NEGATIVE 01/01/2019 0942   LEUKOCYTESUR NEGATIVE 01/01/2019 0942     Sepsis Labs: Lactic Acid, Venous    Component Value Date/Time   LATICACIDVEN 1.7 01/01/2019 0938    MICROBIOLOGY: Recent Results (from the past 240 hour(s))  Urine culture     Status: Abnormal (Preliminary result)   Collection Time: 01/01/19  9:42 AM  Result Value Ref Range Status   Specimen Description URINE, RANDOM  Final   Special Requests   Final    NONE Performed at [facility], [place] [address], [place], [place] [address]    Culture 50,000 COLONIES/mL ENTEROCOCCUS FAECALIS (A)  Final   Report Status PENDING  Incomplete    RADIOLOGY STUDIES/RESULTS: Dg Chest 2 View  Result Date: 12/15/2018 CLINICAL DATA:  Shortness of breath and fluid retention for 1 day. Recent hospitalization for sepsis and pneumonia. Former smoker. EXAM: CHEST - 2 VIEW COMPARISON:  01/09/2018 FINDINGS: Mild cardiac enlargement. No vascular congestion. Mild diffuse interstitial prominence to the lungs, likely representing early edema. Interstitial pneumonitis also possible. No blunting  of costophrenic angles. No pneumothorax. No focal consolidation. Calcification of the aorta. Degenerative changes in the spine. IMPRESSION: Mild cardiac enlargement. Mild diffuse interstitial pattern to the lungs may represent early edema or interstitial pneumonitis. Electronically Signed   By: Lucienne Capers M.D.   On: 12/15/2018 22:34   Ct Abdomen Pelvis W Contrast  Result Date: 12/16/2018 CLINICAL DATA:  73 y/o  F; weight gain and shortness of breath. EXAM: CT ABDOMEN AND PELVIS WITH CONTRAST TECHNIQUE: Multidetector CT imaging of the abdomen and pelvis was performed using the standard protocol following bolus administration of intravenous contrast. CONTRAST:  151mL OMNIPAQUE IOHEXOL 300 MG/ML  SOLN COMPARISON:  09/16/2017 abdominal ultrasound. FINDINGS: Lower chest: Mild interlobular septal thickening and ground-glass opacities in the lung bases compatible with interstitial pulmonary edema. Cardiomegaly.  Hepatobiliary: Mild enlargement of the left lobe of the liver suggestion of a lobulated contour may reflect underlying cirrhosis. No focal liver lesion. Cholelithiasis. No intra or extrahepatic biliary ductal dilatation. Recanalized umbilical vein and prominent omental vessels, probable portal caval collaterals. Pancreas: Unremarkable. No pancreatic ductal dilatation or surrounding inflammatory changes. Spleen: Normal in size without focal abnormality. Adrenals/Urinary Tract: Adrenal glands are unremarkable. Kidneys are normal, without renal calculi, focal lesion, or hydronephrosis. Bladder is unremarkable. Stomach/Bowel: Stomach is within normal limits. Appendix appears normal. No evidence of bowel wall thickening, distention, or inflammatory changes. Vascular/Lymphatic: Aortic atherosclerosis. No enlarged abdominal or pelvic lymph nodes. Reproductive: Status post hysterectomy. No adnexal masses. Other: Moderate volume of ascites. Diffuse subcutaneous edema. Musculoskeletal: No fracture is seen. L4-5 grade 1 anterolisthesis due to prominent facet arthropathy. IMPRESSION: 1. Findings compatible with cirrhosis including recanalized umbilical vein and omental collaterals. 2. Moderate volume of ascites.  Anasarca. 3. Mild interstitial pulmonary edema. Partially visualized cardiomegaly. 4.  Aortic Atherosclerosis (ICD10-I70.0). Electronically Signed   By: Kristine Garbe M.D.   On: 12/16/2018 02:52   US Abdomen Limited  Result Date: 12/18/2018 CLINICAL DATA:  73 year old female with cirrhosis and ascites. EXAM: LIMITED ABDOMEN ULTRASOUND FOR ASCITES TECHNIQUE: Limited ultrasound survey for ascites was performed in all four abdominal quadrants. COMPARISON:  None. FINDINGS: Sonographic interrogation of the 4 quadrants of the abdomen demonstrates small volume ascites. There was borderline sufficient fluid for paracentesis. The patient deferred intervention at this time. IMPRESSION: Relatively mild ascites.   Paracentesis was deferred. Electronically Signed   By: Jacqulynn Cadet M.D.   On: 12/18/2018 11:48   Dg Chest Port 1 View  Result Date: 01/01/2019 CLINICAL DATA:  Weakness EXAM: PORTABLE CHEST 1 VIEW COMPARISON:  12/15/2018 FINDINGS: Stable cardiomegaly and mediastinal contours. Interstitial coarsening similar to priors. Mild retrocardiac atelectasis or scarring, also seen on abdominal CT 12/16/2018. IMPRESSION: No acute finding when compared to priors. Electronically Signed   By: Monte Fantasia M.D.   On: 01/01/2019 07:46   US Abdomen Limited Ruq  Result Date: 12/16/2018 CLINICAL DATA:  Attic cirrhosis EXAM: ULTRASOUND ABDOMEN LIMITED RIGHT UPPER QUADRANT COMPARISON:  CT from earlier today FINDINGS: Gallbladder: Layering sludge. The gallbladder wall is thickened to 4 mm, likely reactive to the patient's cirrhosis. A small shadowing stone is seen towards the fundus. Common bile duct: Diameter: 5 mm.  Where visualized, no filling defect. Liver: Cirrhotic liver with patent and antegrade flow in the main portal vein. Moderate ascites with simple appearance. No focal liver finding. IMPRESSION: 1. Gallbladder sludge and small stone. There is gallbladder wall thickening that is likely reactive to the chronic liver disease as there is no sonographic Murphy sign. 2. Cirrhosis with moderate ascites. There  is antegrade flow in the main portal vein. Electronically Signed   By: Monte Fantasia M.D.   On: 12/16/2018 06:46     LOS: 1 day   Oren Binet, MD  [facility]  If 7PM-7AM, please contact night-coverage  Please page via [email or web address]-Password [email or web address]-click on MD name and type text message  01/02/2019, 11:35 AM

## 2019-01-02 NOTE — Evaluation (Signed)
Physical Therapy Evaluation Patient Details Name: Darlene Delacruz MRN: [id number] DOB: 1946/08/28 Today's Date: 01/02/2019   History of Present Illness  Patient is a 73 y/o female presenting to the ED on 01/01/19 due to primary complaints of weakness and inability to walk. Of note, recent hospital admission for fluid overload. Past medical history significant of hypertension, hyperlipidemia, thrombocytopenia, alcohol abuse, chronic liver disease. Admitted for Acute metabolic encephalopathy.    Clinical Impression  Patient admitted with the above listed diagnosis. Patient reports Mod I with mobility and ADLs prior to admission. Patient today requiring Min A/min guard throughout for all mobility, for safety and stability. Patient reporting limited caregiver support at home, with patient to benefit from 24/hr supervision continued therapies prior to return home as patient is at risk for falls. PT to currently recommend SNF, will continue to assess based on clinical progression.     Follow Up Recommendations SNF    Equipment Recommendations  Other (comment)(defer)    Recommendations for Other Services       Precautions / Restrictions Precautions Precautions: Fall Restrictions Weight Bearing Restrictions: No      Mobility  Bed Mobility Overal bed mobility: Needs Assistance Bed Mobility: Supine to Sit     Supine to sit: Min assist     General bed mobility comments: light MIn A to come to EOB with PT providing cueing for sequencing to complete task  Transfers Overall transfer level: Needs assistance Equipment used: Rolling walker (2 wheeled) Transfers: Sit to/from Stand Sit to Stand: Min assist;Min guard         General transfer comment: light min A to min guard with cueing for hand placement  Ambulation/Gait Ambulation/Gait assistance: Min guard Gait Distance (Feet): 15 Feet(2 reps) Assistive device: Rolling walker (2 wheeled) Gait Pattern/deviations: Step-through  pattern;Decreased stride length Gait velocity: decreased   General Gait Details: cueing for safety and sequencing with device as well as Estate agent    Modified Rankin (Stroke Patients Only)       Balance Overall balance assessment: Needs assistance Sitting-balance support: No upper extremity supported;Feet supported Sitting balance-Leahy Scale: Fair     Standing balance support: Bilateral upper extremity supported;During functional activity Standing balance-Leahy Scale: Fair Standing balance comment: use of RW for dynamic mobility; able to stand at sink to perform hand hygeine and to brush teeth                             Pertinent Vitals/Pain Pain Assessment: No/denies pain    Home Living Family/patient expects to be discharged to:: Private residence Living Arrangements: Alone Available Help at Discharge: Family;Available PRN/intermittently Type of Home: House Home Access: Stairs to enter(states she has a lift in the garage that she uses)     Home Layout: One level Home Equipment: Clinical cytogeneticist - 2 wheels;Cane - single point;Grab bars - toilet;Grab bars - tub/shower;Hand held shower head;Bedside commode      Prior Function Level of Independence: Needs assistance   Gait / Transfers Assistance Needed: intermittent use of RW - mainly at night and sometimes during the day  ADL's / Homemaking Assistance Needed: "sitter" to provide  supervision - for the last week; also assist with meal prep and laundry        Hand Dominance        Extremity/Trunk Assessment   Upper Extremity Assessment Upper Extremity Assessment:  Defer to OT evaluation    Lower Extremity Assessment Lower Extremity Assessment: Generalized weakness    Cervical / Trunk Assessment Cervical / Trunk Assessment: Normal  Communication   Communication: No difficulties  Cognition Arousal/Alertness: Awake/alert Behavior During  Therapy: WFL for tasks assessed/performed Overall Cognitive Status: Impaired/Different from baseline Area of Impairment: Safety/judgement;Problem solving                         Safety/Judgement: Decreased awareness of safety   Problem Solving: Slow processing;Difficulty sequencing;Requires verbal cues General Comments: patient with cell phone in lap, picking up nursing call bell asking if she needed to dial out. Reports she has no history ofyesterdays events      General Comments      Exercises     Assessment/Plan    PT Assessment Patient needs continued PT services  PT Problem List Decreased strength;Decreased activity tolerance;Decreased balance;Decreased mobility;Decreased knowledge of use of DME;Decreased safety awareness       PT Treatment Interventions DME instruction;Gait training;Functional mobility training;Therapeutic activities;Therapeutic exercise;Balance training;Patient/family education    PT Goals (Current goals can be found in the Care Plan section)  Acute Rehab PT Goals Patient Stated Goal: "I need to get stronger" PT Goal Formulation: With patient Time For Goal Achievement: 01/16/19 Potential to Achieve Goals: Good    Frequency Min 3X/week   Barriers to discharge Decreased caregiver support must be independent to go home - reports her family members can not stay with her    Co-evaluation PT/OT/SLP Co-Evaluation/Treatment: Yes Reason for Co-Treatment: Complexity of the patient's impairments (multi-system involvement);Necessary to address cognition/behavior during functional activity;For patient/therapist safety;To address functional/ADL transfers PT goals addressed during session: Mobility/safety with mobility;Balance;Proper use of DME         AM-PAC PT "6 Clicks" Mobility  Outcome Measure Help needed turning from your back to your side while in a flat bed without using bedrails?: A Little Help needed moving from lying on your back to  sitting on the side of a flat bed without using bedrails?: A Little Help needed moving to and from a bed to a chair (including a wheelchair)?: A Little Help needed standing up from a chair using your arms (e.g., wheelchair or bedside chair)?: A Little Help needed to walk in hospital room?: A Little Help needed climbing 3-5 steps with a railing? : A Little 6 Click Score: 18    End of Session Equipment Utilized During Treatment: Gait belt Activity Tolerance: Patient tolerated treatment well Patient left: in chair;with call bell/phone within reach;with chair alarm set Nurse Communication: Mobility status PT Visit Diagnosis: Unsteadiness on feet (R26.81);Other abnormalities of gait and mobility (R26.89);Muscle weakness (generalized) (M62.81)    Time: 6962-9528 PT Time Calculation (min) (ACUTE ONLY): 28 min   Charges:   PT Evaluation $PT Eval Moderate Complexity: 1 Mod         Lanney Gins, PT, DPT Supplemental Physical Therapist 01/02/19 10:30 AM Pager: [phone] Office: [phone]

## 2019-01-02 NOTE — Evaluation (Signed)
Occupational Therapy Evaluation Patient Details Name: Darlene Delacruz MRN: [id number] DOB: 04-02-1946 Today's Date: 01/02/2019    History of Present Illness Patient is a 73 y/o female presenting to the ED on 01/01/19 due to primary complaints of weakness and inability to walk. Of note, recent hospital admission for fluid overload. Past medical history significant of hypertension, hyperlipidemia, thrombocytopenia, alcohol abuse, chronic liver disease. Admitted for Acute metabolic encephalopathy.   Clinical Impression   Patient presenting with decreased I in self care, balance, functional mobility/transfers, endurance, strength, cognition, and safety awareness.  Patient reports living alone PTA. She had an aide provide supervision for self care and assist with IADLs. Pt unable to give OT information regarding frequency. Patient currently functioning at min A with RW and min - mod A for self care. Patient will benefit from acute OT to increase overall independence in the areas of ADLs, functional mobility, and safety awareness in order to safely discharge to next venue of care. She does not have 24/7 supervision at home.     Follow Up Recommendations  SNF    Equipment Recommendations  Other (comment)(defer to next venue of care)    Recommendations for Other Services Other (comment)(none recommended)     Precautions / Restrictions Precautions Precautions: Fall Precaution Comments: Fall risk greatly reduced withuse of RW Restrictions Weight Bearing Restrictions: No      Mobility Bed Mobility Overal bed mobility: Needs Assistance Bed Mobility: Supine to Sit     Supine to sit: Min assist     General bed mobility comments: light MIn A to come to EOB with PT providing cueing for sequencing to complete task  Transfers Overall transfer level: Needs assistance Equipment used: Rolling walker (2 wheeled) Transfers: Sit to/from Stand Sit to Stand: Min assist;Min guard         General  transfer comment: light min A to min guard with cueing for hand placement    Balance Overall balance assessment: Needs assistance Sitting-balance support: No upper extremity supported;Feet supported Sitting balance-Leahy Scale: Fair     Standing balance support: Bilateral upper extremity supported;During functional activity Standing balance-Leahy Scale: Fair Standing balance comment: use of RW for dynamic mobility; able to stand at sink to perform hand hygeine and to brush teeth        ADL either performed or assessed with clinical judgement   ADL Overall ADL's : Needs assistance/impaired Eating/Feeding: Set up;Sitting   Grooming: Wash/dry hands;Oral care;Standing;Min guard   Upper Body Bathing: Sitting;Set up   Lower Body Bathing: Minimal assistance;Sit to/from stand   Upper Body Dressing : Minimal assistance;Sitting   Lower Body Dressing: Sit to/from stand;Minimal assistance   Toilet Transfer: Minimal assistance   Toileting- Clothing Manipulation and Hygiene: Min guard       Functional mobility during ADLs: Min guard;Rolling walker;Minimal assistance       Vision Baseline Vision/History: Wears glasses Wears Glasses: At all times(but doesnt have at hospital) Patient Visual Report: No change from baseline Vision Assessment?: No apparent visual deficits            Pertinent Vitals/Pain Pain Assessment: No/denies pain     Hand Dominance Right   Extremity/Trunk Assessment Upper Extremity Assessment Upper Extremity Assessment: Generalized weakness   Lower Extremity Assessment Lower Extremity Assessment: Generalized weakness   Cervical / Trunk Assessment Cervical / Trunk Assessment: Normal   Communication Communication Communication: No difficulties   Cognition Arousal/Alertness: Awake/alert Behavior During Therapy: WFL for tasks assessed/performed Overall Cognitive Status: Impaired/Different from baseline Area of Impairment:  Safety/judgement;Problem  solving;Memory;Orientation                 Orientation Level: Time;Disoriented to   Memory: Decreased short-term memory   Safety/Judgement: Decreased awareness of safety   Problem Solving: Slow processing;Difficulty sequencing;Requires verbal cues General Comments: patient with cell phone in lap, picking up nursing call bell asking if she needed to dial out. Reports she has no history ofyesterdays events              Home Living Family/patient expects to be discharged to:: Private residence Living Arrangements: Alone Available Help at Discharge: Family;Available PRN/intermittently Type of Home: House Home Access: Stairs to enter [employer] of Steps: lift in garage to get into house   Home Layout: One level     Bathroom Shower/Tub: Occupational psychologist: Handicapped height Bathroom Accessibility: Yes How Accessible: Accessible via walker Home Equipment: Clinical cytogeneticist - 2 wheels;Cane - single point;Grab bars - toilet;Grab bars - tub/shower;Hand held shower head;Bedside commode          Prior Functioning/Environment Level of Independence: Needs assistance  Gait / Transfers Assistance Needed: intermittent use of RW - mainly at night and sometimes during the day ADL's / Homemaking Assistance Needed: "sitter" to provide  supervision - for the last week; also assist with meal prep and laundry            OT Problem List: Decreased strength;Impaired balance (sitting and/or standing);Decreased cognition;Decreased safety awareness;Decreased activity tolerance;Decreased knowledge of use of DME or AE      OT Treatment/Interventions: Self-care/ADL training;Manual therapy;Therapeutic exercise;Patient/family education;Modalities;Neuromuscular education;Balance training;Energy conservation;Therapeutic activities;Cognitive remediation/compensation;DME and/or AE instruction    OT Goals(Current goals can be found in the care plan section) Acute Rehab  OT Goals Patient Stated Goal: "I need to get stronger" OT Goal Formulation: With patient Time For Goal Achievement: 01/16/19 Potential to Achieve Goals: Good ADL Goals Pt Will Perform Grooming: with modified independence Pt Will Perform Upper Body Bathing: with modified independence Pt Will Perform Lower Body Bathing: with modified independence Pt Will Perform Upper Body Dressing: with modified independence Pt Will Perform Lower Body Dressing: with modified independence Pt Will Transfer to Toilet: with modified independence Pt Will Perform Toileting - Clothing Manipulation and hygiene: with modified independence  OT Frequency: Min 2X/week   Barriers to D/C: Decreased caregiver support  lives alone. Had aide to assist with self care and IADLs before. Family works during the day.       Co-evaluation PT/OT/SLP Co-Evaluation/Treatment: Yes Reason for Co-Treatment: Complexity of the patient's impairments (multi-system involvement);Necessary to address cognition/behavior during functional activity;For patient/therapist safety;To address functional/ADL transfers PT goals addressed during session: Mobility/safety with mobility;Balance;Proper use of DME OT goals addressed during session: ADL's and self-care;Proper use of Adaptive equipment and DME      AM-PAC OT "6 Clicks" Daily Activity     Outcome Measure Help from another person eating meals?: None Help from another person taking care of personal grooming?: A Little Help from another person toileting, which includes using toliet, bedpan, or urinal?: A Little Help from another person bathing (including washing, rinsing, drying)?: A Little Help from another person to put on and taking off regular upper body clothing?: A Little Help from another person to put on and taking off regular lower body clothing?: A Lot 6 Click Score: 18   End of Session Equipment Utilized During Treatment: Gait belt;Rolling walker Nurse Communication: Mobility  status  Activity Tolerance: Patient tolerated treatment well Patient left: in chair;with call  bell/phone within reach;with chair alarm set  OT Visit Diagnosis: Unsteadiness on feet (R26.81);Muscle weakness (generalized) (M62.81)                Time: 5400-8676 OT Time Calculation (min): 28 min Charges:  OT General Charges $OT Visit: 1 Visit OT Evaluation $OT Eval Low Complexity: 1 Low  [place] Marchiano P, MS, OTR/L 01/02/2019, 11:38 AM

## 2019-01-02 NOTE — Clinical Social Work Note (Signed)
Clinical Social Work Assessment  Patient Details  Name: Darlene Delacruz MRN: [id number] Date of Birth: 14-Apr-1946  Date of referral:  01/02/19               Reason for consult:  Facility Placement                Permission sought to share information with:  Facility Art therapist granted to share information::  Yes, Verbal Permission Granted  Name::        Agency::  SNF, [facility]  Relationship::     Contact Information:     Housing/Transportation Living arrangements for the past 2 months:  Single Family Home Source of Information:  Patient, Medical Team Patient Interpreter Needed:  None Criminal Activity/Legal Involvement Pertinent to Current Situation/Hospitalization:  No - Comment as needed Significant Relationships:  Adult Children, Other Family Members Lives with:  Self, Other (Comment) Do you feel safe going back to the place where you live?  Yes Need for family participation in patient care:  No (Coment)  Care giving concerns:  Patient from home, previously [facility] up with [facility] where aids come to the home. Patient would prefer to go home. MD is concerned about patient returning home given her readmission within 2 weeks. Patient to discuss with her family before fully deciding, but will consider SNF.    Social Worker assessment / plan:  CSW met with patient to discuss discharge. Patient reported that she wanted to go home, had aide services set up with family support for mostly 24 hour supervision. CSW provided report to [facility]. RNCM later discussed with CSW that MD is concerned about patient returning home, had discussed with patient and she is agreeable to thinking about SNF and talking with her family. Patient agreeable to fax out referral in [place].   Employment status:  Retired Forensic scientist:  Medicare PT Recommendations:  [place] / Referral to community resources:  [place]  Patient/Family's Response to care:  Patient agreeable to discuss with her family and think about SNF, but would really rather go home at this time.  Patient/Family's Understanding of and Emotional Response to Diagnosis, Current Treatment, and Prognosis:  Patient aware that she wasn't doing what she was supposed to do, she wasn't eating enough or drinking enough fluids. Patient says she has support at home with [facility], and would really rather be home as she's been at SNF before and didn't enjoy the experience. Patient aware that she needs help, but says she gets the support that she needs from the aides. Patient discussing with her family overnight and will make a final decision tomorrow.  Emotional Assessment Appearance:  Appears stated age Attitude/Demeanor/Rapport:  Engaged Affect (typically observed):  Pleasant Orientation:  Oriented to Self, Oriented to Place, Oriented to Situation, Oriented to  Time Alcohol / Substance use:  Not Applicable Psych involvement (Current and /or in the community):  No (Comment)  Discharge Needs  Concerns to be addressed:  Care Coordination Readmission within the last 30 days:  No Current discharge risk:  Physical Impairment, Dependent with Mobility Barriers to Discharge:  Continued Medical Work up   [employer], [place] 01/02/2019, 4:13 PM

## 2019-01-03 LAB — BASIC METABOLIC PANEL
Anion gap: 10 (ref 5–15)
BUN: 11 mg/dL (ref 8–23)
CO2: 21 mmol/L — ABNORMAL LOW (ref 22–32)
Calcium: 8.4 mg/dL — ABNORMAL LOW (ref 8.9–10.3)
Chloride: 104 mmol/L (ref 98–111)
Creatinine, Ser: 0.71 mg/dL (ref 0.44–1.00)
GFR calc Af Amer: >60 mL/min (ref >60)
GFR calc non Af Amer: >60 mL/min (ref >60)
Glucose, Bld: 96 mg/dL (ref 70–99)
Potassium: 3.6 mmol/L (ref 3.5–5.1)
Sodium: 135 mmol/L (ref 135–145)

## 2019-01-03 LAB — URINE CULTURE: Culture: 50000 — AB

## 2019-01-03 LAB — AMMONIA: Ammonia: 60 umol/L — ABNORMAL HIGH (ref 9–35)

## 2019-01-03 MED ORDER — LACTULOSE 10 GM/15ML PO SOLN
30.0000 g | Freq: Three times a day (TID) | ORAL | Status: DC
  Administered 2019-01-03 – 2019-01-04 (×4): 30 g via ORAL
  Filled 2019-01-03 (×4): qty 60

## 2019-01-03 MED ORDER — BISACODYL 10 MG RE SUPP
10.0000 mg | Freq: Every day | RECTAL | Status: DC | PRN
  Administered 2019-01-03: 10 mg via RECTAL
  Filled 2019-01-03: qty 1

## 2019-01-03 NOTE — Progress Notes (Signed)
Occupational Therapy Treatment Patient Details Name: Darlene Delacruz MRN: [id number] DOB: June 27, 1946 Today's Date: 01/03/2019    History of present illness Patient is a 73 y/o female presenting to the ED on 01/01/19 due to primary complaints of weakness and inability to walk. Of note, recent hospital admission for fluid overload. Past medical history significant of hypertension, hyperlipidemia, thrombocytopenia, alcohol abuse, chronic liver disease. Admitted for Acute metabolic encephalopathy.   OT comments  Pt agreeable to OT intervention this session with no c/o pain but reports fatigue throughout session. Pt performing bed mobility with supervision and initially needing min guard for balance with use of RW but progressing to supervision overall while standing at sink for ADL tasks. Pt requesting to ambulate into hallway 100' with RW and close supervision. Pt having inceasing difficulty with navigation and needing mod cuing to locate room. Pt seated in recliner chair with chair alarm activated and call bell within reach.   Follow Up Recommendations  SNF    Equipment Recommendations  Other (comment)(defer to next venue of care)    Recommendations for Other Services      Precautions / Restrictions Precautions Precautions: Fall Restrictions Weight Bearing Restrictions: No       Mobility Bed Mobility Overal bed mobility: Needs Assistance Bed Mobility: Supine to Sit     Supine to sit: Supervision     General bed mobility comments: min cuing for hand placement and technique  Transfers Overall transfer level: Needs assistance Equipment used: Rolling walker (2 wheeled) Transfers: Sit to/from Stand Sit to Stand: Min guard              Balance Overall balance assessment: Needs assistance Sitting-balance support: No upper extremity supported;Feet supported Sitting balance-Leahy Scale: Fair     Standing balance support: Bilateral upper extremity supported;During functional  activity Standing balance-Leahy Scale: Fair Standing balance comment: use of RW for dynamic mobility but able to progress to supervision as session continued          ADL either performed or assessed with clinical judgement   ADL       Grooming: Oral care;Standing;Min guard;Wash/dry hands     Functional mobility during ADLs: Min guard;Supervision/safety;Rolling walker General ADL Comments: min guard progressing to supervision with balance with use of RW this session     Vision Baseline Vision/History: Wears glasses Wears Glasses: At all times Patient Visual Report: No change from baseline            Cognition Arousal/Alertness: Awake/alert Behavior During Therapy: WFL for tasks assessed/performed Overall Cognitive Status: Impaired/Different from baseline Area of Impairment: Safety/judgement;Problem solving;Memory                     Memory: Decreased short-term memory   Safety/Judgement: Decreased awareness of safety   Problem Solving: Slow processing;Difficulty sequencing;Requires verbal cues                     Pertinent Vitals/ Pain       Pain Assessment: No/denies pain         Frequency  Min 2X/week        Progress Toward Goals  OT Goals(current goals can now be found in the care plan section)  Progress towards OT goals: Progressing toward goals  Acute Rehab OT Goals Patient Stated Goal: "I need to get stronger" OT Goal Formulation: With patient Time For Goal Achievement: 01/17/19 Potential to Achieve Goals: Good  Plan Discharge plan remains appropriate    Co-evaluation  PT/OT/SLP Co-Evaluation/Treatment: Yes            AM-PAC OT "6 Clicks" Daily Activity     Outcome Measure   Help from another person eating meals?: None Help from another person taking care of personal grooming?: A Little Help from another person toileting, which includes using toliet, bedpan, or urinal?: A Little Help from another person bathing  (including washing, rinsing, drying)?: A Little Help from another person to put on and taking off regular upper body clothing?: A Little Help from another person to put on and taking off regular lower body clothing?: A Little 6 Click Score: 19    End of Session Equipment Utilized During Treatment: Rolling walker  OT Visit Diagnosis: Unsteadiness on feet (R26.81);Muscle weakness (generalized) (M62.81)   Activity Tolerance Patient tolerated treatment well   Patient Left with call bell/phone within reach;with chair alarm set;in chair   Nurse Communication Mobility status        Time: 2707-8675 OT Time Calculation (min): 20 min  Charges: OT Treatments $Self Care/Home Management : 8-22 mins    Soul Hackman P, MS, OTR/L 01/03/2019, 9:59 AM

## 2019-01-03 NOTE — Progress Notes (Signed)
PROGRESS NOTE        PATIENT DETAILS Name: Darlene Delacruz Age: 73 y.o. Sex: female Date of Birth: 02-Apr-1946 Admit Date: 01/01/2019 Admitting Physician Barb Merino, MD [id number], Hinton Dyer, DO  Brief Narrative: Patient is a 73 y.o. female with prior history of alcoholic liver cirrhosis-recent hospitalization (discharged on 12/22/2018) after being diuresed (negative balance of 7.8 L)-presented with AKI, and weakness.  Thought to have dehydration in the setting of poor oral intake and ongoing diuretic use.  See below for further details  Subjective: Improvement continues-she is less fatigued than how she came in with-she is awake and alert.  She has agreed to go to SNF  Assessment/Plan: Acute metabolic encephalopathy: Secondary to dehydration and AKI.  Resolved-she is awake and alert this morning.   AKI: Likely hemodynamically mediated-in the setting of poor oral intake and diuretic use.  Her exam is stable-she is euvolemic-AKI is resolved.  IV fluid has been discontinued-she has been started on her usual diuretic regimen but dosage has been decreased-to 50 mg of Aldactone and 20 mg of Lasix.  Reassess volume status accordingly.  Hypertension: Controlled with diuretics.  History of alcoholic liver cirrhosis: Seems compensated-with stable volume status-she is alert and awake.  Has not had a bowel movement for 2 days-increase lactulose to 30 g 3 times daily, continue diuretics.  Follow with primary gastroenterologist on discharge.  Acute debility/deconditioning: Secondary to AKI/encephalopathy-evaluated by PT-reluctantly agrees to go to SNF.  Social worker following.    Thrombocytopenia: Appears to be mild-likely secondary to hypersplenism in the setting of liver cirrhosis.  Stable for periodic monitoring.  Asymptomatic bacteriuria: During her most recent hospitalization-she was treated for Enterococcus faecalis.  Urine cultures on 1/26 is still positive for  enterococcus-she has no symptoms of UTI-this likely represents a colonization rather than infection.  DVT Prophylaxis: SCD's  Code Status: Full code  Family Communication: None at bedside  Disposition Plan: Remain inpatient-SNF on discharge-likely on 1/29  Antimicrobial agents: Anti-infectives (From admission, onward)   None      Procedures: None  CONSULTS:  None  Time spent: 25- minutes-Greater than 50% of this time was spent in counseling, explanation of diagnosis, planning of further management, and coordination of care.  MEDICATIONS: Scheduled Meds: . aspirin EC  81 mg Oral Daily  . furosemide  20 mg Oral Daily  . lactulose  30 g Oral TID  . magnesium oxide  400 mg Oral Daily  . pantoprazole  40 mg Oral BID AC  . spironolactone  50 mg Oral Daily   Continuous Infusions: . lactated ringers 10 mL/hr at 01/02/19 1630   PRN Meds:.acetaminophen **OR** acetaminophen, bisacodyl, ondansetron **OR** ondansetron (ZOFRAN) IV   PHYSICAL EXAM: Vital signs: Vitals:   01/02/19 1549 01/02/19 1933 01/03/19 0012 01/03/19 0338  BP: 135/67 128/69 138/62 137/68  Pulse: 70 76 76 78  Resp: 16 18 18 18   Temp: 98.1 F (36.7 C) 98.1 F (36.7 C) 97.7 F (36.5 C) 98.5 F (36.9 C)  TempSrc: Oral Oral Oral Oral  SpO2: 92% 96% 93% 95%  Weight:      Height:       Filed Weights   01/02/19 0059  Weight: 60.4 kg   Body mass index is 21.49 kg/m.   General appearance:Awake, alert, not in any distress.  Eyes:no scleral icterus. HEENT: Atraumatic  and Normocephalic Neck: supple, no JVD. Resp:Good air entry bilaterally,no rales or rhonchi CVS: S1 S2 regular, no murmurs.  GI: Bowel sounds present, Non tender and not distended with no gaurding, rigidity or rebound. Extremities: B/L Lower Ext shows no edema, both legs are warm to touch Neurology:  Non focal Psychiatric: Normal judgment and insight. Normal mood. Musculoskeletal:No digital cyanosis Skin:No Rash, warm and  dry Wounds:N/A  I have personally reviewed following labs and imaging studies  LABORATORY DATA: CBC: Recent Labs  Lab 01/01/19 0720 01/02/19 0756  WBC 6.4 5.8  NEUTROABS 4.1  --   HGB 12.8 11.7*  HCT 37.6 34.6*  MCV 98.2 99.1  PLT 166 124*    Basic Metabolic Panel: Recent Labs  Lab 01/01/19 0720 01/02/19 0756 01/03/19 0454  NA 133* 135 135  K 4.9 4.0 3.6  CL 98 108 104  CO2 22 20* 21*  GLUCOSE 113* 92 96  BUN 31* 16 11  CREATININE 1.41* 0.76 0.71  CALCIUM 9.5 8.6* 8.4*  MG 2.6*  --   --   PHOS 4.5  --   --     GFR: Estimated Creatinine Clearance: 59.5 mL/min (by C-G formula based on SCr of 0.71 mg/dL).  Liver Function Tests: Recent Labs  Lab 01/01/19 0720 01/02/19 0756  AST 40 44*  ALT 40 36  ALKPHOS 96 86  BILITOT 3.8* 3.4*  PROT 8.0 6.5  ALBUMIN 3.3* 2.6*   Recent Labs  Lab 01/01/19 0720  LIPASE 65*   Recent Labs  Lab 01/01/19 0938 01/02/19 0756 01/03/19 0454  AMMONIA 71* 55* 60*    Coagulation Profile: Recent Labs  Lab 01/02/19 0756  INR 1.41    Cardiac Enzymes: No results for input(s): CKTOTAL, CKMB, CKMBINDEX, TROPONINI in the last 168 hours.  BNP (last 3 results) No results for input(s): PROBNP in the last 8760 hours.  HbA1C: No results for input(s): HGBA1C in the last 72 hours.  CBG: No results for input(s): GLUCAP in the last 168 hours.  Lipid Profile: No results for input(s): CHOL, HDL, LDLCALC, TRIG, CHOLHDL, LDLDIRECT in the last 72 hours.  Thyroid Function Tests: No results for input(s): TSH, T4TOTAL, FREET4, T3FREE, THYROIDAB in the last 72 hours.  Anemia Panel: No results for input(s): VITAMINB12, FOLATE, FERRITIN, TIBC, IRON, RETICCTPCT in the last 72 hours.  Urine analysis:    Component Value Date/Time   COLORURINE YELLOW 01/01/2019 [place] 01/01/2019 0942   LABSPEC 1.014 01/01/2019 0942   PHURINE 8.0 01/01/2019 0942   GLUCOSEU NEGATIVE 01/01/2019 0942   HGBUR NEGATIVE 01/01/2019  0942   BILIRUBINUR NEGATIVE 01/01/2019 0942   BILIRUBINUR 2 03/06/2011 1505   KETONESUR NEGATIVE 01/01/2019 0942   PROTEINUR NEGATIVE 01/01/2019 0942   UROBILINOGEN neg 03/06/2011 1505   NITRITE NEGATIVE 01/01/2019 0942   LEUKOCYTESUR NEGATIVE 01/01/2019 0942    Sepsis Labs: Lactic Acid, Venous    Component Value Date/Time   LATICACIDVEN 1.7 01/01/2019 0998    MICROBIOLOGY: Recent Results (from the past 240 hour(s))  Blood Culture (routine x 2)     Status: None (Preliminary result)   Collection Time: 01/01/19  7:32 AM  Result Value Ref Range Status   Specimen Description BLOOD LEFT ANTECUBITAL  Final   Special Requests   Final    BOTTLES DRAWN AEROBIC AND ANAEROBIC Blood Culture results may not be optimal due to an excessive volume of blood received in culture bottles   Culture   Final    NO GROWTH 1 DAY  Performed at [facility], [place] [address] [place], [place] [address]    Report Status PENDING  Incomplete  Blood Culture (routine x 2)     Status: None (Preliminary result)   Collection Time: 01/01/19  7:34 AM  Result Value Ref Range Status   Specimen Description BLOOD RIGHT ANTECUBITAL  Final   Special Requests   Final    BOTTLES DRAWN AEROBIC AND ANAEROBIC Blood Culture results may not be optimal due to an inadequate volume of blood received in culture bottles   Culture   Final    NO GROWTH 1 DAY Performed at [facility], [place] [address] [place], [place] [address]    Report Status PENDING  Incomplete  Urine culture     Status: Abnormal   Collection Time: 01/01/19  9:42 AM  Result Value Ref Range Status   Specimen Description URINE, RANDOM  Final   Special Requests   Final    NONE Performed at [facility], [place] [address], [place], [place] [address]    Culture 50,000 COLONIES/mL ENTEROCOCCUS FAECALIS (A)  Final   Report Status 01/03/2019 FINAL  Final   Organism ID, Bacteria ENTEROCOCCUS FAECALIS (A)  Final      Susceptibility   Enterococcus  faecalis - MIC*    AMPICILLIN <=2 SENSITIVE Sensitive     LEVOFLOXACIN 1 SENSITIVE Sensitive     NITROFURANTOIN <=16 SENSITIVE Sensitive     VANCOMYCIN 1 SENSITIVE Sensitive     * 50,000 COLONIES/mL ENTEROCOCCUS FAECALIS    RADIOLOGY STUDIES/RESULTS: Dg Chest 2 View  Result Date: 12/15/2018 CLINICAL DATA:  Shortness of breath and fluid retention for 1 day. Recent hospitalization for sepsis and pneumonia. Former smoker. EXAM: CHEST - 2 VIEW COMPARISON:  01/09/2018 FINDINGS: Mild cardiac enlargement. No vascular congestion. Mild diffuse interstitial prominence to the lungs, likely representing early edema. Interstitial pneumonitis also possible. No blunting of costophrenic angles. No pneumothorax. No focal consolidation. Calcification of the aorta. Degenerative changes in the spine. IMPRESSION: Mild cardiac enlargement. Mild diffuse interstitial pattern to the lungs may represent early edema or interstitial pneumonitis. Electronically Signed   By: Lucienne Capers M.D.   On: 12/15/2018 22:34   Ct Abdomen Pelvis W Contrast  Result Date: 12/16/2018 CLINICAL DATA:  73 y/o  F; weight gain and shortness of breath. EXAM: CT ABDOMEN AND PELVIS WITH CONTRAST TECHNIQUE: Multidetector CT imaging of the abdomen and pelvis was performed using the standard protocol following bolus administration of intravenous contrast. CONTRAST:  169mL OMNIPAQUE IOHEXOL 300 MG/ML  SOLN COMPARISON:  09/16/2017 abdominal ultrasound. FINDINGS: Lower chest: Mild interlobular septal thickening and ground-glass opacities in the lung bases compatible with interstitial pulmonary edema. Cardiomegaly. Hepatobiliary: Mild enlargement of the left lobe of the liver suggestion of a lobulated contour may reflect underlying cirrhosis. No focal liver lesion. Cholelithiasis. No intra or extrahepatic biliary ductal dilatation. Recanalized umbilical vein and prominent omental vessels, probable portal caval collaterals. Pancreas: Unremarkable. No  pancreatic ductal dilatation or surrounding inflammatory changes. Spleen: Normal in size without focal abnormality. Adrenals/Urinary Tract: Adrenal glands are unremarkable. Kidneys are normal, without renal calculi, focal lesion, or hydronephrosis. Bladder is unremarkable. Stomach/Bowel: Stomach is within normal limits. Appendix appears normal. No evidence of bowel wall thickening, distention, or inflammatory changes. Vascular/Lymphatic: Aortic atherosclerosis. No enlarged abdominal or pelvic lymph nodes. Reproductive: Status post hysterectomy. No adnexal masses. Other: Moderate volume of ascites. Diffuse subcutaneous edema. Musculoskeletal: No fracture is seen. L4-5 grade 1 anterolisthesis due to prominent facet arthropathy. IMPRESSION:  1. Findings compatible with cirrhosis including recanalized umbilical vein and omental collaterals. 2. Moderate volume of ascites.  Anasarca. 3. Mild interstitial pulmonary edema. Partially visualized cardiomegaly. 4.  Aortic Atherosclerosis (ICD10-I70.0). Electronically Signed   By: Kristine Garbe M.D.   On: 12/16/2018 02:52   US Abdomen Limited  Result Date: 12/18/2018 CLINICAL DATA:  73 year old female with cirrhosis and ascites. EXAM: LIMITED ABDOMEN ULTRASOUND FOR ASCITES TECHNIQUE: Limited ultrasound survey for ascites was performed in all four abdominal quadrants. COMPARISON:  None. FINDINGS: Sonographic interrogation of the 4 quadrants of the abdomen demonstrates small volume ascites. There was borderline sufficient fluid for paracentesis. The patient deferred intervention at this time. IMPRESSION: Relatively mild ascites.  Paracentesis was deferred. Electronically Signed   By: Jacqulynn Cadet M.D.   On: 12/18/2018 11:48   Dg Chest Port 1 View  Result Date: 01/01/2019 CLINICAL DATA:  Weakness EXAM: PORTABLE CHEST 1 VIEW COMPARISON:  12/15/2018 FINDINGS: Stable cardiomegaly and mediastinal contours. Interstitial coarsening similar to priors. Mild  retrocardiac atelectasis or scarring, also seen on abdominal CT 12/16/2018. IMPRESSION: No acute finding when compared to priors. Electronically Signed   By: Monte Fantasia M.D.   On: 01/01/2019 07:46   US Abdomen Limited Ruq  Result Date: 12/16/2018 CLINICAL DATA:  Attic cirrhosis EXAM: ULTRASOUND ABDOMEN LIMITED RIGHT UPPER QUADRANT COMPARISON:  CT from earlier today FINDINGS: Gallbladder: Layering sludge. The gallbladder wall is thickened to 4 mm, likely reactive to the patient's cirrhosis. A small shadowing stone is seen towards the fundus. Common bile duct: Diameter: 5 mm.  Where visualized, no filling defect. Liver: Cirrhotic liver with patent and antegrade flow in the main portal vein. Moderate ascites with simple appearance. No focal liver finding. IMPRESSION: 1. Gallbladder sludge and small stone. There is gallbladder wall thickening that is likely reactive to the chronic liver disease as there is no sonographic Murphy sign. 2. Cirrhosis with moderate ascites. There is antegrade flow in the main portal vein. Electronically Signed   By: Monte Fantasia M.D.   On: 12/16/2018 06:46     LOS: 2 days   Oren Binet, MD  [facility]  If 7PM-7AM, please contact night-coverage  Please page via [email or web address] [email or web address]-click on MD name and type text message  01/03/2019, 9:49 AM

## 2019-01-04 LAB — BASIC METABOLIC PANEL
Anion gap: 7 (ref 5–15)
BUN: 9 mg/dL (ref 8–23)
CO2: 21 mmol/L — ABNORMAL LOW (ref 22–32)
Calcium: 8.3 mg/dL — ABNORMAL LOW (ref 8.9–10.3)
Chloride: 107 mmol/L (ref 98–111)
Creatinine, Ser: 0.59 mg/dL (ref 0.44–1.00)
GFR calc Af Amer: >60 mL/min (ref >60)
GFR calc non Af Amer: >60 mL/min (ref >60)
Glucose, Bld: 81 mg/dL (ref 70–99)
Potassium: 3.6 mmol/L (ref 3.5–5.1)
Sodium: 135 mmol/L (ref 135–145)

## 2019-01-04 MED ORDER — LACTULOSE 10 GM/15ML PO SOLN: 30.0000 g | Freq: Three times a day (TID) | ORAL | 0 refills | Status: AC

## 2019-01-04 MED ORDER — FUROSEMIDE 20 MG PO TABS: 20.0000 mg | ORAL_TABLET | Freq: Every day | ORAL | 0 refills | Status: DC

## 2019-01-04 MED ORDER — SPIRONOLACTONE 50 MG PO TABS: 50.0000 mg | ORAL_TABLET | Freq: Every day | ORAL | 0 refills | Status: AC

## 2019-01-04 NOTE — Clinical Social Work Placement (Signed)
Nurse to call report to [phone]      CLINICAL SOCIAL WORK PLACEMENT  NOTE  Date:  01/04/2019  Patient Details  Name: Darlene Delacruz MRN: [id number] Date of Birth: 1946-06-30  Clinical Social Work is seeking post-discharge placement for this patient at the [place] level of care (*CSW will initial, date and re-position this form in  chart as items are completed):  Yes   Patient/family provided with [facility] Work Department's list of facilities offering this level of care within the geographic area requested by the patient (or if unable, by the patient's family).  Yes   Patient/family informed of their freedom to choose among providers that offer the needed level of care, that participate in Medicare, Medicaid or managed care program needed by the patient, have an available bed and are willing to accept the patient.  Yes   Patient/family informed of [facility]'s ownership interest in [facility] and [facility], as well as of the fact that they are under no obligation to receive care at these facilities.  PASRR submitted to EDS on       PASRR number received on       Existing PASRR number confirmed on       FL2 transmitted to all facilities in geographic area requested by pt/family on       FL2 transmitted to all facilities within larger geographic area on       Patient informed that his/her managed care company has contracts with or will negotiate with certain facilities, including the following:        Yes   Patient/family informed of bed offers received.  Patient chooses bed at [place] [place]     Physician recommends and patient chooses bed at      Patient to be transferred to [facility] on 01/04/19.  Patient to be transferred to facility by Family car     Patient family notified on 01/04/19 of transfer.  Name of family member notified:  Self     PHYSICIAN       Additional Comment:     _______________________________________________ Geralynn Ochs, LCSW 01/04/2019, 11:40 AM

## 2019-01-04 NOTE — Progress Notes (Signed)
Physical Therapy Treatment Patient Details Name: Darlene Delacruz MRN: [id number] DOB: 1946-01-16 Today's Date: 01/04/2019    History of Present Illness Patient is a 73 y/o female presenting to the ED on 01/01/19 due to primary complaints of weakness and inability to walk. Of note, recent hospital admission for fluid overload. Past medical history significant of hypertension, hyperlipidemia, thrombocytopenia, alcohol abuse, chronic liver disease. Admitted for Acute metabolic encephalopathy.    PT Comments    Pt performed gait training and functional mobility during session this am.  She required min guard overall.  Pt performed LE strengthening after gait training to improve strength and function.  Pt at baseline is not reliant on RW for support.  Based on her weakness she continues to benefit from skilled rehab at SNF to improve strength and function before returning home alone.    Follow Up Recommendations  SNF     Equipment Recommendations  (TBD at next venue)    Recommendations for Other Services       Precautions / Restrictions Precautions Precautions: Fall Precaution Comments: Fall risk greatly reduced withuse of RW Restrictions Weight Bearing Restrictions: No    Mobility  Bed Mobility Overal bed mobility: Needs Assistance Bed Mobility: Supine to Sit     Supine to sit: Supervision     General bed mobility comments: Cues for sequencing and hand placement, HOB elevated with use of rails.    Transfers Overall transfer level: Needs assistance Equipment used: Rolling walker (2 wheeled) Transfers: Sit to/from Stand Sit to Stand: Min guard         General transfer comment: Min guard with cues for hand placement to and from seated surface.    Ambulation/Gait Ambulation/Gait assistance: Min guard Gait Distance (Feet): 150 Feet Assistive device: Rolling walker (2 wheeled) Gait Pattern/deviations: Step-through pattern;Decreased stride length Gait velocity: decreased    General Gait Details: Cues for upper trunk control and RW safety.  Cues to step closer to device.     Stairs             Wheelchair Mobility    Modified Rankin (Stroke Patients Only)       Balance Overall balance assessment: Needs assistance   Sitting balance-Leahy Scale: Fair       Standing balance-Leahy Scale: Fair                              Cognition Arousal/Alertness: Awake/alert Behavior During Therapy: WFL for tasks assessed/performed Overall Cognitive Status: Within Functional Limits for tasks assessed                                 General Comments: Not formally assessed but appears to be within normal limits, following commands appropriately and aware she is discharging to rehab to get stronger.       Exercises General Exercises - Lower Extremity Ankle Circles/Pumps: AROM;Both;10 reps;Supine Quad Sets: AROM;Both;Supine;10 reps Long Arc Quad: AROM;Both;10 reps;Seated Heel Slides: AROM;10 reps;Both;Supine Hip ABduction/ADduction: AROM;Both;10 reps;Supine Straight Leg Raises: AROM;Both;Supine;10 reps Hip Flexion/Marching: AROM;Both;Seated;10 reps    General Comments        Pertinent Vitals/Pain Pain Assessment: Faces Faces Pain Scale: Hurts a little bit Pain Location: bottom Pain Descriptors / Indicators: Sore Pain Intervention(s): Repositioned    Home Living                      Prior  Function            PT Goals (current goals can now be found in the care plan section) Acute Rehab PT Goals Patient Stated Goal: "I need to get stronger" Potential to Achieve Goals: Good Progress towards PT goals: Progressing toward goals    Frequency    Min 3X/week      PT Plan Current plan remains appropriate    Co-evaluation              AM-PAC PT "6 Clicks" Mobility   Outcome Measure  Help needed turning from your back to your side while in a flat bed without using bedrails?: A Little Help  needed moving from lying on your back to sitting on the side of a flat bed without using bedrails?: A Little Help needed moving to and from a bed to a chair (including a wheelchair)?: A Little Help needed standing up from a chair using your arms (e.g., wheelchair or bedside chair)?: A Little Help needed to walk in hospital room?: A Little Help needed climbing 3-5 steps with a railing? : A Little 6 Click Score: 18    End of Session Equipment Utilized During Treatment: Gait belt Activity Tolerance: Patient tolerated treatment well Patient left: in chair;with call bell/phone within reach;with chair alarm set Nurse Communication: Mobility status PT Visit Diagnosis: Unsteadiness on feet (R26.81);Other abnormalities of gait and mobility (R26.89);Muscle weakness (generalized) (M62.81)     Time: 1021-1173 PT Time Calculation (min) (ACUTE ONLY): 23 min  Charges:  $Gait Training: 8-22 mins $Therapeutic Exercise: 8-22 mins                     Governor Rooks, PTA Acute Rehabilitation Services Pager [phone] Office [phone]     [name] 01/04/2019, 11:35 AM

## 2019-01-04 NOTE — Progress Notes (Signed)
Report given to nurse at SNF.

## 2019-01-04 NOTE — Progress Notes (Signed)
Patient being discharged to SNF. All instructions and education provided to patient. IV has been removed. Patient leaving unit via wheelchair.

## 2019-01-04 NOTE — Discharge Summary (Signed)
Physician Discharge Summary  OPAL DINNING YTK:[id number] DOB: [birth year] DOA: 01/01/2019  PCP: Janie Morning, DO  Admit date: 01/01/2019 Discharge date: 01/04/2019  Admitted From: Home  Disposition:  SNF   Recommendations for Outpatient Follow-up:  1. Follow up with GI in 2-3 weeks 2. Please obtain BMP/CBC in one week 3. Patient should have 2-3 soft BM per day; if having fewer BM that this, will need to increase Lactulose dosing or add Rifaximin 4. Please restrict sodium intake to 1500 mg daily and fluid intake to 1800 cc daily     Home Health: N/A  Equipment/Devices: TBD at SNF  Discharge Condition: Good  CODE STATUS: FULL Diet recommendation: Regular, fluid and salt restrictions as above  Brief/Interim Summary: Mrs. Warman is a 73 yo F with alcoholic cirrhosis who presented with confusion, weakness, and AKI.  Had recently been discharged on Lasix 180 mg daily and spironolactone 100 mg daily.        PRINCIPAL HOSPITAL DIAGNOSIS: Hepatic encephalopathy    Discharge Diagnoses:   Hepatic encephalopathy Presented dehydrated, ammonia 71.  Started on lactulose, IV fluids.  Mentation resolved to normal.  Discharged on lactulose.  Needs close monitoring of mental status, addition of rifaximin if needed.   Please arrange close follow up with GI.    AKI Likely hemodynamically mediated-in the setting of poor oral intake and diuretic use.   AKI resolved. Diuretics reduced.    Hypertension Controlled  Alcoholic liver cirrhosis Appears compensated.  Diuretics reduced.  Follow with primary gastroenterologist on discharge.  Thrombocytopenia Appears to be mild-likely secondary to hypersplenism in the setting of liver cirrhosis.  Stable for periodic monitoring.  Asymptomatic bacteriuria During her most recent hospitalization-she was treated for Enterococcus faecalis.  Urine cultures on 1/26 is still positive for enterococcus-she has no symptoms of UTI-this likely  represents a colonization rather than infection.          Discharge Instructions  Discharge Instructions    Diet general   Complete by:  As directed    Discharge instructions   Complete by:  As directed    From Dr. Loleta Books: You were admitted for confusion from "hepatic encephalopathy".   This is a condition that many if not most patient's with cirrhosis have. A substance called "ammonia" builds up in the bloodstream (because the liver normally clears it out, but is unable to do so in cirrhosis). Ammonia causes sleepiness, confusion, clouded thinking.  When the ammonia builds up, the only way to clear it is in bowel movements, and so people with this problem take laxatives like lactulose several times per day to have 2-3 soft BMs per day.  It is important to take lactulose 30g three times per day. The dose of Lactulose should be adjusted to have 2-3 soft BMs per day If you don't have 2-3 soft BMs per day, on average over the next week, either increase the dose of Lactulose or have your doctor in rehab start rifaximin  Call Dr. Lorie Apley office for a closer follow up appointment.   Increase activity slowly   Complete by:  As directed      Allergies as of 01/04/2019   No Known Allergies     Medication List    STOP taking these medications   guaiFENesin-dextromethorphan 100-10 MG/5ML syrup Commonly known as:  ROBITUSSIN DM   ondansetron 4 MG tablet Commonly known as:  ZOFRAN   potassium chloride SA 20 MEQ tablet Commonly known as:  K-DUR,KLOR-CON     TAKE these medications  aspirin EC 81 MG tablet Take 81 mg by mouth daily.   furosemide 20 MG tablet Commonly known as:  LASIX Take 1 tablet (20 mg total) by mouth daily. Start taking on:  January 05, 2019 What changed:    medication strength  how much to take  when to take this   lactulose 10 GM/15ML solution Commonly known as:  CHRONULAC Take 45 mLs (30 g total) by mouth 3 (three) times daily.   magnesium  oxide 400 (241.3 Mg) MG tablet Commonly known as:  MAG-OX Take 1 tablet (400 mg total) by mouth daily.   pantoprazole 40 MG tablet Commonly known as:  PROTONIX Take 1 tablet (40 mg total) by mouth 2 (two) times daily before a meal for 30 days.   spironolactone 50 MG tablet Commonly known as:  ALDACTONE Take 1 tablet (50 mg total) by mouth daily. Start taking on:  January 05, 2019 What changed:    medication strength  how much to take      Contact information for after-discharge care    Destination    [facility]erred SNF .   Service:  Skilled Nursing Contact information: [place] [phone]             No Known Allergies  Consultations:  None   Procedures/Studies: Dg Chest 2 View  Result Date: 12/15/2018 CLINICAL DATA:  Shortness of breath and fluid retention for 1 day. Recent hospitalization for sepsis and pneumonia. Former smoker. EXAM: CHEST - 2 VIEW COMPARISON:  01/09/2018 FINDINGS: Mild cardiac enlargement. No vascular congestion. Mild diffuse interstitial prominence to the lungs, likely representing early edema. Interstitial pneumonitis also possible. No blunting of costophrenic angles. No pneumothorax. No focal consolidation. Calcification of the aorta. Degenerative changes in the spine. IMPRESSION: Mild cardiac enlargement. Mild diffuse interstitial pattern to the lungs may represent early edema or interstitial pneumonitis. Electronically Signed   By: Lucienne Capers M.D.   On: 12/15/2018 22:34   Ct Abdomen Pelvis W Contrast  Result Date: 12/16/2018 CLINICAL DATA:  73 y/o  F; weight gain and shortness of breath. EXAM: CT ABDOMEN AND PELVIS WITH CONTRAST TECHNIQUE: Multidetector CT imaging of the abdomen and pelvis was performed using the standard protocol following bolus administration of intravenous contrast. CONTRAST:  144mL OMNIPAQUE IOHEXOL 300 MG/ML  SOLN COMPARISON:  09/16/2017  abdominal ultrasound. FINDINGS: Lower chest: Mild interlobular septal thickening and ground-glass opacities in the lung bases compatible with interstitial pulmonary edema. Cardiomegaly. Hepatobiliary: Mild enlargement of the left lobe of the liver suggestion of a lobulated contour may reflect underlying cirrhosis. No focal liver lesion. Cholelithiasis. No intra or extrahepatic biliary ductal dilatation. Recanalized umbilical vein and prominent omental vessels, probable portal caval collaterals. Pancreas: Unremarkable. No pancreatic ductal dilatation or surrounding inflammatory changes. Spleen: Normal in size without focal abnormality. Adrenals/Urinary Tract: Adrenal glands are unremarkable. Kidneys are normal, without renal calculi, focal lesion, or hydronephrosis. Bladder is unremarkable. Stomach/Bowel: Stomach is within normal limits. Appendix appears normal. No evidence of bowel wall thickening, distention, or inflammatory changes. Vascular/Lymphatic: Aortic atherosclerosis. No enlarged abdominal or pelvic lymph nodes. Reproductive: Status post hysterectomy. No adnexal masses. Other: Moderate volume of ascites. Diffuse subcutaneous edema. Musculoskeletal: No fracture is seen. L4-5 grade 1 anterolisthesis due to prominent facet arthropathy. IMPRESSION: 1. Findings compatible with cirrhosis including recanalized umbilical vein and omental collaterals. 2. Moderate volume of ascites.  Anasarca. 3. Mild interstitial pulmonary edema. Partially visualized cardiomegaly. 4.  Aortic Atherosclerosis (ICD10-I70.0). Electronically Signed  By: Kristine Garbe M.D.   On: 12/16/2018 02:52   US Abdomen Limited  Result Date: 12/18/2018 CLINICAL DATA:  73 year old female with cirrhosis and ascites. EXAM: LIMITED ABDOMEN ULTRASOUND FOR ASCITES TECHNIQUE: Limited ultrasound survey for ascites was performed in all four abdominal quadrants. COMPARISON:  None. FINDINGS: Sonographic interrogation of the 4 quadrants of the  abdomen demonstrates small volume ascites. There was borderline sufficient fluid for paracentesis. The patient deferred intervention at this time. IMPRESSION: Relatively mild ascites.  Paracentesis was deferred. Electronically Signed   By: Jacqulynn Cadet M.D.   On: 12/18/2018 11:48   Dg Chest Port 1 View  Result Date: 01/01/2019 CLINICAL DATA:  Weakness EXAM: PORTABLE CHEST 1 VIEW COMPARISON:  12/15/2018 FINDINGS: Stable cardiomegaly and mediastinal contours. Interstitial coarsening similar to priors. Mild retrocardiac atelectasis or scarring, also seen on abdominal CT 12/16/2018. IMPRESSION: No acute finding when compared to priors. Electronically Signed   By: Monte Fantasia M.D.   On: 01/01/2019 07:46   US Abdomen Limited Ruq  Result Date: 12/16/2018 CLINICAL DATA:  Attic cirrhosis EXAM: ULTRASOUND ABDOMEN LIMITED RIGHT UPPER QUADRANT COMPARISON:  CT from earlier today FINDINGS: Gallbladder: Layering sludge. The gallbladder wall is thickened to 4 mm, likely reactive to the patient's cirrhosis. A small shadowing stone is seen towards the fundus. Common bile duct: Diameter: 5 mm.  Where visualized, no filling defect. Liver: Cirrhotic liver with patent and antegrade flow in the main portal vein. Moderate ascites with simple appearance. No focal liver finding. IMPRESSION: 1. Gallbladder sludge and small stone. There is gallbladder wall thickening that is likely reactive to the chronic liver disease as there is no sonographic Murphy sign. 2. Cirrhosis with moderate ascites. There is antegrade flow in the main portal vein. Electronically Signed   By: Monte Fantasia M.D.   On: 12/16/2018 06:46       Subjective: Feeling well.  No confusion, moderate weakness.  No vomiting.  No diarrhea.  No icterus.  No fever.  Discharge Exam: Vitals:   01/04/19 0359 01/04/19 0736  BP: 133/64 128/63  Pulse: 72 78  Resp: 16 16  Temp: 98.1 F (36.7 C) 98.2 F (36.8 C)  SpO2: 97% 96%   Vitals:   01/03/19  1937 01/03/19 2336 01/04/19 0359 01/04/19 0736  BP: 134/68 130/68 133/64 128/63  Pulse: 76 85 72 78  Resp: 17 17 16 16   Temp: 98 F (36.7 C) 98.5 F (36.9 C) 98.1 F (36.7 C) 98.2 F (36.8 C)  TempSrc: Oral Oral Oral Oral  SpO2: 98% 94% 97% 96%  Weight:      Height:        General: Pt is alert, awake, not in acute distress, lying in bed Cardiovascular: RRR, nl S1-S2, no murmurs appreciated.   No LE edema.   Respiratory: Normal respiratory rate and rhythm.  CTAB without rales or wheezes. Abdominal: Abdomen soft and non-tender.  No distension or HSM.   Neuro/Psych: Strength symmetric in upper and lower extremities.  Judgment and insight appear normal.   The results of significant diagnostics from this hospitalization (including imaging, microbiology, ancillary and laboratory) are listed below for reference.     Microbiology: Recent Results (from the past 240 hour(s))  Blood Culture (routine x 2)     Status: None (Preliminary result)   Collection Time: 01/01/19  7:32 AM  Result Value Ref Range Status   Specimen Description BLOOD LEFT ANTECUBITAL  Final   Special Requests   Final    BOTTLES DRAWN AEROBIC AND  ANAEROBIC Blood Culture results may not be optimal due to an excessive volume of blood received in culture bottles   Culture   Final    NO GROWTH 3 DAYS Performed at [facility] [address], [place], [place] [address]    Report Status PENDING  Incomplete  Blood Culture (routine x 2)     Status: None (Preliminary result)   Collection Time: 01/01/19  7:34 AM  Result Value Ref Range Status   Specimen Description BLOOD RIGHT ANTECUBITAL  Final   Special Requests   Final    BOTTLES DRAWN AEROBIC AND ANAEROBIC Blood Culture results may not be optimal due to an inadequate volume of blood received in culture bottles   Culture   Final    NO GROWTH 3 DAYS Performed at [facility], [place] [address] [place], [place] [address]    Report Status PENDING  Incomplete   Urine culture     Status: Abnormal   Collection Time: 01/01/19  9:42 AM  Result Value Ref Range Status   Specimen Description URINE, RANDOM  Final   Special Requests   Final    NONE Performed at [facility], [place] [address], [place], [place] [address]    Culture 50,000 COLONIES/mL ENTEROCOCCUS FAECALIS (A)  Final   Report Status 01/03/2019 FINAL  Final   Organism ID, Bacteria ENTEROCOCCUS FAECALIS (A)  Final      Susceptibility   Enterococcus faecalis - MIC*    AMPICILLIN <=2 SENSITIVE Sensitive     LEVOFLOXACIN 1 SENSITIVE Sensitive     NITROFURANTOIN <=16 SENSITIVE Sensitive     VANCOMYCIN 1 SENSITIVE Sensitive     * 50,000 COLONIES/mL ENTEROCOCCUS FAECALIS     Labs: BNP (last 3 results) Recent Labs    12/16/18 0025  BNP 10.6   Basic Metabolic Panel: Recent Labs  Lab 01/01/19 0720 01/02/19 0756 01/03/19 0454 01/04/19 0506  NA 133* 135 135 135  K 4.9 4.0 3.6 3.6  CL 98 108 104 107  CO2 22 20* 21* 21*  GLUCOSE 113* 92 96 81  BUN 31* 16 11 9   CREATININE 1.41* 0.76 0.71 0.59  CALCIUM 9.5 8.6* 8.4* 8.3*  MG 2.6*  --   --   --   PHOS 4.5  --   --   --    Liver Function Tests: Recent Labs  Lab 01/01/19 0720 01/02/19 0756  AST 40 44*  ALT 40 36  ALKPHOS 96 86  BILITOT 3.8* 3.4*  PROT 8.0 6.5  ALBUMIN 3.3* 2.6*   Recent Labs  Lab 01/01/19 0720  LIPASE 65*   Recent Labs  Lab 01/01/19 0938 01/02/19 0756 01/03/19 0454  AMMONIA 71* 55* 60*   CBC: Recent Labs  Lab 01/01/19 0720 01/02/19 0756  WBC 6.4 5.8  NEUTROABS 4.1  --   HGB 12.8 11.7*  HCT 37.6 34.6*  MCV 98.2 99.1  PLT 166 124*   Cardiac Enzymes: No results for input(s): CKTOTAL, CKMB, CKMBINDEX, TROPONINI in the last 168 hours. BNP: Invalid input(s): POCBNP CBG: No results for input(s): GLUCAP in the last 168 hours. D-Dimer No results for input(s): DDIMER in the last 72 hours. Hgb A1c No results for input(s): HGBA1C in the last 72 hours. Lipid Profile No results for  input(s): CHOL, HDL, LDLCALC, TRIG, CHOLHDL, LDLDIRECT in the last 72 hours. Thyroid function studies No results for input(s): TSH, T4TOTAL, T3FREE, THYROIDAB in the last 72 hours.  Invalid input(s): FREET3 Anemia work up  No results for input(s): VITAMINB12, FOLATE, FERRITIN, TIBC, IRON, RETICCTPCT in the last 72 hours. Urinalysis    Component Value Date/Time   COLORURINE YELLOW 01/01/2019 [place] 01/01/2019 0942   LABSPEC 1.014 01/01/2019 0942   PHURINE 8.0 01/01/2019 0942   GLUCOSEU NEGATIVE 01/01/2019 0942   HGBUR NEGATIVE 01/01/2019 0942   BILIRUBINUR NEGATIVE 01/01/2019 0942   BILIRUBINUR 2 03/06/2011 [place] 01/01/2019 [place] 01/01/2019 0942   UROBILINOGEN neg 03/06/2011 1505   NITRITE NEGATIVE 01/01/2019 0942   LEUKOCYTESUR NEGATIVE 01/01/2019 0942   Sepsis Labs Invalid input(s): PROCALCITONIN,  WBC,  LACTICIDVEN Microbiology Recent Results (from the past 240 hour(s))  Blood Culture (routine x 2)     Status: None (Preliminary result)   Collection Time: 01/01/19  7:32 AM  Result Value Ref Range Status   Specimen Description BLOOD LEFT ANTECUBITAL  Final   Special Requests   Final    BOTTLES DRAWN AEROBIC AND ANAEROBIC Blood Culture results may not be optimal due to an excessive volume of blood received in culture bottles   Culture   Final    NO GROWTH 3 DAYS Performed at [facility], [place] [address] [place], [place] [address]    Report Status PENDING  Incomplete  Blood Culture (routine x 2)     Status: None (Preliminary result)   Collection Time: 01/01/19  7:34 AM  Result Value Ref Range Status   Specimen Description BLOOD RIGHT ANTECUBITAL  Final   Special Requests   Final    BOTTLES DRAWN AEROBIC AND ANAEROBIC Blood Culture results may not be optimal due to an inadequate volume of blood received in culture bottles   Culture   Final    NO GROWTH 3 DAYS Performed at [facility], [place] [address], [place], [place] [address]    Report Status PENDING  Incomplete  Urine culture     Status: Abnormal   Collection Time: 01/01/19  9:42 AM  Result Value Ref Range Status   Specimen Description URINE, RANDOM  Final   Special Requests   Final    NONE Performed at [facility], [place] [address] [place], [place] [address]    Culture 50,000 COLONIES/mL ENTEROCOCCUS FAECALIS (A)  Final   Report Status 01/03/2019 FINAL  Final   Organism ID, Bacteria ENTEROCOCCUS FAECALIS (A)  Final      Susceptibility   Enterococcus faecalis - MIC*    AMPICILLIN <=2 SENSITIVE Sensitive     LEVOFLOXACIN 1 SENSITIVE Sensitive     NITROFURANTOIN <=16 SENSITIVE Sensitive     VANCOMYCIN 1 SENSITIVE Sensitive     * 50,000 COLONIES/mL ENTEROCOCCUS FAECALIS     Time coordinating discharge: 25 minutes          SIGNED:   Edwin Dada, MD  [facility] 01/04/2019, 11:25 AM

## 2019-01-06 LAB — CULTURE, BLOOD (ROUTINE X 2)
Culture: NO GROWTH
Culture: NO GROWTH

## 2019-01-08 DIAGNOSIS — E440 Moderate protein-calorie malnutrition: Secondary | ICD-10-CM | POA: Diagnosis not present

## 2019-01-08 DIAGNOSIS — K7290 Hepatic failure, unspecified without coma: Secondary | ICD-10-CM | POA: Diagnosis not present

## 2019-01-08 DIAGNOSIS — I10 Essential (primary) hypertension: Secondary | ICD-10-CM | POA: Diagnosis not present

## 2019-01-08 DIAGNOSIS — N179 Acute kidney failure, unspecified: Secondary | ICD-10-CM | POA: Diagnosis not present

## 2019-01-08 DIAGNOSIS — D696 Thrombocytopenia, unspecified: Secondary | ICD-10-CM | POA: Diagnosis not present

## 2019-01-08 DIAGNOSIS — R2689 Other abnormalities of gait and mobility: Secondary | ICD-10-CM | POA: Diagnosis not present

## 2019-01-08 DIAGNOSIS — R8271 Bacteriuria: Secondary | ICD-10-CM | POA: Diagnosis not present

## 2019-01-08 DIAGNOSIS — K7030 Alcoholic cirrhosis of liver without ascites: Secondary | ICD-10-CM | POA: Diagnosis not present

## 2019-01-08 DIAGNOSIS — G9349 Other encephalopathy: Secondary | ICD-10-CM | POA: Diagnosis not present

## 2019-01-17 DIAGNOSIS — G9340 Encephalopathy, unspecified: Secondary | ICD-10-CM | POA: Diagnosis not present

## 2019-01-17 DIAGNOSIS — K7031 Alcoholic cirrhosis of liver with ascites: Secondary | ICD-10-CM | POA: Diagnosis not present

## 2019-01-17 DIAGNOSIS — E860 Dehydration: Secondary | ICD-10-CM | POA: Diagnosis not present

## 2019-02-07 DIAGNOSIS — K7030 Alcoholic cirrhosis of liver without ascites: Secondary | ICD-10-CM | POA: Diagnosis not present

## 2019-02-07 DIAGNOSIS — R748 Abnormal levels of other serum enzymes: Secondary | ICD-10-CM | POA: Diagnosis not present

## 2019-02-07 DIAGNOSIS — K5730 Diverticulosis of large intestine without perforation or abscess without bleeding: Secondary | ICD-10-CM | POA: Diagnosis not present

## 2019-02-08 ENCOUNTER — Other Ambulatory Visit: Payer: Self-pay | Admitting: Gastroenterology

## 2019-02-08 DIAGNOSIS — E785 Hyperlipidemia, unspecified: Secondary | ICD-10-CM | POA: Diagnosis not present

## 2019-02-08 DIAGNOSIS — D696 Thrombocytopenia, unspecified: Secondary | ICD-10-CM | POA: Diagnosis not present

## 2019-02-08 DIAGNOSIS — D709 Neutropenia, unspecified: Secondary | ICD-10-CM | POA: Diagnosis not present

## 2019-02-15 DIAGNOSIS — K5900 Constipation, unspecified: Secondary | ICD-10-CM | POA: Diagnosis not present

## 2019-02-15 DIAGNOSIS — K7031 Alcoholic cirrhosis of liver with ascites: Secondary | ICD-10-CM | POA: Diagnosis not present

## 2019-02-15 DIAGNOSIS — D696 Thrombocytopenia, unspecified: Secondary | ICD-10-CM | POA: Diagnosis not present

## 2019-03-22 DIAGNOSIS — N182 Chronic kidney disease, stage 2 (mild): Secondary | ICD-10-CM | POA: Diagnosis not present

## 2019-03-22 DIAGNOSIS — D696 Thrombocytopenia, unspecified: Secondary | ICD-10-CM | POA: Diagnosis not present

## 2019-03-30 DIAGNOSIS — K7031 Alcoholic cirrhosis of liver with ascites: Secondary | ICD-10-CM | POA: Diagnosis not present

## 2019-03-30 DIAGNOSIS — R229 Localized swelling, mass and lump, unspecified: Secondary | ICD-10-CM | POA: Diagnosis not present

## 2019-03-30 DIAGNOSIS — R251 Tremor, unspecified: Secondary | ICD-10-CM | POA: Diagnosis not present

## 2019-03-30 DIAGNOSIS — G9340 Encephalopathy, unspecified: Secondary | ICD-10-CM | POA: Diagnosis not present

## 2019-04-21 ENCOUNTER — Ambulatory Visit (HOSPITAL_COMMUNITY): Admit: 2019-04-21 | Payer: Medicare Other | Admitting: Gastroenterology

## 2019-04-21 ENCOUNTER — Encounter (HOSPITAL_COMMUNITY): Payer: Self-pay

## 2019-04-21 SURGERY — ESOPHAGOGASTRODUODENOSCOPY (EGD) WITH PROPOFOL
Anesthesia: Monitor Anesthesia Care

## 2019-05-11 DIAGNOSIS — D696 Thrombocytopenia, unspecified: Secondary | ICD-10-CM | POA: Diagnosis not present

## 2019-05-11 DIAGNOSIS — K7031 Alcoholic cirrhosis of liver with ascites: Secondary | ICD-10-CM | POA: Diagnosis not present

## 2019-05-18 DIAGNOSIS — D709 Neutropenia, unspecified: Secondary | ICD-10-CM | POA: Diagnosis not present

## 2019-05-18 DIAGNOSIS — Z7189 Other specified counseling: Secondary | ICD-10-CM | POA: Diagnosis not present

## 2019-05-18 DIAGNOSIS — Z0000 Encounter for general adult medical examination without abnormal findings: Secondary | ICD-10-CM | POA: Diagnosis not present

## 2019-05-18 DIAGNOSIS — R946 Abnormal results of thyroid function studies: Secondary | ICD-10-CM | POA: Diagnosis not present

## 2019-05-18 DIAGNOSIS — N182 Chronic kidney disease, stage 2 (mild): Secondary | ICD-10-CM | POA: Diagnosis not present

## 2019-05-18 DIAGNOSIS — K7031 Alcoholic cirrhosis of liver with ascites: Secondary | ICD-10-CM | POA: Diagnosis not present

## 2019-05-18 DIAGNOSIS — D696 Thrombocytopenia, unspecified: Secondary | ICD-10-CM | POA: Diagnosis not present

## 2019-07-04 DIAGNOSIS — H04123 Dry eye syndrome of bilateral lacrimal glands: Secondary | ICD-10-CM | POA: Diagnosis not present

## 2019-07-07 ENCOUNTER — Other Ambulatory Visit: Payer: Self-pay

## 2019-07-18 DIAGNOSIS — H1859 Other hereditary corneal dystrophies: Secondary | ICD-10-CM | POA: Diagnosis not present

## 2019-07-18 DIAGNOSIS — H04123 Dry eye syndrome of bilateral lacrimal glands: Secondary | ICD-10-CM | POA: Diagnosis not present

## 2019-07-21 DIAGNOSIS — H2512 Age-related nuclear cataract, left eye: Secondary | ICD-10-CM | POA: Diagnosis not present

## 2019-07-21 DIAGNOSIS — Z01818 Encounter for other preprocedural examination: Secondary | ICD-10-CM | POA: Diagnosis not present

## 2019-08-07 DIAGNOSIS — H25812 Combined forms of age-related cataract, left eye: Secondary | ICD-10-CM | POA: Diagnosis not present

## 2019-08-07 DIAGNOSIS — H2512 Age-related nuclear cataract, left eye: Secondary | ICD-10-CM | POA: Diagnosis not present

## 2019-08-09 DIAGNOSIS — R946 Abnormal results of thyroid function studies: Secondary | ICD-10-CM | POA: Diagnosis not present

## 2019-08-09 DIAGNOSIS — K7031 Alcoholic cirrhosis of liver with ascites: Secondary | ICD-10-CM | POA: Diagnosis not present

## 2019-08-09 DIAGNOSIS — N182 Chronic kidney disease, stage 2 (mild): Secondary | ICD-10-CM | POA: Diagnosis not present

## 2019-08-09 DIAGNOSIS — E785 Hyperlipidemia, unspecified: Secondary | ICD-10-CM | POA: Diagnosis not present

## 2019-08-17 DIAGNOSIS — K7031 Alcoholic cirrhosis of liver with ascites: Secondary | ICD-10-CM | POA: Diagnosis not present

## 2019-08-17 DIAGNOSIS — D709 Neutropenia, unspecified: Secondary | ICD-10-CM | POA: Diagnosis not present

## 2019-08-17 DIAGNOSIS — R0981 Nasal congestion: Secondary | ICD-10-CM | POA: Diagnosis not present

## 2019-08-17 DIAGNOSIS — D696 Thrombocytopenia, unspecified: Secondary | ICD-10-CM | POA: Diagnosis not present

## 2019-08-17 DIAGNOSIS — Z0000 Encounter for general adult medical examination without abnormal findings: Secondary | ICD-10-CM | POA: Diagnosis not present

## 2019-08-17 DIAGNOSIS — Z23 Encounter for immunization: Secondary | ICD-10-CM | POA: Diagnosis not present

## 2019-08-17 DIAGNOSIS — E559 Vitamin D deficiency, unspecified: Secondary | ICD-10-CM | POA: Diagnosis not present

## 2019-08-17 DIAGNOSIS — Z7189 Other specified counseling: Secondary | ICD-10-CM | POA: Diagnosis not present

## 2019-08-21 DIAGNOSIS — H25811 Combined forms of age-related cataract, right eye: Secondary | ICD-10-CM | POA: Diagnosis not present

## 2019-08-21 DIAGNOSIS — H2511 Age-related nuclear cataract, right eye: Secondary | ICD-10-CM | POA: Diagnosis not present

## 2019-09-02 DIAGNOSIS — Z1231 Encounter for screening mammogram for malignant neoplasm of breast: Secondary | ICD-10-CM | POA: Diagnosis not present

## 2019-10-24 DIAGNOSIS — G9340 Encephalopathy, unspecified: Secondary | ICD-10-CM | POA: Diagnosis not present

## 2019-10-24 DIAGNOSIS — R197 Diarrhea, unspecified: Secondary | ICD-10-CM | POA: Diagnosis not present

## 2019-10-24 DIAGNOSIS — K7031 Alcoholic cirrhosis of liver with ascites: Secondary | ICD-10-CM | POA: Diagnosis not present

## 2019-10-24 DIAGNOSIS — Z20828 Contact with and (suspected) exposure to other viral communicable diseases: Secondary | ICD-10-CM | POA: Diagnosis not present

## 2019-10-24 DIAGNOSIS — R509 Fever, unspecified: Secondary | ICD-10-CM | POA: Diagnosis not present

## 2019-11-06 DIAGNOSIS — H02839 Dermatochalasis of unspecified eye, unspecified eyelid: Secondary | ICD-10-CM | POA: Diagnosis not present

## 2019-11-06 DIAGNOSIS — H02403 Unspecified ptosis of bilateral eyelids: Secondary | ICD-10-CM | POA: Diagnosis not present

## 2019-11-15 DIAGNOSIS — H02839 Dermatochalasis of unspecified eye, unspecified eyelid: Secondary | ICD-10-CM | POA: Diagnosis not present

## 2019-11-15 DIAGNOSIS — Z01818 Encounter for other preprocedural examination: Secondary | ICD-10-CM | POA: Diagnosis not present

## 2019-12-11 DIAGNOSIS — H02403 Unspecified ptosis of bilateral eyelids: Secondary | ICD-10-CM | POA: Diagnosis not present

## 2019-12-11 DIAGNOSIS — H02834 Dermatochalasis of left upper eyelid: Secondary | ICD-10-CM | POA: Diagnosis not present

## 2019-12-11 DIAGNOSIS — H02831 Dermatochalasis of right upper eyelid: Secondary | ICD-10-CM | POA: Diagnosis not present

## 2019-12-30 ENCOUNTER — Ambulatory Visit: Payer: Medicare Other | Attending: Internal Medicine

## 2019-12-30 DIAGNOSIS — Z23 Encounter for immunization: Secondary | ICD-10-CM | POA: Insufficient documentation

## 2019-12-30 NOTE — Progress Notes (Signed)
   Covid-19 Vaccination Clinic  Name:  Darlene Delacruz    MRN: [id number] DOB: 19-Jan-1946  12/30/2019  [name] was observed post Covid-19 immunization for 15 minutes without incidence. She was provided with Vaccine Information Sheet and instruction to access the V-Safe system.   [name] was instructed to call 911 with any severe reactions post vaccine: Marland Kitchen Difficulty breathing  . Swelling of your face and throat  . A fast heartbeat  . A bad rash all over your body  . Dizziness and weakness    Immunizations Administered    Name Date Dose VIS Date Route   Pfizer COVID-19 Vaccine 12/30/2019 11:00 AM 0.3 mL 11/17/2019 Intramuscular   Manufacturer: [place]   Lot: [id number]   [place]: [id number]

## 2020-01-20 ENCOUNTER — Ambulatory Visit: Payer: Medicare Other | Attending: Internal Medicine

## 2020-01-20 DIAGNOSIS — Z23 Encounter for immunization: Secondary | ICD-10-CM | POA: Insufficient documentation

## 2020-01-20 NOTE — Progress Notes (Signed)
   Covid-19 Vaccination Clinic  Name:  Darlene Delacruz    MRN: [id number] DOB: [birth year]  01/20/2020  [name] was observed post Covid-19 immunization for 15 minutes without incidence. She was provided with Vaccine Information Sheet and instruction to access the V-Safe system.   [name] was instructed to call 911 with any severe reactions post vaccine: Marland Kitchen Difficulty breathing  . Swelling of your face and throat  . A fast heartbeat  . A bad rash all over your body  . Dizziness and weakness    Immunizations Administered    Name Date Dose VIS Date Route   Pfizer COVID-19 Vaccine 01/20/2020 10:07 AM 0.3 mL 11/17/2019 Intramuscular   Manufacturer: [place]   Lot: [id number]   [place]: [id number]

## 2020-02-14 DIAGNOSIS — D709 Neutropenia, unspecified: Secondary | ICD-10-CM | POA: Diagnosis not present

## 2020-02-14 DIAGNOSIS — E785 Hyperlipidemia, unspecified: Secondary | ICD-10-CM | POA: Diagnosis not present

## 2020-02-14 DIAGNOSIS — R197 Diarrhea, unspecified: Secondary | ICD-10-CM | POA: Diagnosis not present

## 2020-02-14 DIAGNOSIS — E559 Vitamin D deficiency, unspecified: Secondary | ICD-10-CM | POA: Diagnosis not present

## 2020-02-14 DIAGNOSIS — D696 Thrombocytopenia, unspecified: Secondary | ICD-10-CM | POA: Diagnosis not present

## 2020-02-15 DIAGNOSIS — R0981 Nasal congestion: Secondary | ICD-10-CM | POA: Diagnosis not present

## 2020-02-15 DIAGNOSIS — D709 Neutropenia, unspecified: Secondary | ICD-10-CM | POA: Diagnosis not present

## 2020-02-15 DIAGNOSIS — E559 Vitamin D deficiency, unspecified: Secondary | ICD-10-CM | POA: Diagnosis not present

## 2020-02-15 DIAGNOSIS — Z0000 Encounter for general adult medical examination without abnormal findings: Secondary | ICD-10-CM | POA: Diagnosis not present

## 2020-02-15 DIAGNOSIS — J3489 Other specified disorders of nose and nasal sinuses: Secondary | ICD-10-CM | POA: Diagnosis not present

## 2020-02-15 DIAGNOSIS — K7031 Alcoholic cirrhosis of liver with ascites: Secondary | ICD-10-CM | POA: Diagnosis not present

## 2020-02-15 DIAGNOSIS — D696 Thrombocytopenia, unspecified: Secondary | ICD-10-CM | POA: Diagnosis not present

## 2020-03-15 DIAGNOSIS — T444X5A Adverse effect of predominantly alpha-adrenoreceptor agonists, initial encounter: Secondary | ICD-10-CM | POA: Diagnosis not present

## 2020-03-15 DIAGNOSIS — H6121 Impacted cerumen, right ear: Secondary | ICD-10-CM | POA: Diagnosis not present

## 2020-03-15 DIAGNOSIS — J3489 Other specified disorders of nose and nasal sinuses: Secondary | ICD-10-CM | POA: Diagnosis not present

## 2020-03-15 DIAGNOSIS — J340 Abscess, furuncle and carbuncle of nose: Secondary | ICD-10-CM | POA: Diagnosis not present

## 2020-03-28 DIAGNOSIS — H6121 Impacted cerumen, right ear: Secondary | ICD-10-CM | POA: Diagnosis not present

## 2020-03-28 DIAGNOSIS — J342 Deviated nasal septum: Secondary | ICD-10-CM | POA: Diagnosis not present

## 2020-05-02 ENCOUNTER — Other Ambulatory Visit: Payer: Self-pay

## 2020-05-02 ENCOUNTER — Encounter: Payer: Self-pay | Admitting: Unknown Physician Specialty

## 2020-05-08 ENCOUNTER — Other Ambulatory Visit: Payer: Self-pay

## 2020-05-08 ENCOUNTER — Other Ambulatory Visit
Admission: RE | Admit: 2020-05-08 | Discharge: 2020-05-08 | Disposition: A | Discharge status: Home / Self Care | Payer: Medicare Other | Source: Ambulatory Visit | Attending: Unknown Physician Specialty | Admitting: Unknown Physician Specialty

## 2020-05-08 DIAGNOSIS — Z01812 Encounter for preprocedural laboratory examination: Secondary | ICD-10-CM | POA: Diagnosis not present

## 2020-05-08 DIAGNOSIS — Z20822 Contact with and (suspected) exposure to covid-19: Secondary | ICD-10-CM | POA: Diagnosis not present

## 2020-05-08 LAB — SARS CORONAVIRUS 2 (TAT 6-24 HRS): SARS Coronavirus 2: NEGATIVE

## 2020-05-08 NOTE — Discharge Instructions (Signed)
[facility] ENDOSCOPIC SINUS SURGERY [facility]  What is Functional Endoscopic Sinus Surgery?  The Surgery involves making the natural openings of the sinuses larger by removing the bony partitions that separate the sinuses from the nasal cavity.  The natural sinus lining is preserved as much as possible to allow the sinuses to resume normal function after the surgery.  In some patients nasal polyps (excessively swollen lining of the sinuses) may be removed to relieve obstruction of the sinus openings.  The surgery is performed through the nose using lighted scopes, which eliminates the need for incisions on the face.  A septoplasty is a different procedure which is sometimes performed with sinus surgery.  It involves straightening the boy partition that separates the two sides of your nose.  A crooked or deviated septum may need repair if is obstructing the sinuses or nasal airflow.  Turbinate reduction is also often performed during sinus surgery.  The turbinates are bony proturberances from the side walls of the nose which swell and can obstruct the nose in patients with sinus and allergy problems.  Their size can be surgically reduced to help relieve nasal obstruction.  What Can Sinus Surgery Do For Me?  Sinus surgery can reduce the frequency of sinus infections requiring antibiotic treatment.  This can provide improvement in nasal congestion, post-nasal drainage, facial pressure and nasal obstruction.  Surgery will NOT prevent you from ever having an infection again, so it usually only for patients who get infections 4 or more times yearly requiring antibiotics, or for infections that do not clear with antibiotics.  It will not cure nasal allergies, so patients with allergies may still require medication to treat their allergies after surgery. Surgery may improve headaches related to sinusitis, however, some people will continue to  require medication to control sinus headaches related to allergies.  Surgery will do nothing for other forms of headache (migraine, tension or cluster).  What Are the Risks of Endoscopic Sinus Surgery?  Current techniques allow surgery to be performed safely with little risk, however, there are rare complications that patients should be aware of.  Because the sinuses are located around the eyes, there is risk of eye injury, including blindness, though again, this would be quite rare. This is usually a result of bleeding behind the eye during surgery, which puts the vision oat risk, though there are treatments to protect the vision and prevent permanent disrupted by surgery causing a leak of the spinal fluid that surrounds the brain.  More serious complications would include bleeding inside the brain cavity or damage to the brain.  Again, all of these complications are uncommon, and spinal fluid leaks can be safely managed surgically if they occur.  The most common complication of sinus surgery is bleeding from the nose, which may require packing or cauterization of the nose.  Continued sinus have polyps may experience recurrence of the polyps requiring revision surgery.  Alterations of sense of smell or injury to the tear ducts are also rare complications.   What is the Surgery Like, and what is the Recovery?  The Surgery usually takes a couple of hours to perform, and is usually performed under a general anesthetic (completely asleep).  Patients are usually discharged home after a couple of hours.  Sometimes during surgery it is necessary to pack the nose to control bleeding, and the packing is left in place for 24 - 48 hours, and removed by your surgeon.    If a septoplasty was performed during the procedure, there is often a splint placed which must be removed after 5-7 days.   Discomfort: Pain is usually mild to moderate, and can be controlled by prescription pain medication or acetaminophen (Tylenol).   Aspirin, Ibuprofen (Advil, Motrin), or Naprosyn (Aleve) should be avoided, as they can cause increased bleeding.  Most patients feel sinus pressure like they have a bad head cold for several days.  Sleeping with your head elevated can help reduce swelling and facial pressure, as can ice packs over the face.  A humidifier may be helpful to keep the mucous and blood from drying in the nose.   Diet: There are no specific diet restrictions, however, you should generally start with clear liquids and a light diet of bland foods because the anesthetic can cause some nausea.  Advance your diet depending on how your stomach feels.  Taking your pain medication with food will often help reduce stomach upset which pain medications can cause.  Nasal Saline Irrigation: It is important to remove blood clots and dried mucous from the nose as it is healing.  This is done by having you irrigate the nose at least 3 - 4 times daily with a salt water solution.  We recommend using NeilMed Sinus Rinse (available at the drug store).  Fill the squeeze bottle with the solution, bend over a sink, and insert the tip of the squeeze bottle into the nose  of an inch.  Point the tip of the squeeze bottle towards the inside corner of the eye on the same side your irrigating.  Squeeze the bottle and gently irrigate the nose.  If you bend forward as you do this, most of the fluid will flow back out of the nose, instead of down your throat.   The solution should be warm, near body temperature, when you irrigate.   Each time you irrigate, you should use a full squeeze bottle.   Note that if you are instructed to use Nasal Steroid Sprays at any time after your surgery, irrigate with saline BEFORE using the steroid spray, so you do not wash it all out of the nose. Another product, Nasal Saline Gel (such as AYR Nasal Saline Gel) can be applied in each nostril 3 - 4 times daily to moisture the nose and reduce scabbing or crusting.  Bleeding:   Bloody drainage from the nose can be expected for several days, and patients are instructed to irrigate their nose frequently with salt water to help remove mucous and blood clots.  The drainage may be dark red or brown, though some fresh blood may be seen intermittently, especially after irrigation.  Do not blow you nose, as bleeding may occur. If you must sneeze, keep your mouth open to allow air to escape through your mouth.  If heavy bleeding occurs: Irrigate the nose with saline to rinse out clots, then spray the nose 3 - 4 times with Afrin Nasal Decongestant Spray.  The spray will constrict the blood vessels to slow bleeding.  Pinch the lower half of your nose shut to apply pressure, and lay down with your head elevated.  Ice packs over the nose may help as well. If bleeding persists despite these measures, you should notify your doctor.  Do not use the Afrin routinely to control nasal congestion after surgery, as it can result in worsening congestion and may affect healing.     Activity: Return to work varies among patients. Most patients will be   out of work at least 5 - 7 days to recover.  Patient may return to work after they are off of narcotic pain medication, and feeling well enough to perform the functions of their job.  Patients must avoid heavy lifting (over 10 pounds) or strenuous physical for 2 weeks after surgery, so your employer may need to assign you to light duty, or keep you out of work longer if light duty is not possible.  NOTE: you should not drive, operate dangerous machinery, do any mentally demanding tasks or make any important legal or financial decisions while on narcotic pain medication and recovering from the general anesthetic.    Call Your Doctor Immediately if You Have Any of the Following: 1. Bleeding that you cannot control with the above measures 2. Loss of vision, double vision, bulging of the eye or black eyes. 3. Fever over 101 degrees 4. Neck stiffness with  severe headache, fever, nausea and change in mental state. You are always encourage to call anytime with concerns, however, please call with requests for pain medication refills during office hours.  Office Endoscopy: During follow-up visits your doctor will remove any packing or splints that may have been placed and evaluate and clean your sinuses endoscopically.  Topical anesthetic will be used to make this as comfortable as possible, though you may want to take your pain medication prior to the visit.  How often this will need to be done varies from patient to patient.  After complete recovery from the surgery, you may need follow-up endoscopy from time to time, particularly if there is concern of recurrent infection or nasal polyps.  General Anesthesia, Adult, Care After This sheet gives you information about how to care for yourself after your procedure. Your health care provider may also give you more specific instructions. If you have problems or questions, contact your health care provider. What can I expect after the procedure? After the procedure, the following side effects are common:  Pain or discomfort at the IV site.  Nausea.  Vomiting.  Sore throat.  Trouble concentrating.  Feeling cold or chills.  Weak or tired.  Sleepiness and fatigue.  Soreness and body aches. These side effects can affect parts of the body that were not involved in surgery. Follow these instructions at home:  For at least 24 hours after the procedure:  Have a responsible adult stay with you. It is important to have someone help care for you until you are awake and alert.  Rest as needed.  Do not: ? Participate in activities in which you could fall or become injured. ? Drive. ? Use heavy machinery. ? Drink alcohol. ? Take sleeping pills or medicines that cause drowsiness. ? Make important decisions or sign legal documents. ? Take care of children on your own. Eating and drinking  Follow  any instructions from your health care provider about eating or drinking restrictions.  When you feel hungry, start by eating small amounts of foods that are soft and easy to digest (bland), such as toast. Gradually return to your regular diet.  Drink enough fluid to keep your urine pale yellow.  If you vomit, rehydrate by drinking water, juice, or clear broth. General instructions  If you have sleep apnea, surgery and certain medicines can increase your risk for breathing problems. Follow instructions from your health care provider about wearing your sleep device: ? Anytime you are sleeping, including during daytime naps. ? While taking prescription pain medicines, sleeping medicines, or medicines that   make you drowsy.  Return to your normal activities as told by your health care provider. Ask your health care provider what activities are safe for you.  Take over-the-counter and prescription medicines only as told by your health care provider.  If you smoke, do not smoke without supervision.  Keep all follow-up visits as told by your health care provider. This is important. Contact a health care provider if:  You have nausea or vomiting that does not get better with medicine.  You cannot eat or drink without vomiting.  You have pain that does not get better with medicine.  You are unable to pass urine.  You develop a skin rash.  You have a fever.  You have redness around your IV site that gets worse. Get help right away if:  You have difficulty breathing.  You have chest pain.  You have blood in your urine or stool, or you vomit blood. Summary  After the procedure, it is common to have a sore throat or nausea. It is also common to feel tired.  Have a responsible adult stay with you for the first 24 hours after general anesthesia. It is important to have someone help care for you until you are awake and alert.  When you feel hungry, start by eating small amounts of  foods that are soft and easy to digest (bland), such as toast. Gradually return to your regular diet.  Drink enough fluid to keep your urine pale yellow.  Return to your normal activities as told by your health care provider. Ask your health care provider what activities are safe for you. This information is not intended to replace advice given to you by your health care provider. Make sure you discuss any questions you have with your health care provider. Document Revised: 11/26/2017 Document Reviewed: 07/09/2017 Elsevier Patient Education  2020 [employer]  

## 2020-05-13 IMAGING — DX DG CHEST 2V
2 series · 2 of 2 positions shown · non-contrast
Comparison: 01/09/2018

CLINICAL DATA: Shortness of breath and fluid retention for 1 day.
Recent hospitalization for sepsis and pneumonia. Former smoker.

EXAM:
CHEST - 2 VIEW

[chest pa]
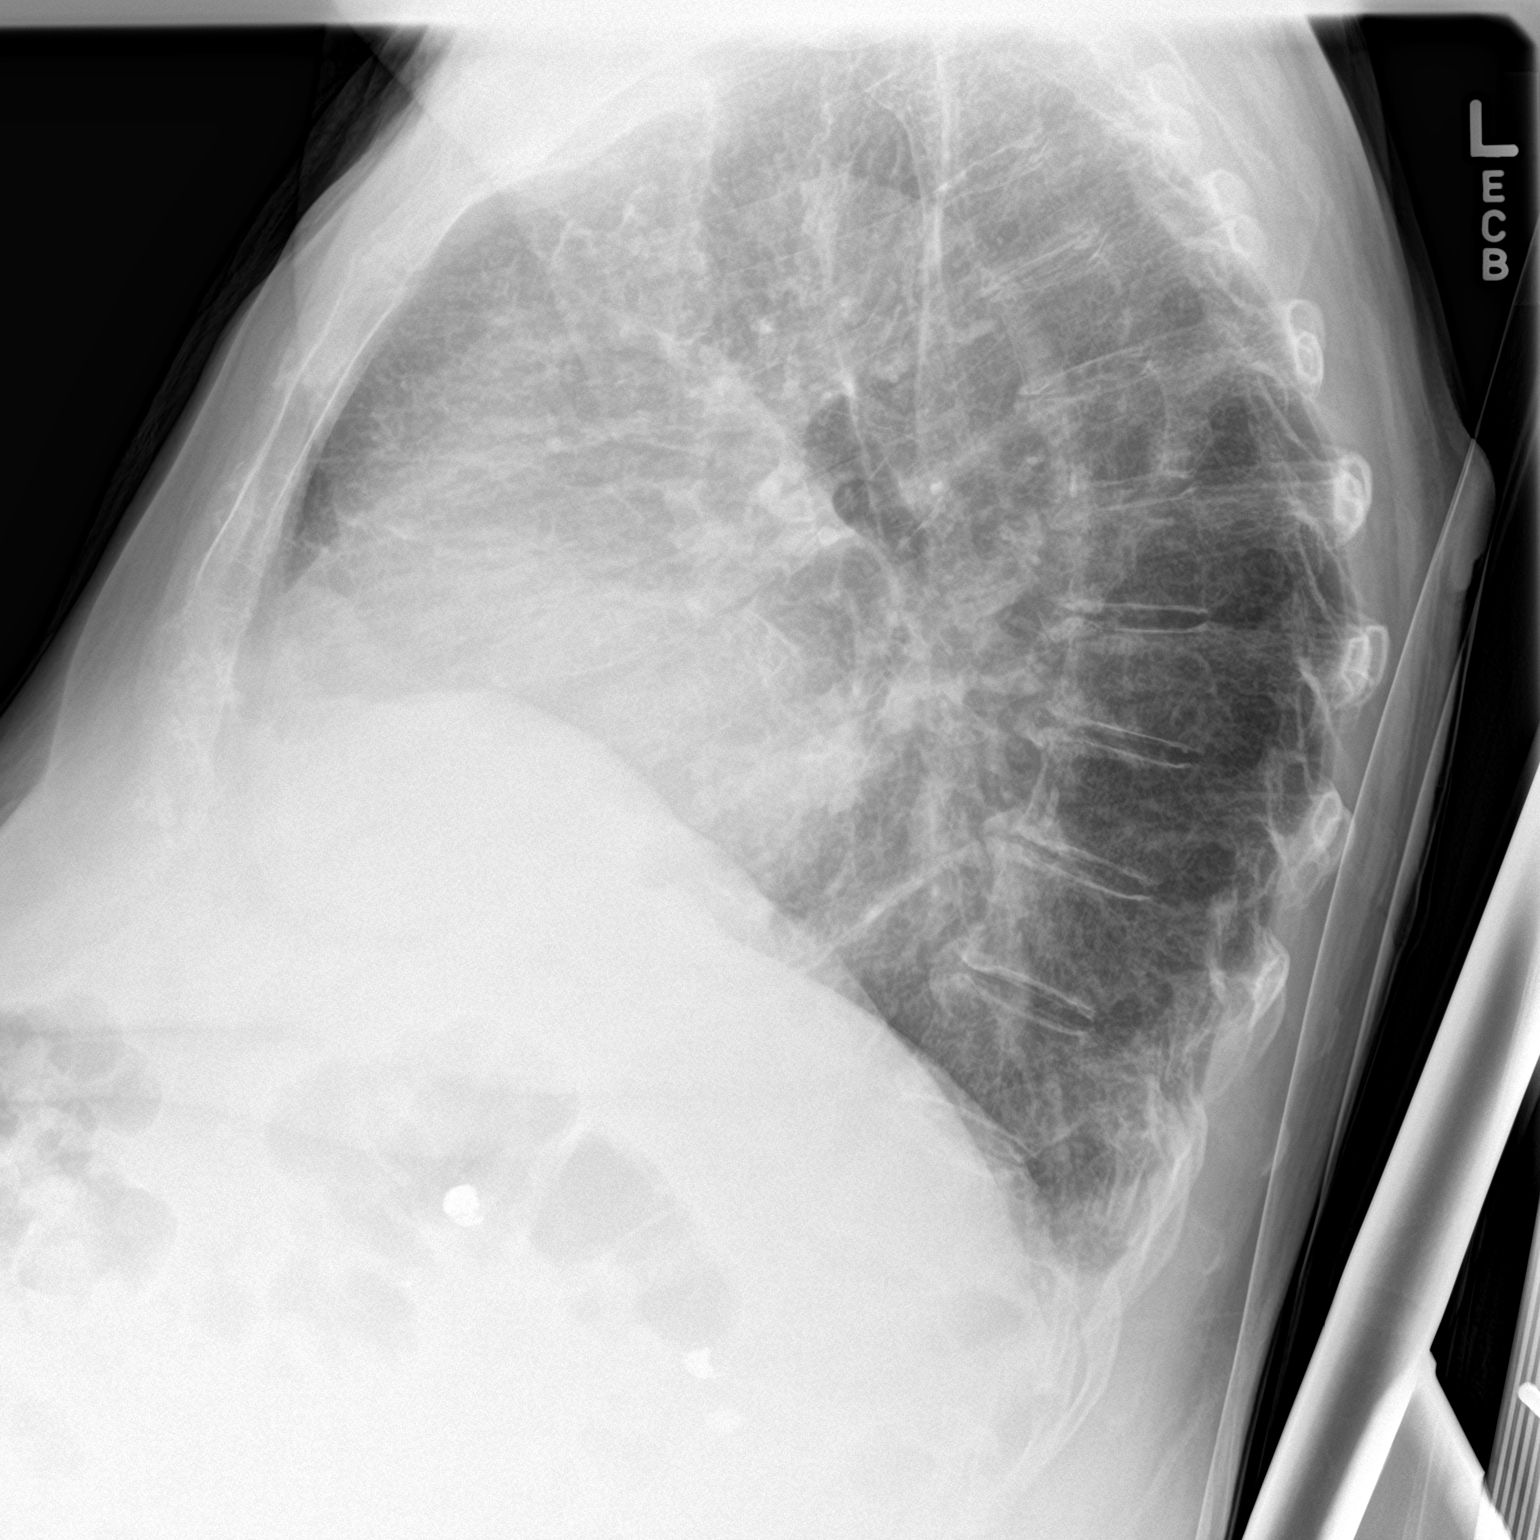

[chest ap]
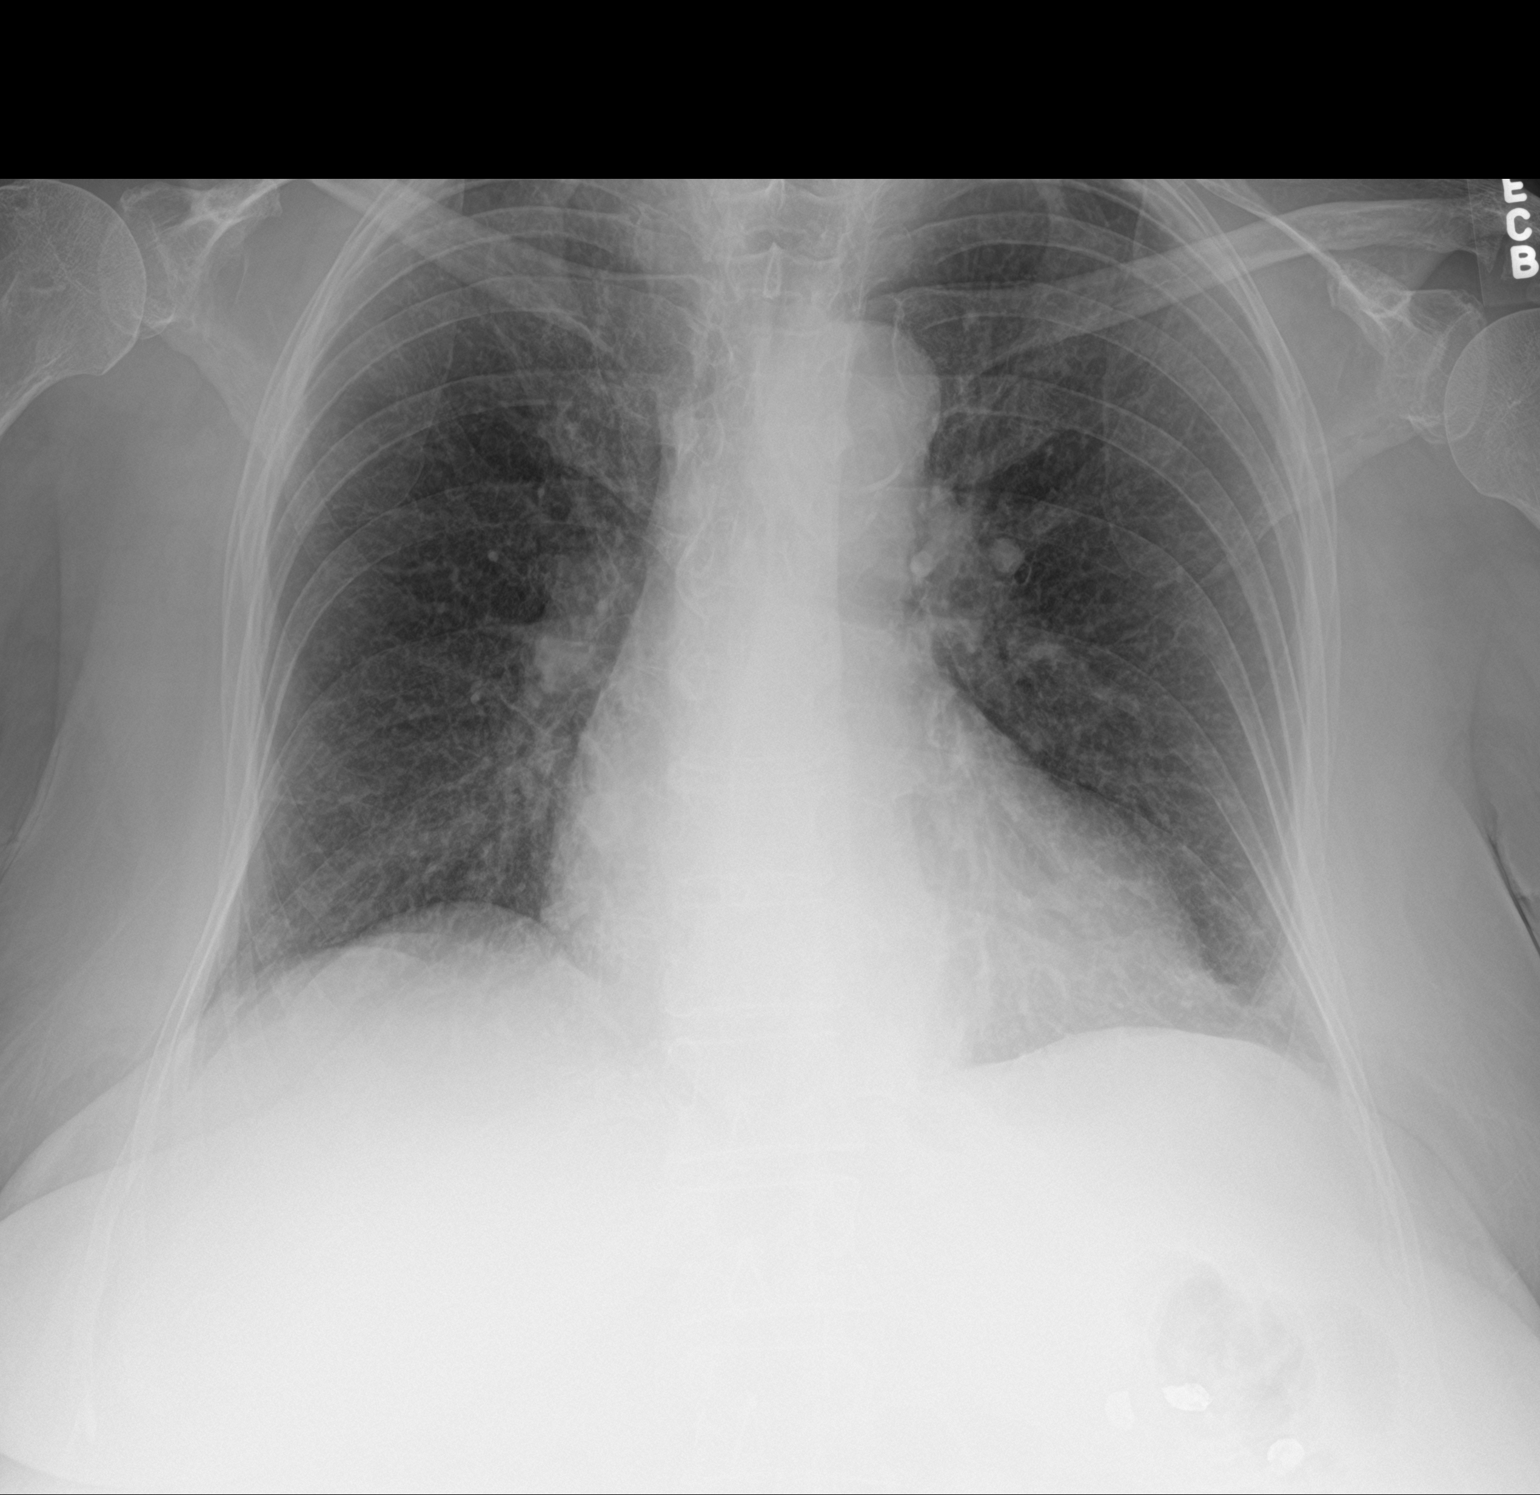

[2 of 2 positions shown; findings below may reference images not displayed]

FINDINGS: Mild cardiac enlargement. No vascular congestion. Mild diffuse
interstitial prominence to the lungs, likely representing early
edema. Interstitial pneumonitis also possible. No blunting of
costophrenic angles. No pneumothorax. No focal consolidation.
Calcification of the aorta. Degenerative changes in the spine.
IMPRESSION: Mild cardiac enlargement. Mild diffuse interstitial pattern to the
lungs may represent early edema or interstitial pneumonitis.

## 2020-05-15 ENCOUNTER — Other Ambulatory Visit: Payer: Self-pay

## 2020-05-15 ENCOUNTER — Other Ambulatory Visit
Admission: RE | Admit: 2020-05-15 | Discharge: 2020-05-15 | Disposition: A | Discharge status: Home / Self Care | Payer: Medicare Other | Source: Ambulatory Visit | Attending: Unknown Physician Specialty | Admitting: Unknown Physician Specialty

## 2020-05-15 DIAGNOSIS — Z20822 Contact with and (suspected) exposure to covid-19: Secondary | ICD-10-CM | POA: Diagnosis not present

## 2020-05-15 DIAGNOSIS — Z01812 Encounter for preprocedural laboratory examination: Secondary | ICD-10-CM | POA: Insufficient documentation

## 2020-05-15 LAB — SARS CORONAVIRUS 2 (TAT 6-24 HRS): SARS Coronavirus 2: NEGATIVE

## 2020-05-17 ENCOUNTER — Ambulatory Visit: Payer: Medicare Other | Admitting: Anesthesiology

## 2020-05-17 ENCOUNTER — Encounter: Payer: Self-pay | Admitting: Unknown Physician Specialty

## 2020-05-17 ENCOUNTER — Ambulatory Visit
Admission: RE | Admit: 2020-05-17 | Discharge: 2020-05-17 | Disposition: A | Discharge status: Home / Self Care | Payer: Medicare Other | Attending: Unknown Physician Specialty | Admitting: Unknown Physician Specialty

## 2020-05-17 ENCOUNTER — Encounter
Admission: RE | Disposition: A | Discharge status: Home / Self Care | Payer: Self-pay | Source: Home / Self Care | Attending: Unknown Physician Specialty

## 2020-05-17 ENCOUNTER — Other Ambulatory Visit: Payer: Self-pay

## 2020-05-17 DIAGNOSIS — K7460 Unspecified cirrhosis of liver: Secondary | ICD-10-CM | POA: Diagnosis not present

## 2020-05-17 DIAGNOSIS — J343 Hypertrophy of nasal turbinates: Secondary | ICD-10-CM | POA: Diagnosis not present

## 2020-05-17 DIAGNOSIS — J342 Deviated nasal septum: Secondary | ICD-10-CM | POA: Insufficient documentation

## 2020-05-17 DIAGNOSIS — Z87891 Personal history of nicotine dependence: Secondary | ICD-10-CM | POA: Insufficient documentation

## 2020-05-17 DIAGNOSIS — J3489 Other specified disorders of nose and nasal sinuses: Secondary | ICD-10-CM | POA: Insufficient documentation

## 2020-05-17 HISTORY — DX: Gastro-esophageal reflux disease without esophagitis: K21.9

## 2020-05-17 HISTORY — DX: Family history of other specified conditions: Z84.89

## 2020-05-17 HISTORY — PX: NASAL SEPTOPLASTY W/ TURBINOPLASTY: SHX2070

## 2020-05-17 SURGERY — SEPTOPLASTY, NOSE, WITH NASAL TURBINATE REDUCTION
Anesthesia: General | Site: Nose | Laterality: Bilateral

## 2020-05-17 MED ORDER — HYDROCODONE-ACETAMINOPHEN 5-300 MG PO TABS: 1.0000 | ORAL_TABLET | ORAL | 0 refills | Status: DC | PRN

## 2020-05-17 MED ORDER — ACETAMINOPHEN 10 MG/ML IV SOLN: 1000.0000 mg | Freq: Once | INTRAVENOUS | Status: DC

## 2020-05-17 MED ORDER — OXYCODONE HCL 5 MG PO TABS: 5.0000 mg | ORAL_TABLET | Freq: Once | ORAL | Status: AC | PRN

## 2020-05-17 MED ORDER — BACITRACIN 500 UNIT/GM EX OINT
TOPICAL_OINTMENT | CUTANEOUS | Status: DC | PRN
  Administered 2020-05-17: 1 via TOPICAL

## 2020-05-17 MED ORDER — SUCCINYLCHOLINE CHLORIDE 20 MG/ML IJ SOLN
INTRAMUSCULAR | Status: DC | PRN
  Administered 2020-05-17: 80 mg via INTRAVENOUS

## 2020-05-17 MED ORDER — EPHEDRINE SULFATE 50 MG/ML IJ SOLN
INTRAMUSCULAR | Status: DC | PRN
  Administered 2020-05-17: 5 mg via INTRAVENOUS

## 2020-05-17 MED ORDER — PROPOFOL 10 MG/ML IV BOLUS
INTRAVENOUS | Status: DC | PRN
  Administered 2020-05-17: 100 mg via INTRAVENOUS

## 2020-05-17 MED ORDER — DEXAMETHASONE SODIUM PHOSPHATE 4 MG/ML IJ SOLN
INTRAMUSCULAR | Status: DC | PRN
  Administered 2020-05-17: 10 mg via INTRAVENOUS

## 2020-05-17 MED ORDER — MIDAZOLAM HCL 5 MG/5ML IJ SOLN
INTRAMUSCULAR | Status: DC | PRN
  Administered 2020-05-17: 1 mg via INTRAVENOUS

## 2020-05-17 MED ORDER — ONDANSETRON HCL 4 MG/2ML IJ SOLN: 4.0000 mg | Freq: Once | INTRAMUSCULAR | Status: DC | PRN

## 2020-05-17 MED ORDER — SULFAMETHOXAZOLE-TRIMETHOPRIM 800-160 MG PO TABS
1.0000 | ORAL_TABLET | Freq: Two times a day (BID) | ORAL | 0 refills | Status: DC
Start: 2020-05-17 — End: 2022-03-19

## 2020-05-17 MED ORDER — FENTANYL CITRATE (PF) 100 MCG/2ML IJ SOLN: 25.0000 ug | INTRAMUSCULAR | Status: DC | PRN

## 2020-05-17 MED ORDER — LIDOCAINE-EPINEPHRINE 1 %-1:100000 IJ SOLN
INTRAMUSCULAR | Status: DC | PRN
  Administered 2020-05-17: 10 mL

## 2020-05-17 MED ORDER — GLYCOPYRROLATE 0.2 MG/ML IJ SOLN
INTRAMUSCULAR | Status: DC | PRN
  Administered 2020-05-17: .1 mg via INTRAVENOUS

## 2020-05-17 MED ORDER — LIDOCAINE HCL (CARDIAC) PF 100 MG/5ML IV SOSY
PREFILLED_SYRINGE | INTRAVENOUS | Status: DC | PRN
  Administered 2020-05-17: 50 mg via INTRAVENOUS

## 2020-05-17 MED ORDER — OXYCODONE HCL 5 MG/5ML PO SOLN
5.0000 mg | Freq: Once | ORAL | Status: AC | PRN
  Administered 2020-05-17: 5 mg via ORAL

## 2020-05-17 MED ORDER — LACTATED RINGERS IV SOLN
10.0000 mL/h | INTRAVENOUS | Status: DC
  Administered 2020-05-17: 10 mL/h via INTRAVENOUS

## 2020-05-17 MED ORDER — OXYMETAZOLINE HCL 0.05 % NA SOLN
6.0000 | Freq: Once | NASAL | Status: AC
  Administered 2020-05-17: 6 via NASAL

## 2020-05-17 MED ORDER — PHENYLEPHRINE HCL 0.5 % NA SOLN
NASAL | Status: DC | PRN
  Administered 2020-05-17: 30 mL

## 2020-05-17 MED ORDER — FENTANYL CITRATE (PF) 100 MCG/2ML IJ SOLN
INTRAMUSCULAR | Status: DC | PRN
  Administered 2020-05-17: 50 ug via INTRAVENOUS

## 2020-05-17 MED ORDER — ONDANSETRON HCL 4 MG/2ML IJ SOLN
INTRAMUSCULAR | Status: DC | PRN
  Administered 2020-05-17: 4 mg via INTRAVENOUS

## 2020-05-17 SURGICAL SUPPLY — 23 items
COAG SUCT 10F 3.5MM HAND CTRL (MISCELLANEOUS) ×2 IMPLANT
DRAPE HEAD BAR (DRAPES) ×2 IMPLANT
DRESSING NASL FOAM PST OP SINU (MISCELLANEOUS) ×2 IMPLANT
DRSG NASAL FOAM POST OP SINU (MISCELLANEOUS) ×4
ELECT REM PT RETURN 9FT ADLT (ELECTROSURGICAL) ×2
ELECTRODE REM PT RTRN 9FT ADLT (ELECTROSURGICAL) ×1 IMPLANT
GLOVE BIO SURGEON STRL SZ7.5 (GLOVE) ×4 IMPLANT
HANDLE YANKAUER SUCT BULB TIP (MISCELLANEOUS) ×2 IMPLANT
KIT TURNOVER KIT A (KITS) ×2 IMPLANT
NEEDLE HYPO 25GX1X1/2 BEV (NEEDLE) ×2 IMPLANT
PACK ENT CUSTOM (PACKS) ×2 IMPLANT
SPLINT NASAL SEPTAL BLV .25 LG (MISCELLANEOUS) IMPLANT
SPLINT NASAL SEPTAL BLV .50 ST (MISCELLANEOUS) ×2 IMPLANT
SPONGE NEURO XRAY DETECT 1X3 (DISPOSABLE) ×2 IMPLANT
STRAP BODY AND KNEE 60X3 (MISCELLANEOUS) ×2 IMPLANT
SUT CHROMIC 3-0 (SUTURE) ×2
SUT CHROMIC 3-0 KS 27XMFL CR (SUTURE) ×1
SUT ETHILON 3-0 KS 30 BLK (SUTURE) ×2 IMPLANT
SUT PLAIN GUT 4-0 (SUTURE) IMPLANT
SUTURE CHRMC 3-0 KS 27XMFL CR (SUTURE) ×1 IMPLANT
SYR 10ML LL (SYRINGE) ×2 IMPLANT
TOWEL OR 17X26 4PK STRL BLUE (TOWEL DISPOSABLE) ×2 IMPLANT
WATER STERILE IRR 250ML POUR (IV SOLUTION) ×2 IMPLANT

## 2020-05-17 NOTE — Op Note (Signed)
PREOPERATIVE DIAGNOSIS:  Chronic nasal obstruction.  POSTOPERATIVE DIAGNOSIS:  Chronic nasal obstruction.  SURGEON:  Roena Malady, M.D.  NAME OF PROCEDURE:  1. Nasal septoplasty. 2. Submucous resection of inferior turbinates.  OPERATIVE FINDINGS:  Severe nasal septal deformity, hypertrophy of the inferior turbinates.   DESCRIPTION OF THE PROCEDURE:  Darlene Delacruz was identified in the holding area and taken to the operating room and placed in the supine position.  After general endotracheal anesthesia was induced, the table was turned 45 degrees and the patient was placed in a semi-Fowler position.  The nose was then topically anesthetized with Lidocaine, cotton pledgets were placed within each nostril. After approximately 5 minutes, this was removed at which time a local anesthetic of 1% Lidocaine 1:100,000 units of Epinephrine was used to inject the inferior turbinates in the nasal septum. A total of 12 ml was used. Examination of the nose showed a severe left nasal septal deformity and tremendous hypertrophied inferior turbinate.  Beginning on the right hand side a hemitransfixion incision was then created on the leading edge of the septum on the right.  A subperichondrial plane was elevated posteriorly on the left and taken back to the perpendicular plate of the ethmoid where subperiosteal plane was elevated posteriorly on the left. A large septal spur was identified on the left hand side impacting on the inferior turbinate.  An inferior rim of cartilage was removed anteriorly with care taken to leave an anterior strut to prevent nasal collapse. With this strut removed the perpendicular plate of the ethmoid was separated from the quadrangular cartilage. The large septal spur was removed.  The septum was then replaced in the midline. Reinspection through each nostril showed excellent reduction of the septal deformity. A left posterior inferior fenestration was then created to allow hematoma  drainage.  With the septoplasty completed, beginning on the left-hand side, a 15 blade was used to incise along the inferior edge of the inferior turbinate. A superior laterally based flap was then elevated. The underlying conchal bone of mucosa was excised using Knight scissors. The flap was then laid back over the turbinate stump and cauterized using suction cautery. In a similar fashion the submucous resection was performed on the right.  With the submucous resection completed bilaterally and no active bleeding, the hemitransfixion incision was then closed using two interrupted 3-0 chromic sutures.  Plastic nasal septal splints were placed within each nostril and affixed to the septum using a 3-0 nylon suture. Stammberger was then used beneath each inferior turbinate for hemostasis.    The patient tolerated the procedure well, was returned to anesthesia, extubated in the operating room, and taken to the recovery room in stable condition.    CULTURES:  None.  SPECIMENS:  None.  ESTIMATED BLOOD LOSS:  25 cc.  Roena Malady  05/17/2020  12:25 PM

## 2020-05-17 NOTE — Anesthesia Procedure Notes (Signed)
Procedure Name: Intubation Date/Time: 05/17/2020 11:52 AM Performed by: Mayme Genta, CRNA Pre-anesthesia Checklist: Patient identified, Emergency Drugs available, Suction available, Patient being monitored and Timeout performed Patient Re-evaluated:Patient Re-evaluated prior to induction Oxygen Delivery Method: Circle system utilized Preoxygenation: Pre-oxygenation with 100% oxygen Induction Type: IV induction Ventilation: Mask ventilation without difficulty Laryngoscope Size: Miller and 2 Grade View: Grade I Tube type: Oral Rae Tube size: 7.0 mm Number of attempts: 1 Placement Confirmation: ETT inserted through vocal cords under direct vision,  positive ETCO2 and breath sounds checked- equal and bilateral Tube secured with: Tape Dental Injury: Teeth and Oropharynx as per pre-operative assessment

## 2020-05-17 NOTE — Anesthesia Preprocedure Evaluation (Addendum)
Anesthesia Evaluation  Patient identified by MRN, date of birth, ID band Patient awake    Reviewed: Allergy & Precautions, H&P , NPO status , Patient's Chart, lab work & pertinent test results  Airway Mallampati: II  TM Distance: >3 FB Neck ROM: full    Dental no notable dental hx.    Pulmonary former smoker,    Pulmonary exam normal breath sounds clear to auscultation       Cardiovascular Normal cardiovascular exam+ Valvular Problems/Murmurs AS  Rhythm:regular Rate:Normal  Mild AS   Neuro/Psych PSYCHIATRIC DISORDERS    GI/Hepatic GERD  ,(+) Cirrhosis       ,   Endo/Other    Renal/GU      Musculoskeletal   Abdominal   Peds  Hematology   Anesthesia Other Findings   Reproductive/Obstetrics                            Anesthesia Physical Anesthesia Plan  ASA: III  Anesthesia Plan: General ETT   Post-op Pain Management:    Induction:   PONV Risk Score and Plan: 3 and Treatment may vary due to age or medical condition, Ondansetron, Dexamethasone and Midazolam  Airway Management Planned: Oral ETT  Additional Equipment:   Intra-op Plan:   Post-operative Plan:   Informed Consent: I have reviewed the patients History and Physical, chart, labs and discussed the procedure including the risks, benefits and alternatives for the proposed anesthesia with the patient or authorized representative who has indicated his/her understanding and acceptance.     Dental Advisory Given  Plan Discussed with: CRNA  Anesthesia Plan Comments: (H/o hepatic encephalopathy by records, but patient adds h/o multiple admissions around that time that include pneumonia, which we have no records of.  Reviewed most recent labs from OSH which show a persistent thrombocytopenia of 90-100, and mildly decreased Albumin of 3.3.  In absence of ascites, and no history of easy bleeding, it appears hepatic synthetic  function is currently sufficient for normal coagulation, although we have no direct measure.  Discussed with Dr. Tami Ribas, and curbside consult with Dr. Allen Norris, we agreed to proceed with the procedure.)        Anesthesia Quick Evaluation

## 2020-05-17 NOTE — H&P (Signed)
The patient's history has been reviewed, patient examined, no change in status, stable for surgery.  Questions were answered to the patients satisfaction.  

## 2020-05-17 NOTE — Transfer of Care (Signed)
Immediate Anesthesia Transfer of Care Note  Patient: Darlene Delacruz  Procedure(s) Performed: NASAL SEPTOPLASTY WITH TURBINATE REDUCTION (Bilateral Nose)  Patient Location: PACU  Anesthesia Type: General ETT  Level of Consciousness: awake, alert  and patient cooperative  Airway and Oxygen Therapy: Patient Spontanous Breathing and Patient connected to supplemental oxygen  Post-op Assessment: Post-op Vital signs reviewed, Patient's Cardiovascular Status Stable, Respiratory Function Stable, Patent Airway and No signs of Nausea or vomiting  Post-op Vital Signs: Reviewed and stable  Complications: No complications documented.

## 2020-05-17 NOTE — Anesthesia Postprocedure Evaluation (Signed)
Anesthesia Post Note  Patient: Darlene Delacruz  Procedure(s) Performed: NASAL SEPTOPLASTY WITH TURBINATE REDUCTION (Bilateral Nose)     Patient location during evaluation: PACU Anesthesia Type: General Level of consciousness: awake and alert and oriented Pain management: satisfactory to patient Vital Signs Assessment: post-procedure vital signs reviewed and stable Respiratory status: spontaneous breathing, nonlabored ventilation and respiratory function stable Cardiovascular status: blood pressure returned to baseline and stable Postop Assessment: Adequate PO intake and No signs of nausea or vomiting Anesthetic complications: no   No complications documented.  Raliegh Ip

## 2020-05-20 ENCOUNTER — Encounter: Payer: Self-pay | Admitting: Unknown Physician Specialty

## 2020-06-25 DIAGNOSIS — R0981 Nasal congestion: Secondary | ICD-10-CM | POA: Diagnosis not present

## 2020-07-01 DIAGNOSIS — R05 Cough: Secondary | ICD-10-CM | POA: Diagnosis not present

## 2020-07-01 DIAGNOSIS — R0981 Nasal congestion: Secondary | ICD-10-CM | POA: Diagnosis not present

## 2020-07-01 DIAGNOSIS — J90 Pleural effusion, not elsewhere classified: Secondary | ICD-10-CM | POA: Diagnosis not present

## 2020-08-15 DIAGNOSIS — M8589 Other specified disorders of bone density and structure, multiple sites: Secondary | ICD-10-CM | POA: Diagnosis not present

## 2020-08-15 DIAGNOSIS — E559 Vitamin D deficiency, unspecified: Secondary | ICD-10-CM | POA: Diagnosis not present

## 2020-08-15 DIAGNOSIS — D696 Thrombocytopenia, unspecified: Secondary | ICD-10-CM | POA: Diagnosis not present

## 2020-08-15 DIAGNOSIS — K7031 Alcoholic cirrhosis of liver with ascites: Secondary | ICD-10-CM | POA: Diagnosis not present

## 2020-08-16 DIAGNOSIS — Z20828 Contact with and (suspected) exposure to other viral communicable diseases: Secondary | ICD-10-CM | POA: Diagnosis not present

## 2020-08-22 DIAGNOSIS — R0981 Nasal congestion: Secondary | ICD-10-CM | POA: Diagnosis not present

## 2020-08-22 DIAGNOSIS — D709 Neutropenia, unspecified: Secondary | ICD-10-CM | POA: Diagnosis not present

## 2020-08-22 DIAGNOSIS — R7309 Other abnormal glucose: Secondary | ICD-10-CM | POA: Diagnosis not present

## 2020-08-22 DIAGNOSIS — R1030 Lower abdominal pain, unspecified: Secondary | ICD-10-CM | POA: Diagnosis not present

## 2020-08-22 DIAGNOSIS — E559 Vitamin D deficiency, unspecified: Secondary | ICD-10-CM | POA: Diagnosis not present

## 2020-08-22 DIAGNOSIS — D696 Thrombocytopenia, unspecified: Secondary | ICD-10-CM | POA: Diagnosis not present

## 2020-08-22 DIAGNOSIS — R222 Localized swelling, mass and lump, trunk: Secondary | ICD-10-CM | POA: Diagnosis not present

## 2020-08-22 DIAGNOSIS — K7031 Alcoholic cirrhosis of liver with ascites: Secondary | ICD-10-CM | POA: Diagnosis not present

## 2020-08-22 DIAGNOSIS — R635 Abnormal weight gain: Secondary | ICD-10-CM | POA: Diagnosis not present

## 2020-08-22 DIAGNOSIS — E785 Hyperlipidemia, unspecified: Secondary | ICD-10-CM | POA: Diagnosis not present

## 2020-08-29 DIAGNOSIS — K760 Fatty (change of) liver, not elsewhere classified: Secondary | ICD-10-CM | POA: Diagnosis not present

## 2020-08-29 DIAGNOSIS — Z23 Encounter for immunization: Secondary | ICD-10-CM | POA: Diagnosis not present

## 2020-08-29 DIAGNOSIS — K8020 Calculus of gallbladder without cholecystitis without obstruction: Secondary | ICD-10-CM | POA: Diagnosis not present

## 2020-08-29 DIAGNOSIS — R222 Localized swelling, mass and lump, trunk: Secondary | ICD-10-CM | POA: Diagnosis not present

## 2020-08-30 ENCOUNTER — Other Ambulatory Visit: Payer: Self-pay | Admitting: Family Medicine

## 2020-08-30 DIAGNOSIS — R222 Localized swelling, mass and lump, trunk: Secondary | ICD-10-CM

## 2020-09-12 ENCOUNTER — Other Ambulatory Visit: Payer: Self-pay | Admitting: Family Medicine

## 2020-09-12 ENCOUNTER — Ambulatory Visit
Admission: RE | Admit: 2020-09-12 | Discharge: 2020-09-12 | Disposition: A | Discharge status: Home / Self Care | Payer: Medicare Other | Source: Ambulatory Visit | Attending: Family Medicine | Admitting: Family Medicine

## 2020-09-12 DIAGNOSIS — K7460 Unspecified cirrhosis of liver: Secondary | ICD-10-CM | POA: Diagnosis not present

## 2020-09-12 DIAGNOSIS — K8020 Calculus of gallbladder without cholecystitis without obstruction: Secondary | ICD-10-CM | POA: Diagnosis not present

## 2020-09-12 DIAGNOSIS — I700 Atherosclerosis of aorta: Secondary | ICD-10-CM | POA: Diagnosis not present

## 2020-09-12 DIAGNOSIS — R222 Localized swelling, mass and lump, trunk: Secondary | ICD-10-CM

## 2020-09-12 DIAGNOSIS — K5730 Diverticulosis of large intestine without perforation or abscess without bleeding: Secondary | ICD-10-CM | POA: Diagnosis not present

## 2020-09-12 MED ORDER — IOPAMIDOL (ISOVUE-300) INJECTION 61%
100.0000 mL | Freq: Once | INTRAVENOUS | Status: AC | PRN
  Administered 2020-09-12: 100 mL via INTRAVENOUS

## 2020-09-14 DIAGNOSIS — Z23 Encounter for immunization: Secondary | ICD-10-CM | POA: Diagnosis not present

## 2020-09-30 DIAGNOSIS — R0602 Shortness of breath: Secondary | ICD-10-CM | POA: Diagnosis not present

## 2020-09-30 DIAGNOSIS — R42 Dizziness and giddiness: Secondary | ICD-10-CM | POA: Diagnosis not present

## 2020-10-14 DIAGNOSIS — R42 Dizziness and giddiness: Secondary | ICD-10-CM | POA: Diagnosis not present

## 2020-10-14 DIAGNOSIS — R0602 Shortness of breath: Secondary | ICD-10-CM | POA: Diagnosis not present

## 2020-10-14 DIAGNOSIS — J42 Unspecified chronic bronchitis: Secondary | ICD-10-CM | POA: Diagnosis not present

## 2020-10-30 DIAGNOSIS — H02834 Dermatochalasis of left upper eyelid: Secondary | ICD-10-CM | POA: Diagnosis not present

## 2020-10-30 DIAGNOSIS — H02831 Dermatochalasis of right upper eyelid: Secondary | ICD-10-CM | POA: Diagnosis not present

## 2020-11-04 DIAGNOSIS — L821 Other seborrheic keratosis: Secondary | ICD-10-CM | POA: Diagnosis not present

## 2020-11-04 DIAGNOSIS — D1801 Hemangioma of skin and subcutaneous tissue: Secondary | ICD-10-CM | POA: Diagnosis not present

## 2020-11-04 DIAGNOSIS — L814 Other melanin hyperpigmentation: Secondary | ICD-10-CM | POA: Diagnosis not present

## 2020-11-04 DIAGNOSIS — D229 Melanocytic nevi, unspecified: Secondary | ICD-10-CM | POA: Diagnosis not present

## 2020-11-18 DIAGNOSIS — R2689 Other abnormalities of gait and mobility: Secondary | ICD-10-CM | POA: Diagnosis not present

## 2020-11-18 DIAGNOSIS — I10 Essential (primary) hypertension: Secondary | ICD-10-CM | POA: Diagnosis not present

## 2020-11-18 DIAGNOSIS — R42 Dizziness and giddiness: Secondary | ICD-10-CM | POA: Diagnosis not present

## 2020-11-19 ENCOUNTER — Other Ambulatory Visit: Payer: Self-pay | Admitting: Family Medicine

## 2020-11-19 DIAGNOSIS — R42 Dizziness and giddiness: Secondary | ICD-10-CM

## 2020-11-19 DIAGNOSIS — R2689 Other abnormalities of gait and mobility: Secondary | ICD-10-CM

## 2020-12-11 ENCOUNTER — Other Ambulatory Visit: Payer: Medicare Other

## 2020-12-18 DIAGNOSIS — Z20828 Contact with and (suspected) exposure to other viral communicable diseases: Secondary | ICD-10-CM | POA: Diagnosis not present

## 2020-12-20 DIAGNOSIS — Z20828 Contact with and (suspected) exposure to other viral communicable diseases: Secondary | ICD-10-CM | POA: Diagnosis not present

## 2021-01-13 ENCOUNTER — Other Ambulatory Visit: Payer: Self-pay | Admitting: Family Medicine

## 2021-01-13 DIAGNOSIS — Z1231 Encounter for screening mammogram for malignant neoplasm of breast: Secondary | ICD-10-CM

## 2021-01-31 ENCOUNTER — Other Ambulatory Visit: Payer: Self-pay

## 2021-01-31 ENCOUNTER — Ambulatory Visit
Admission: RE | Admit: 2021-01-31 | Discharge: 2021-01-31 | Disposition: A | Discharge status: Home / Self Care | Payer: Medicare Other | Source: Ambulatory Visit | Attending: Family Medicine | Admitting: Family Medicine

## 2021-01-31 DIAGNOSIS — Z1231 Encounter for screening mammogram for malignant neoplasm of breast: Secondary | ICD-10-CM | POA: Diagnosis not present

## 2021-02-12 ENCOUNTER — Inpatient Hospital Stay
Admission: RE | Admit: 2021-02-12 | Discharge: 2021-02-12 | Disposition: A | Discharge status: Home / Self Care | Payer: Self-pay | Source: Ambulatory Visit | Attending: *Deleted | Admitting: *Deleted

## 2021-02-12 ENCOUNTER — Other Ambulatory Visit: Payer: Self-pay | Admitting: *Deleted

## 2021-02-12 DIAGNOSIS — Z1231 Encounter for screening mammogram for malignant neoplasm of breast: Secondary | ICD-10-CM

## 2021-02-19 DIAGNOSIS — D696 Thrombocytopenia, unspecified: Secondary | ICD-10-CM | POA: Diagnosis not present

## 2021-02-19 DIAGNOSIS — E559 Vitamin D deficiency, unspecified: Secondary | ICD-10-CM | POA: Diagnosis not present

## 2021-02-19 DIAGNOSIS — K7031 Alcoholic cirrhosis of liver with ascites: Secondary | ICD-10-CM | POA: Diagnosis not present

## 2021-02-19 DIAGNOSIS — R7309 Other abnormal glucose: Secondary | ICD-10-CM | POA: Diagnosis not present

## 2021-02-19 DIAGNOSIS — D709 Neutropenia, unspecified: Secondary | ICD-10-CM | POA: Diagnosis not present

## 2021-02-19 DIAGNOSIS — R635 Abnormal weight gain: Secondary | ICD-10-CM | POA: Diagnosis not present

## 2021-02-19 DIAGNOSIS — E785 Hyperlipidemia, unspecified: Secondary | ICD-10-CM | POA: Diagnosis not present

## 2021-02-26 DIAGNOSIS — K7031 Alcoholic cirrhosis of liver with ascites: Secondary | ICD-10-CM | POA: Diagnosis not present

## 2021-02-26 DIAGNOSIS — K7030 Alcoholic cirrhosis of liver without ascites: Secondary | ICD-10-CM | POA: Diagnosis not present

## 2021-02-26 DIAGNOSIS — E559 Vitamin D deficiency, unspecified: Secondary | ICD-10-CM | POA: Diagnosis not present

## 2021-02-26 DIAGNOSIS — D696 Thrombocytopenia, unspecified: Secondary | ICD-10-CM | POA: Diagnosis not present

## 2021-02-26 DIAGNOSIS — I10 Essential (primary) hypertension: Secondary | ICD-10-CM | POA: Diagnosis not present

## 2021-02-26 DIAGNOSIS — Z0000 Encounter for general adult medical examination without abnormal findings: Secondary | ICD-10-CM | POA: Diagnosis not present

## 2021-02-26 DIAGNOSIS — D709 Neutropenia, unspecified: Secondary | ICD-10-CM | POA: Diagnosis not present

## 2021-04-02 DIAGNOSIS — Z23 Encounter for immunization: Secondary | ICD-10-CM | POA: Diagnosis not present

## 2021-05-12 DIAGNOSIS — R0989 Other specified symptoms and signs involving the circulatory and respiratory systems: Secondary | ICD-10-CM | POA: Diagnosis not present

## 2021-05-12 DIAGNOSIS — I700 Atherosclerosis of aorta: Secondary | ICD-10-CM | POA: Diagnosis not present

## 2021-05-12 DIAGNOSIS — Z8616 Personal history of COVID-19: Secondary | ICD-10-CM | POA: Diagnosis not present

## 2021-05-26 DIAGNOSIS — J019 Acute sinusitis, unspecified: Secondary | ICD-10-CM | POA: Diagnosis not present

## 2021-05-26 DIAGNOSIS — R5383 Other fatigue: Secondary | ICD-10-CM | POA: Diagnosis not present

## 2021-05-26 DIAGNOSIS — H6123 Impacted cerumen, bilateral: Secondary | ICD-10-CM | POA: Diagnosis not present

## 2021-05-26 DIAGNOSIS — E559 Vitamin D deficiency, unspecified: Secondary | ICD-10-CM | POA: Diagnosis not present

## 2021-05-26 DIAGNOSIS — D696 Thrombocytopenia, unspecified: Secondary | ICD-10-CM | POA: Diagnosis not present

## 2021-05-26 DIAGNOSIS — K7031 Alcoholic cirrhosis of liver with ascites: Secondary | ICD-10-CM | POA: Diagnosis not present

## 2021-05-26 DIAGNOSIS — D709 Neutropenia, unspecified: Secondary | ICD-10-CM | POA: Diagnosis not present

## 2021-05-26 DIAGNOSIS — Z8616 Personal history of COVID-19: Secondary | ICD-10-CM | POA: Diagnosis not present

## 2021-05-26 DIAGNOSIS — R0981 Nasal congestion: Secondary | ICD-10-CM | POA: Diagnosis not present

## 2021-06-16 DIAGNOSIS — J019 Acute sinusitis, unspecified: Secondary | ICD-10-CM | POA: Diagnosis not present

## 2021-08-26 DIAGNOSIS — E559 Vitamin D deficiency, unspecified: Secondary | ICD-10-CM | POA: Diagnosis not present

## 2021-08-26 DIAGNOSIS — D696 Thrombocytopenia, unspecified: Secondary | ICD-10-CM | POA: Diagnosis not present

## 2021-08-26 DIAGNOSIS — I10 Essential (primary) hypertension: Secondary | ICD-10-CM | POA: Diagnosis not present

## 2021-09-02 DIAGNOSIS — R7989 Other specified abnormal findings of blood chemistry: Secondary | ICD-10-CM | POA: Diagnosis not present

## 2021-09-02 DIAGNOSIS — D696 Thrombocytopenia, unspecified: Secondary | ICD-10-CM | POA: Diagnosis not present

## 2021-09-02 DIAGNOSIS — R7309 Other abnormal glucose: Secondary | ICD-10-CM | POA: Diagnosis not present

## 2021-09-02 DIAGNOSIS — D709 Neutropenia, unspecified: Secondary | ICD-10-CM | POA: Diagnosis not present

## 2021-09-02 DIAGNOSIS — Z23 Encounter for immunization: Secondary | ICD-10-CM | POA: Diagnosis not present

## 2021-09-02 DIAGNOSIS — I700 Atherosclerosis of aorta: Secondary | ICD-10-CM | POA: Diagnosis not present

## 2021-09-02 DIAGNOSIS — E785 Hyperlipidemia, unspecified: Secondary | ICD-10-CM | POA: Diagnosis not present

## 2021-09-02 DIAGNOSIS — K7030 Alcoholic cirrhosis of liver without ascites: Secondary | ICD-10-CM | POA: Diagnosis not present

## 2021-09-02 DIAGNOSIS — E559 Vitamin D deficiency, unspecified: Secondary | ICD-10-CM | POA: Diagnosis not present

## 2021-09-02 DIAGNOSIS — I10 Essential (primary) hypertension: Secondary | ICD-10-CM | POA: Diagnosis not present

## 2021-09-17 DIAGNOSIS — Z23 Encounter for immunization: Secondary | ICD-10-CM | POA: Diagnosis not present

## 2021-10-08 DIAGNOSIS — M13812 Other specified arthritis, left shoulder: Secondary | ICD-10-CM | POA: Diagnosis not present

## 2021-10-08 DIAGNOSIS — M25512 Pain in left shoulder: Secondary | ICD-10-CM | POA: Diagnosis not present

## 2021-10-08 DIAGNOSIS — S40012A Contusion of left shoulder, initial encounter: Secondary | ICD-10-CM | POA: Diagnosis not present

## 2021-10-15 DIAGNOSIS — S42202A Unspecified fracture of upper end of left humerus, initial encounter for closed fracture: Secondary | ICD-10-CM | POA: Diagnosis not present

## 2021-10-16 ENCOUNTER — Other Ambulatory Visit (HOSPITAL_BASED_OUTPATIENT_CLINIC_OR_DEPARTMENT_OTHER): Payer: Self-pay | Admitting: Orthopaedic Surgery

## 2021-10-16 ENCOUNTER — Other Ambulatory Visit: Payer: Self-pay | Admitting: Orthopaedic Surgery

## 2021-10-16 DIAGNOSIS — S42202A Unspecified fracture of upper end of left humerus, initial encounter for closed fracture: Secondary | ICD-10-CM

## 2021-10-16 DIAGNOSIS — L821 Other seborrheic keratosis: Secondary | ICD-10-CM | POA: Diagnosis not present

## 2021-10-16 DIAGNOSIS — D1801 Hemangioma of skin and subcutaneous tissue: Secondary | ICD-10-CM | POA: Diagnosis not present

## 2021-10-20 DIAGNOSIS — S42252D Displaced fracture of greater tuberosity of left humerus, subsequent encounter for fracture with routine healing: Secondary | ICD-10-CM | POA: Diagnosis not present

## 2021-10-20 DIAGNOSIS — S40012D Contusion of left shoulder, subsequent encounter: Secondary | ICD-10-CM | POA: Diagnosis not present

## 2021-10-20 DIAGNOSIS — M6281 Muscle weakness (generalized): Secondary | ICD-10-CM | POA: Diagnosis not present

## 2021-10-20 DIAGNOSIS — M25512 Pain in left shoulder: Secondary | ICD-10-CM | POA: Diagnosis not present

## 2021-10-22 DIAGNOSIS — S42252D Displaced fracture of greater tuberosity of left humerus, subsequent encounter for fracture with routine healing: Secondary | ICD-10-CM | POA: Diagnosis not present

## 2021-10-22 DIAGNOSIS — M6281 Muscle weakness (generalized): Secondary | ICD-10-CM | POA: Diagnosis not present

## 2021-10-22 DIAGNOSIS — M25512 Pain in left shoulder: Secondary | ICD-10-CM | POA: Diagnosis not present

## 2021-10-22 DIAGNOSIS — S40012D Contusion of left shoulder, subsequent encounter: Secondary | ICD-10-CM | POA: Diagnosis not present

## 2021-10-23 ENCOUNTER — Ambulatory Visit
Admission: RE | Admit: 2021-10-23 | Discharge: 2021-10-23 | Disposition: A | Discharge status: Home / Self Care | Payer: Medicare Other | Source: Ambulatory Visit | Attending: Orthopaedic Surgery | Admitting: Orthopaedic Surgery

## 2021-10-23 ENCOUNTER — Other Ambulatory Visit: Payer: Self-pay | Admitting: Orthopaedic Surgery

## 2021-10-23 ENCOUNTER — Other Ambulatory Visit: Payer: Self-pay

## 2021-10-23 DIAGNOSIS — S42202A Unspecified fracture of upper end of left humerus, initial encounter for closed fracture: Secondary | ICD-10-CM | POA: Diagnosis not present

## 2021-10-23 DIAGNOSIS — R937 Abnormal findings on diagnostic imaging of other parts of musculoskeletal system: Secondary | ICD-10-CM | POA: Diagnosis not present

## 2021-10-23 DIAGNOSIS — S42212A Unspecified displaced fracture of surgical neck of left humerus, initial encounter for closed fracture: Secondary | ICD-10-CM | POA: Diagnosis not present

## 2021-10-23 DIAGNOSIS — M25512 Pain in left shoulder: Secondary | ICD-10-CM | POA: Diagnosis not present

## 2021-10-27 DIAGNOSIS — S42252D Displaced fracture of greater tuberosity of left humerus, subsequent encounter for fracture with routine healing: Secondary | ICD-10-CM | POA: Diagnosis not present

## 2021-10-27 DIAGNOSIS — S40012D Contusion of left shoulder, subsequent encounter: Secondary | ICD-10-CM | POA: Diagnosis not present

## 2021-10-27 DIAGNOSIS — M25512 Pain in left shoulder: Secondary | ICD-10-CM | POA: Diagnosis not present

## 2021-10-27 DIAGNOSIS — M6281 Muscle weakness (generalized): Secondary | ICD-10-CM | POA: Diagnosis not present

## 2021-10-28 DIAGNOSIS — S42252D Displaced fracture of greater tuberosity of left humerus, subsequent encounter for fracture with routine healing: Secondary | ICD-10-CM | POA: Diagnosis not present

## 2021-10-28 DIAGNOSIS — M6281 Muscle weakness (generalized): Secondary | ICD-10-CM | POA: Diagnosis not present

## 2021-10-28 DIAGNOSIS — M25512 Pain in left shoulder: Secondary | ICD-10-CM | POA: Diagnosis not present

## 2021-10-28 DIAGNOSIS — S40012D Contusion of left shoulder, subsequent encounter: Secondary | ICD-10-CM | POA: Diagnosis not present

## 2021-11-03 DIAGNOSIS — M6281 Muscle weakness (generalized): Secondary | ICD-10-CM | POA: Diagnosis not present

## 2021-11-03 DIAGNOSIS — S40012D Contusion of left shoulder, subsequent encounter: Secondary | ICD-10-CM | POA: Diagnosis not present

## 2021-11-03 DIAGNOSIS — S42252D Displaced fracture of greater tuberosity of left humerus, subsequent encounter for fracture with routine healing: Secondary | ICD-10-CM | POA: Diagnosis not present

## 2021-11-03 DIAGNOSIS — M25512 Pain in left shoulder: Secondary | ICD-10-CM | POA: Diagnosis not present

## 2021-11-06 DIAGNOSIS — M25512 Pain in left shoulder: Secondary | ICD-10-CM | POA: Diagnosis not present

## 2021-11-06 DIAGNOSIS — S42252D Displaced fracture of greater tuberosity of left humerus, subsequent encounter for fracture with routine healing: Secondary | ICD-10-CM | POA: Diagnosis not present

## 2021-11-06 DIAGNOSIS — M6281 Muscle weakness (generalized): Secondary | ICD-10-CM | POA: Diagnosis not present

## 2021-11-06 DIAGNOSIS — S40012D Contusion of left shoulder, subsequent encounter: Secondary | ICD-10-CM | POA: Diagnosis not present

## 2021-11-11 DIAGNOSIS — S42252D Displaced fracture of greater tuberosity of left humerus, subsequent encounter for fracture with routine healing: Secondary | ICD-10-CM | POA: Diagnosis not present

## 2021-11-11 DIAGNOSIS — M6281 Muscle weakness (generalized): Secondary | ICD-10-CM | POA: Diagnosis not present

## 2021-11-11 DIAGNOSIS — S40012D Contusion of left shoulder, subsequent encounter: Secondary | ICD-10-CM | POA: Diagnosis not present

## 2021-11-11 DIAGNOSIS — M25512 Pain in left shoulder: Secondary | ICD-10-CM | POA: Diagnosis not present

## 2021-11-13 DIAGNOSIS — S40012D Contusion of left shoulder, subsequent encounter: Secondary | ICD-10-CM | POA: Diagnosis not present

## 2021-11-13 DIAGNOSIS — M25512 Pain in left shoulder: Secondary | ICD-10-CM | POA: Diagnosis not present

## 2021-11-13 DIAGNOSIS — S42252D Displaced fracture of greater tuberosity of left humerus, subsequent encounter for fracture with routine healing: Secondary | ICD-10-CM | POA: Diagnosis not present

## 2021-11-13 DIAGNOSIS — S42202A Unspecified fracture of upper end of left humerus, initial encounter for closed fracture: Secondary | ICD-10-CM | POA: Diagnosis not present

## 2021-11-13 DIAGNOSIS — M6281 Muscle weakness (generalized): Secondary | ICD-10-CM | POA: Diagnosis not present

## 2021-11-18 DIAGNOSIS — M6281 Muscle weakness (generalized): Secondary | ICD-10-CM | POA: Diagnosis not present

## 2021-11-18 DIAGNOSIS — S40012D Contusion of left shoulder, subsequent encounter: Secondary | ICD-10-CM | POA: Diagnosis not present

## 2021-11-18 DIAGNOSIS — M25512 Pain in left shoulder: Secondary | ICD-10-CM | POA: Diagnosis not present

## 2021-11-18 DIAGNOSIS — S42252D Displaced fracture of greater tuberosity of left humerus, subsequent encounter for fracture with routine healing: Secondary | ICD-10-CM | POA: Diagnosis not present

## 2021-11-21 DIAGNOSIS — S40012D Contusion of left shoulder, subsequent encounter: Secondary | ICD-10-CM | POA: Diagnosis not present

## 2021-11-21 DIAGNOSIS — M25512 Pain in left shoulder: Secondary | ICD-10-CM | POA: Diagnosis not present

## 2021-11-21 DIAGNOSIS — S42252D Displaced fracture of greater tuberosity of left humerus, subsequent encounter for fracture with routine healing: Secondary | ICD-10-CM | POA: Diagnosis not present

## 2021-11-21 DIAGNOSIS — M6281 Muscle weakness (generalized): Secondary | ICD-10-CM | POA: Diagnosis not present

## 2021-11-25 DIAGNOSIS — M25512 Pain in left shoulder: Secondary | ICD-10-CM | POA: Diagnosis not present

## 2021-11-25 DIAGNOSIS — S40012D Contusion of left shoulder, subsequent encounter: Secondary | ICD-10-CM | POA: Diagnosis not present

## 2021-11-25 DIAGNOSIS — S42252D Displaced fracture of greater tuberosity of left humerus, subsequent encounter for fracture with routine healing: Secondary | ICD-10-CM | POA: Diagnosis not present

## 2021-11-25 DIAGNOSIS — M6281 Muscle weakness (generalized): Secondary | ICD-10-CM | POA: Diagnosis not present

## 2021-11-27 DIAGNOSIS — M6281 Muscle weakness (generalized): Secondary | ICD-10-CM | POA: Diagnosis not present

## 2021-11-27 DIAGNOSIS — S42252D Displaced fracture of greater tuberosity of left humerus, subsequent encounter for fracture with routine healing: Secondary | ICD-10-CM | POA: Diagnosis not present

## 2021-11-27 DIAGNOSIS — S40012D Contusion of left shoulder, subsequent encounter: Secondary | ICD-10-CM | POA: Diagnosis not present

## 2021-11-27 DIAGNOSIS — M25512 Pain in left shoulder: Secondary | ICD-10-CM | POA: Diagnosis not present

## 2021-12-02 DIAGNOSIS — M6281 Muscle weakness (generalized): Secondary | ICD-10-CM | POA: Diagnosis not present

## 2021-12-02 DIAGNOSIS — S42252D Displaced fracture of greater tuberosity of left humerus, subsequent encounter for fracture with routine healing: Secondary | ICD-10-CM | POA: Diagnosis not present

## 2021-12-02 DIAGNOSIS — M25512 Pain in left shoulder: Secondary | ICD-10-CM | POA: Diagnosis not present

## 2021-12-02 DIAGNOSIS — S40012D Contusion of left shoulder, subsequent encounter: Secondary | ICD-10-CM | POA: Diagnosis not present

## 2021-12-04 DIAGNOSIS — M25512 Pain in left shoulder: Secondary | ICD-10-CM | POA: Diagnosis not present

## 2021-12-04 DIAGNOSIS — M6281 Muscle weakness (generalized): Secondary | ICD-10-CM | POA: Diagnosis not present

## 2021-12-04 DIAGNOSIS — S42252D Displaced fracture of greater tuberosity of left humerus, subsequent encounter for fracture with routine healing: Secondary | ICD-10-CM | POA: Diagnosis not present

## 2021-12-04 DIAGNOSIS — S40012D Contusion of left shoulder, subsequent encounter: Secondary | ICD-10-CM | POA: Diagnosis not present

## 2021-12-09 DIAGNOSIS — S42252D Displaced fracture of greater tuberosity of left humerus, subsequent encounter for fracture with routine healing: Secondary | ICD-10-CM | POA: Diagnosis not present

## 2021-12-09 DIAGNOSIS — S40012D Contusion of left shoulder, subsequent encounter: Secondary | ICD-10-CM | POA: Diagnosis not present

## 2021-12-09 DIAGNOSIS — M25512 Pain in left shoulder: Secondary | ICD-10-CM | POA: Diagnosis not present

## 2021-12-09 DIAGNOSIS — M6281 Muscle weakness (generalized): Secondary | ICD-10-CM | POA: Diagnosis not present

## 2021-12-11 DIAGNOSIS — S42252D Displaced fracture of greater tuberosity of left humerus, subsequent encounter for fracture with routine healing: Secondary | ICD-10-CM | POA: Diagnosis not present

## 2021-12-11 DIAGNOSIS — M6281 Muscle weakness (generalized): Secondary | ICD-10-CM | POA: Diagnosis not present

## 2021-12-11 DIAGNOSIS — M25512 Pain in left shoulder: Secondary | ICD-10-CM | POA: Diagnosis not present

## 2021-12-11 DIAGNOSIS — S40012D Contusion of left shoulder, subsequent encounter: Secondary | ICD-10-CM | POA: Diagnosis not present

## 2021-12-15 DIAGNOSIS — S42202A Unspecified fracture of upper end of left humerus, initial encounter for closed fracture: Secondary | ICD-10-CM | POA: Diagnosis not present

## 2021-12-15 DIAGNOSIS — H6123 Impacted cerumen, bilateral: Secondary | ICD-10-CM | POA: Diagnosis not present

## 2021-12-15 DIAGNOSIS — H903 Sensorineural hearing loss, bilateral: Secondary | ICD-10-CM | POA: Diagnosis not present

## 2021-12-16 DIAGNOSIS — S42252D Displaced fracture of greater tuberosity of left humerus, subsequent encounter for fracture with routine healing: Secondary | ICD-10-CM | POA: Diagnosis not present

## 2021-12-16 DIAGNOSIS — M6281 Muscle weakness (generalized): Secondary | ICD-10-CM | POA: Diagnosis not present

## 2021-12-16 DIAGNOSIS — S40012D Contusion of left shoulder, subsequent encounter: Secondary | ICD-10-CM | POA: Diagnosis not present

## 2021-12-16 DIAGNOSIS — M25512 Pain in left shoulder: Secondary | ICD-10-CM | POA: Diagnosis not present

## 2021-12-19 DIAGNOSIS — S42252D Displaced fracture of greater tuberosity of left humerus, subsequent encounter for fracture with routine healing: Secondary | ICD-10-CM | POA: Diagnosis not present

## 2021-12-19 DIAGNOSIS — S40012D Contusion of left shoulder, subsequent encounter: Secondary | ICD-10-CM | POA: Diagnosis not present

## 2021-12-19 DIAGNOSIS — M25512 Pain in left shoulder: Secondary | ICD-10-CM | POA: Diagnosis not present

## 2021-12-19 DIAGNOSIS — M6281 Muscle weakness (generalized): Secondary | ICD-10-CM | POA: Diagnosis not present

## 2021-12-23 ENCOUNTER — Other Ambulatory Visit: Payer: Self-pay | Admitting: Family Medicine

## 2021-12-23 DIAGNOSIS — S40012D Contusion of left shoulder, subsequent encounter: Secondary | ICD-10-CM | POA: Diagnosis not present

## 2021-12-23 DIAGNOSIS — Z1231 Encounter for screening mammogram for malignant neoplasm of breast: Secondary | ICD-10-CM

## 2021-12-23 DIAGNOSIS — M25512 Pain in left shoulder: Secondary | ICD-10-CM | POA: Diagnosis not present

## 2021-12-23 DIAGNOSIS — M6281 Muscle weakness (generalized): Secondary | ICD-10-CM | POA: Diagnosis not present

## 2021-12-23 DIAGNOSIS — S42252D Displaced fracture of greater tuberosity of left humerus, subsequent encounter for fracture with routine healing: Secondary | ICD-10-CM | POA: Diagnosis not present

## 2021-12-26 DIAGNOSIS — S40012D Contusion of left shoulder, subsequent encounter: Secondary | ICD-10-CM | POA: Diagnosis not present

## 2021-12-26 DIAGNOSIS — M25512 Pain in left shoulder: Secondary | ICD-10-CM | POA: Diagnosis not present

## 2021-12-26 DIAGNOSIS — M6281 Muscle weakness (generalized): Secondary | ICD-10-CM | POA: Diagnosis not present

## 2021-12-26 DIAGNOSIS — S42252D Displaced fracture of greater tuberosity of left humerus, subsequent encounter for fracture with routine healing: Secondary | ICD-10-CM | POA: Diagnosis not present

## 2021-12-30 DIAGNOSIS — S42252D Displaced fracture of greater tuberosity of left humerus, subsequent encounter for fracture with routine healing: Secondary | ICD-10-CM | POA: Diagnosis not present

## 2021-12-30 DIAGNOSIS — M25512 Pain in left shoulder: Secondary | ICD-10-CM | POA: Diagnosis not present

## 2021-12-30 DIAGNOSIS — S40012D Contusion of left shoulder, subsequent encounter: Secondary | ICD-10-CM | POA: Diagnosis not present

## 2021-12-30 DIAGNOSIS — M6281 Muscle weakness (generalized): Secondary | ICD-10-CM | POA: Diagnosis not present

## 2022-01-01 DIAGNOSIS — M25512 Pain in left shoulder: Secondary | ICD-10-CM | POA: Diagnosis not present

## 2022-01-01 DIAGNOSIS — S40012D Contusion of left shoulder, subsequent encounter: Secondary | ICD-10-CM | POA: Diagnosis not present

## 2022-01-01 DIAGNOSIS — S42252D Displaced fracture of greater tuberosity of left humerus, subsequent encounter for fracture with routine healing: Secondary | ICD-10-CM | POA: Diagnosis not present

## 2022-01-01 DIAGNOSIS — M6281 Muscle weakness (generalized): Secondary | ICD-10-CM | POA: Diagnosis not present

## 2022-01-06 DIAGNOSIS — M6281 Muscle weakness (generalized): Secondary | ICD-10-CM | POA: Diagnosis not present

## 2022-01-06 DIAGNOSIS — S40012D Contusion of left shoulder, subsequent encounter: Secondary | ICD-10-CM | POA: Diagnosis not present

## 2022-01-06 DIAGNOSIS — M25512 Pain in left shoulder: Secondary | ICD-10-CM | POA: Diagnosis not present

## 2022-01-06 DIAGNOSIS — S42252D Displaced fracture of greater tuberosity of left humerus, subsequent encounter for fracture with routine healing: Secondary | ICD-10-CM | POA: Diagnosis not present

## 2022-01-08 DIAGNOSIS — S42252D Displaced fracture of greater tuberosity of left humerus, subsequent encounter for fracture with routine healing: Secondary | ICD-10-CM | POA: Diagnosis not present

## 2022-01-08 DIAGNOSIS — S42202A Unspecified fracture of upper end of left humerus, initial encounter for closed fracture: Secondary | ICD-10-CM | POA: Diagnosis not present

## 2022-01-08 DIAGNOSIS — S40012D Contusion of left shoulder, subsequent encounter: Secondary | ICD-10-CM | POA: Diagnosis not present

## 2022-01-08 DIAGNOSIS — M25512 Pain in left shoulder: Secondary | ICD-10-CM | POA: Diagnosis not present

## 2022-01-08 DIAGNOSIS — M6281 Muscle weakness (generalized): Secondary | ICD-10-CM | POA: Diagnosis not present

## 2022-01-13 DIAGNOSIS — M6281 Muscle weakness (generalized): Secondary | ICD-10-CM | POA: Diagnosis not present

## 2022-01-13 DIAGNOSIS — S42252D Displaced fracture of greater tuberosity of left humerus, subsequent encounter for fracture with routine healing: Secondary | ICD-10-CM | POA: Diagnosis not present

## 2022-01-13 DIAGNOSIS — S40012D Contusion of left shoulder, subsequent encounter: Secondary | ICD-10-CM | POA: Diagnosis not present

## 2022-01-13 DIAGNOSIS — M25512 Pain in left shoulder: Secondary | ICD-10-CM | POA: Diagnosis not present

## 2022-01-21 DIAGNOSIS — N760 Acute vaginitis: Secondary | ICD-10-CM | POA: Diagnosis not present

## 2022-01-21 DIAGNOSIS — R32 Unspecified urinary incontinence: Secondary | ICD-10-CM | POA: Diagnosis not present

## 2022-02-02 ENCOUNTER — Ambulatory Visit
Admission: RE | Admit: 2022-02-02 | Discharge: 2022-02-02 | Disposition: A | Discharge status: Home / Self Care | Payer: Medicare Other | Source: Ambulatory Visit | Attending: Family Medicine | Admitting: Family Medicine

## 2022-02-02 ENCOUNTER — Other Ambulatory Visit: Payer: Self-pay

## 2022-02-02 DIAGNOSIS — Z1231 Encounter for screening mammogram for malignant neoplasm of breast: Secondary | ICD-10-CM | POA: Insufficient documentation

## 2022-02-04 DIAGNOSIS — S42202A Unspecified fracture of upper end of left humerus, initial encounter for closed fracture: Secondary | ICD-10-CM | POA: Diagnosis not present

## 2022-02-23 DIAGNOSIS — E785 Hyperlipidemia, unspecified: Secondary | ICD-10-CM | POA: Diagnosis not present

## 2022-02-23 DIAGNOSIS — I10 Essential (primary) hypertension: Secondary | ICD-10-CM | POA: Diagnosis not present

## 2022-02-23 DIAGNOSIS — D709 Neutropenia, unspecified: Secondary | ICD-10-CM | POA: Diagnosis not present

## 2022-02-23 DIAGNOSIS — D696 Thrombocytopenia, unspecified: Secondary | ICD-10-CM | POA: Diagnosis not present

## 2022-02-23 DIAGNOSIS — E559 Vitamin D deficiency, unspecified: Secondary | ICD-10-CM | POA: Diagnosis not present

## 2022-02-23 DIAGNOSIS — R7309 Other abnormal glucose: Secondary | ICD-10-CM | POA: Diagnosis not present

## 2022-02-23 DIAGNOSIS — I700 Atherosclerosis of aorta: Secondary | ICD-10-CM | POA: Diagnosis not present

## 2022-02-23 DIAGNOSIS — R7989 Other specified abnormal findings of blood chemistry: Secondary | ICD-10-CM | POA: Diagnosis not present

## 2022-02-23 DIAGNOSIS — K7030 Alcoholic cirrhosis of liver without ascites: Secondary | ICD-10-CM | POA: Diagnosis not present

## 2022-03-02 DIAGNOSIS — R7303 Prediabetes: Secondary | ICD-10-CM | POA: Diagnosis not present

## 2022-03-02 DIAGNOSIS — I10 Essential (primary) hypertension: Secondary | ICD-10-CM | POA: Diagnosis not present

## 2022-03-02 DIAGNOSIS — Z23 Encounter for immunization: Secondary | ICD-10-CM | POA: Diagnosis not present

## 2022-03-02 DIAGNOSIS — D709 Neutropenia, unspecified: Secondary | ICD-10-CM | POA: Diagnosis not present

## 2022-03-02 DIAGNOSIS — K7030 Alcoholic cirrhosis of liver without ascites: Secondary | ICD-10-CM | POA: Diagnosis not present

## 2022-03-02 DIAGNOSIS — D696 Thrombocytopenia, unspecified: Secondary | ICD-10-CM | POA: Diagnosis not present

## 2022-03-02 DIAGNOSIS — Z0000 Encounter for general adult medical examination without abnormal findings: Secondary | ICD-10-CM | POA: Diagnosis not present

## 2022-03-19 ENCOUNTER — Ambulatory Visit (INDEPENDENT_AMBULATORY_CARE_PROVIDER_SITE_OTHER): Payer: Medicare Other | Admitting: Obstetrics and Gynecology

## 2022-03-19 ENCOUNTER — Encounter: Payer: Self-pay | Admitting: Obstetrics and Gynecology

## 2022-03-19 VITALS — BP 137/77 | HR 80 | Ht 63.0 in | Wt 176.0 lb

## 2022-03-19 DIAGNOSIS — N3281 Overactive bladder: Secondary | ICD-10-CM

## 2022-03-19 DIAGNOSIS — N393 Stress incontinence (female) (male): Secondary | ICD-10-CM | POA: Diagnosis not present

## 2022-03-19 MED ORDER — MIRABEGRON ER 25 MG PO TB24: 25.0000 mg | ORAL_TABLET | Freq: Every day | ORAL | 5 refills | Status: AC

## 2022-03-19 NOTE — Progress Notes (Signed)
[facility] ?New Patient Evaluation and Consultation ? ?Referring Provider: Janie Morning, DO ?PCP: Janie Morning, DO ?Date of Service: 03/19/2022 ? ?SUBJECTIVE ?Chief Complaint: Darlene Delacruz  leakage ? ?History of Present Illness: Darlene Delacruz is a 76 y.o. White or Caucasian female presenting for evaluation of incontinence.   ? ? ?Urinary Symptoms: ?Leaks urine with cough/ sneeze, with movement to the bathroom, and while asleep ?Leaks 1-2 time(s) per days. SUI > UUI ?Pad use: 2 pads per day.   ?She is bothered by her UI symptoms. ? ?Day time voids 4-5.  Nocturia: 3-4 times per night to void. ?Voiding dysfunction: she empties her bladder well.  ?does not use a catheter to empty bladder.  ?When urinating, she feels a weak stream and dribbling after finishing ?Drinks: half cup coffee, water, 1-2 glasses wine with dinner per day. Stops drinking around 8pm, goes to bed around 10:30.  ? ?UTIs: 1 UTI's in the last year.   ?Denies history of blood in urine and kidney or bladder stones ? ?Pelvic Organ Prolapse Symptoms:                  ?She Denies a feeling of a bulge the vaginal area.  ? ?Bowel Symptom: ?Bowel movements: 1 time(s) per day ?Stool consistency: soft  ?Straining: no.  ?Splinting: no.  ?Incomplete evacuation: no.  ?She Denies accidental bowel leakage / fecal incontinence ?Bowel regimen:  lactulose 10g/ 13m ? ?Sexual Function ?Sexually active: no.  ? ? ?Pelvic Pain ?Denies pelvic pain ? ?Past Medical History:  ?Past Medical History:  ?Diagnosis Date  ? [place] ([place]   ? Alcoholism ([place]   ? Arthritis   ? Family history of adverse reaction to anesthesia   ? Mother - PONV  ? GERD (gastroesophageal reflux disease)   ? Hyperlipidemia   ? Spastic esophagus   ? ? ? ?Past Surgical History:   ?Past Surgical History:  ?Procedure Laterality Date  ? BUNIONECTOMY    ? both feet  ? CARPAL TUNNEL RELEASE  10/13/2011  ? Procedure: CARPAL TUNNEL RELEASE;  Surgeon: GWynonia Sours MD;  [facility]  Service: Orthopedics;  Laterality: Right;  ? COLONOSCOPY  2018  ? Dr. MCollene Mares  ? EAR CYST EXCISION  10/13/2011  ? Procedure: CYST REMOVAL;  Surgeon: GWynonia Sours MD;  [facility]  Service: Orthopedics;  Laterality: Right;  excision cyst possible rotator flap, debridement dip right middle finger  ? left knee arthroscopy    ? NASAL SEPTOPLASTY W/ TURBINOPLASTY Bilateral 05/17/2020  ? Procedure: NASAL SEPTOPLASTY WITH TURBINATE REDUCTION;  Surgeon: MBeverly Gust MD;  [facility] [facility]  Service: ENT;  Laterality: Bilateral;  ? right rotator cuff repair    ? TOTAL LAPAROSCOPIC HYSTERECTOMY WITH BILATERAL SALPINGO OOPHORECTOMY    ? menorrhagia  ? ? ? ?Past OB/GYN History: ?OB History  ?Gravida Para Term Preterm AB Living  ?'3       1 2  '$ ?SAB IAB Ectopic Multiple Live Births  ?1       2  ?  ?# Outcome Date GA Lbr Len/2nd Weight Sex Delivery Anes PTL Lv  ?3 Gravida           ?2 Gravida           ?1 SAB           ? ? ?Vaginal deliveries: 2,  Forceps/ Vacuum deliveries: 0, Cesarean section: 0 ?S/p hysterectomy ? ? ?Medications: She has  a current medication list which includes the following prescription(s): lactulose, mirabegron er, and spironolactone.  ? ?Allergies: Patient has No Known Allergies.  ? ?Social History:  ?Social History  ? ?Tobacco Use  ? Smoking status: Former  ?  Types: Cigarettes  ?  Quit date: 04/07/1991  ?  Years since quitting: 30.9  ? Smokeless tobacco: Never  ?Vaping Use  ? Vaping Use: Never used  ?Substance Use Topics  ? Alcohol use: Yes  ?  Alcohol/week: 2.0 standard drinks  ?  Types: 2 Glasses of wine per week  ? Drug use: No  ? ? ?Relationship status: widowed ?She is not employed . ?Regular exercise: No ?History of abuse: No ? ?Family History:   ?Family History  ?Problem Relation Age of Onset  ? Heart failure Mother   ? ? ? ?Review of Systems: Review of Systems  ?Constitutional:  Negative for fever, malaise/fatigue and weight loss.  ?Respiratory:   Negative for cough, shortness of breath and wheezing.   ?Cardiovascular:  Negative for chest pain, palpitations and leg swelling.  ?Gastrointestinal:  Negative for abdominal pain and blood in stool.  ?Genitourinary:  Negative for dysuria.  ?Musculoskeletal:  Positive for myalgias.  ?Skin:  Negative for rash.  ?Neurological:  Negative for dizziness and headaches.  ?Endo/Heme/Allergies:  Does not bruise/bleed easily.  ?Psychiatric/Behavioral:  Negative for depression. The patient is not nervous/anxious.   ? ? ?OBJECTIVE ?Physical Exam: ?Vitals:  ? 03/19/22 0954  ?BP: 137/77  ?Pulse: 80  ?Weight: 176 lb (79.8 kg)  ?Height: '5\' 3"'$  (1.6 m)  ? ? ?Physical Exam ?Constitutional:   ?   General: She is not in acute distress. ?Pulmonary:  ?   Effort: Pulmonary effort is normal.  ?Abdominal:  ?   General: There is no distension.  ?   Palpations: Abdomen is soft.  ?   Tenderness: There is no abdominal tenderness. There is no rebound.  ?Musculoskeletal:     ?   General: No swelling. Normal range of motion.  ?Skin: ?   General: Skin is warm and dry.  ?   Findings: No rash.  ?Neurological:  ?   Mental Status: She is alert and oriented to person, place, and time.  ?Psychiatric:     ?   Mood and Affect: Mood normal.     ?   Behavior: Behavior normal.  ? ? ? ?GU / Detailed Urogynecologic Evaluation:  ?Pelvic Exam: Normal external female genitalia; Bartholin's and Skene's glands normal in appearance; urethral meatus normal in appearance, no urethral masses or discharge.  ? ?CST: negative ? ?s/p hysterectomy: Speculum exam reveals normal vaginal mucosa with  atrophy and normal vaginal cuff.  Adnexa no mass, fullness, tenderness.   ? ? ?Pelvic floor strength I/V ? ?Pelvic floor musculature: Right levator non-tender, Right obturator non-tender, Left levator non-tender, Left obturator non-tender ? ?POP-Q:  ? ?POP-Q ? ?-3  ?                                          Aa   ?-3 ?                                          Ba  ?-7  ?  C  ? ?3  ?                                          Gh  ?4  ?                                          Pb  ?7.5  ?                                          tvl  ? ?-2  ?                                          Ap  ?-2  ?                                          Bp  ?   ?                                            D  ? ? ? ?Rectal Exam:  ?Normal external rectum ? ?Post-Void Residual (PVR) by Bladder Scan: ?In order to evaluate bladder emptying, we discussed obtaining a postvoid residual and she agreed to this procedure. ? ?Procedure: The ultrasound unit was placed on the patient's abdomen in the suprapubic region after the patient had voided. A PVR of 60 ml was obtained by bladder scan. ? ?Laboratory Results: ?Unable to urinate to leave sample ? ?ASSESSMENT AND PLAN ?Ms. Maka is a 76 y.o. with:  ?1. Overactive bladder   ?2. SUI (stress urinary incontinence, female)   ? ?OAB ?- We discussed the symptoms of overactive bladder (OAB), which include urinary urgency, urinary frequency, nocturia, with or without urge incontinence.  While we do not know the exact etiology of OAB, several treatment options exist. We discussed management including behavioral therapy (decreasing bladder irritants, urge suppression strategies, timed voids, bladder retraining), physical therapy, medication.  ?- She is interested in physical therapy and medication. Referral placed for PT ?-Myrbetriq '25mg'$  ordered ? ?2. SUI ?-For treatment of stress urinary incontinence,  non-surgical options include expectant management, weight loss, physical therapy, as well as a pessary.  She is not interested in surgical options.  ?- She will start with physical therapy ? ?Return 6 weeks for follow up ? ?Jaquita Folds, MD ? ? ? ? ?

## 2022-03-20 ENCOUNTER — Ambulatory Visit: Payer: Medicare Other | Admitting: Obstetrics and Gynecology

## 2022-03-25 ENCOUNTER — Telehealth: Payer: Self-pay

## 2022-03-25 NOTE — Telephone Encounter (Signed)
Darlene Delacruz is a 76 y.o. female called in wanting to know why she was prescribed Myrbetriq when she has a liver condition. Pt said she was told by someone not to take the medication and to find an alternative. ?I told the member I would send a message to Dr Wannetta Sender and we will call her back tomorrow. Pt verbalized understanding. ?

## 2022-03-27 NOTE — Telephone Encounter (Signed)
Attempted to call patient but no answer.  ? ?Please call her again Monday and let her know that the medication should not interfere with her liver condition.  ? ? ?

## 2022-03-30 NOTE — Telephone Encounter (Signed)
Pt was notified but said she still would like to speak to Dr. Wannetta Sender. Pt was notified Dr.Schroeder is in surgery today and will call her when she gets back in the office ?

## 2022-03-30 NOTE — Progress Notes (Signed)
Sent message, via epic in basket, requesting orders in epic from surgeon.  

## 2022-03-31 DIAGNOSIS — D696 Thrombocytopenia, unspecified: Secondary | ICD-10-CM | POA: Diagnosis not present

## 2022-03-31 DIAGNOSIS — K7030 Alcoholic cirrhosis of liver without ascites: Secondary | ICD-10-CM | POA: Diagnosis not present

## 2022-03-31 DIAGNOSIS — D709 Neutropenia, unspecified: Secondary | ICD-10-CM | POA: Diagnosis not present

## 2022-03-31 NOTE — H&P (Signed)
?Patient's anticipated LOS is less than 2 midnights, meeting these requirements: ?- Younger than 93 ?- Lives within 1 hour of care ?- Has a competent adult at home to recover with post-op recover ?- NO history of ? - Chronic pain requiring opiods ? - Diabetes ? - Coronary Artery Disease ? - Heart failure ? - Heart attack ? - Stroke ? - DVT/VTE ? - Cardiac arrhythmia ? - Respiratory Failure/COPD ? - Renal failure ? - Anemia ? - Advanced Liver disease ? ?  ? ?KRISTIEN SALATINO is an 76 y.o. female.   ? ?Chief Complaint: left shoulder pain ? ?HPI: Pt is a 76 y.o. female complaining of left shoulder pain for multiple years. Pain had continually increased since the beginning. X-rays in the clinic show end-stage arthritic changes of the left shoulder. Pt has tried various conservative treatments which have failed to alleviate their symptoms, including injections and therapy. Various options are discussed with the patient. Risks, benefits and expectations were discussed with the patient. Patient understand the risks, benefits and expectations and wishes to proceed with surgery.  ? ?PCP:  Janie Morning, DO ? ?D/C Plans: Home ? ?PMH: ?Past Medical History:  ?Diagnosis Date  ? Alcoholic cirrhosis ([place])   ? Alcoholism ([place])   ? Arthritis   ? Family history of adverse reaction to anesthesia   ? Mother - PONV  ? GERD (gastroesophageal reflux disease)   ? Hyperlipidemia   ? Spastic esophagus   ? ? ?PSH: ?Past Surgical History:  ?Procedure Laterality Date  ? BUNIONECTOMY    ? both feet  ? CARPAL TUNNEL RELEASE  10/13/2011  ? Procedure: CARPAL TUNNEL RELEASE;  Surgeon: Wynonia Sours, MD;  Location: [facility];  Service: Orthopedics;  Laterality: Right;  ? COLONOSCOPY  2018  ? Dr. Collene Mares   ? EAR CYST EXCISION  10/13/2011  ? Procedure: CYST REMOVAL;  Surgeon: Wynonia Sours, MD;  Location: [facility];  Service: Orthopedics;  Laterality: Right;  excision cyst possible rotator flap, debridement dip right middle  finger  ? left knee arthroscopy    ? NASAL SEPTOPLASTY W/ TURBINOPLASTY Bilateral 05/17/2020  ? Procedure: NASAL SEPTOPLASTY WITH TURBINATE REDUCTION;  Surgeon: Beverly Gust, MD;  Location: [facility];  Service: ENT;  Laterality: Bilateral;  ? right rotator cuff repair    ? TOTAL LAPAROSCOPIC HYSTERECTOMY WITH BILATERAL SALPINGO OOPHORECTOMY    ? menorrhagia  ? ? ?Social History:  reports that she quit smoking about 31 years ago. Her smoking use included cigarettes. She has never used smokeless tobacco. She reports current alcohol use of about 2.0 standard drinks per week. She reports that she does not use drugs. ? ?Allergies:  ?No Known Allergies ? ?Medications: ?No current facility-administered medications for this encounter.  ? ?Current Outpatient Medications  ?Medication Sig Dispense Refill  ? lactulose (CHRONULAC) 10 GM/15ML solution Take 45 mLs (30 g total) by mouth 3 (three) times daily. (Patient taking differently: Take 30 g by mouth 3 (three) times daily. (currently taking daily)) 236 mL 0  ? mirabegron ER (MYRBETRIQ) 25 MG TB24 tablet Take 1 tablet (25 mg total) by mouth daily. 30 tablet 5  ? spironolactone (ALDACTONE) 50 MG tablet Take 1 tablet (50 mg total) by mouth daily. 30 tablet 0  ? ? ?No results found for this or any previous visit (from the past 48 hour(s)). ?No results found. ? ?ROS: ?Pain with rom of the left upper extremity ? ?Physical Exam: ?Alert and oriented  76 y.o. female in no acute distress ?Cranial nerves 2-12 intact ?Cervical spine: full rom with no tenderness, nv intact distally ?Chest: active breath sounds bilaterally, no wheeze rhonchi or rales ?Heart: regular rate and rhythm, no murmur ?Abd: non tender non distended with active bowel sounds ?Hip is stable with rom  ?Left shoulder painful and weak rom ?Nv intact distally ?No rashes or edema distally ? ?Assessment/Plan ?Assessment: left shoulder cuff arthropathy ? ?Plan: ? ?Patient will undergo a left reverse total  shoulder by Dr. Veverly Fells at [place] Risks benefits and expectations were discussed with the patient. Patient understand risks, benefits and expectations and wishes to proceed. ?Preoperative templating of the joint replacement has been completed, documented, and submitted to the Operating Room personnel in order to optimize intra-operative equipment management.  ? ?Merla Riches PA-C, MPAS ?[facility] [employer]  [facility] ?[address], [address], [place], [place] [address] ?Phone: [phone] ?[email or web address] ?Facebook  Engineer, structural  ? ?  ? ?

## 2022-04-01 DIAGNOSIS — M9905 Segmental and somatic dysfunction of pelvic region: Secondary | ICD-10-CM | POA: Diagnosis not present

## 2022-04-01 DIAGNOSIS — N39498 Other specified urinary incontinence: Secondary | ICD-10-CM | POA: Diagnosis not present

## 2022-04-01 DIAGNOSIS — R2689 Other abnormalities of gait and mobility: Secondary | ICD-10-CM | POA: Diagnosis not present

## 2022-04-01 DIAGNOSIS — M6281 Muscle weakness (generalized): Secondary | ICD-10-CM | POA: Diagnosis not present

## 2022-04-03 DIAGNOSIS — M6281 Muscle weakness (generalized): Secondary | ICD-10-CM | POA: Diagnosis not present

## 2022-04-03 DIAGNOSIS — M9905 Segmental and somatic dysfunction of pelvic region: Secondary | ICD-10-CM | POA: Diagnosis not present

## 2022-04-03 DIAGNOSIS — R2689 Other abnormalities of gait and mobility: Secondary | ICD-10-CM | POA: Diagnosis not present

## 2022-04-03 DIAGNOSIS — N39498 Other specified urinary incontinence: Secondary | ICD-10-CM | POA: Diagnosis not present

## 2022-04-03 NOTE — Telephone Encounter (Signed)
Attempted to call x2. Left VM with office number ?

## 2022-04-03 NOTE — Patient Instructions (Addendum)
DUE TO COVID-19 ONLY TWO VISITORS  (aged 76 and older)  ARE ALLOWED TO COME WITH YOU AND STAY IN THE WAITING ROOM ONLY DURING PRE OP AND PROCEDURE.   ?**NO VISITORS ARE ALLOWED IN THE SHORT STAY AREA OR RECOVERY ROOM!!** ? ?IF YOU WILL BE ADMITTED INTO THE HOSPITAL YOU ARE ALLOWED ONLY FOUR SUPPORT PEOPLE DURING VISITATION HOURS ONLY (7 AM -8PM)   ?The support person(s) must pass our screening, gel in and out, and wear a mask at all times, including in the patient?s room. ?Patients must also wear a mask when staff or their support person are in the room. ?Visitors GUEST BADGE MUST BE WORN VISIBLY  ?One adult visitor may remain with you overnight and MUST be in the room by 8 P.M. ?  ? ? Your procedure is scheduled on: 04/24/22 ? ? Report to [facility] Main Entrance ? ?  Report to admitting at 5:15 AM ? ? Call this number if you have problems the morning of surgery [phone] ? ? Do not eat food :After Midnight. ? ? After Midnight you may have the following liquids until 4:30 AM DAY OF SURGERY ? ?Water ?Black Coffee (sugar ok, NO MILK/CREAM OR CREAMERS)  ?Tea (sugar ok, NO MILK/CREAM OR CREAMERS) regular and decaf                             ?Plain Jell-O (NO RED)                                           ?Fruit ices (not with fruit pulp, NO RED)                                     ?Popsicles (NO RED)                                                                  ?Juice: apple, WHITE grape, WHITE cranberry ?Sports drinks like Gatorade (NO RED) ?Clear broth(vegetable,chicken,beef) ? ?              ?The day of surgery:  ?Drink ONE (1) Pre-Surgery Clear Ensure at 4:30 AM the morning of surgery. Drink in one sitting. Do not sip.  ?This drink was given to you during your hospital  ?pre-op appointment visit. ?Nothing else to drink after completing the  ?Pre-Surgery Clear Ensure. ?  ?       If you have questions, please contact your surgeon?s office. ? ? ?FOLLOW BOWEL PREP AND ANY ADDITIONAL PRE OP INSTRUCTIONS  YOU RECEIVED FROM YOUR SURGEON'S OFFICE!!! ?  ?  ?Oral Hygiene is also important to reduce your risk of infection.                                    ?Remember - BRUSH YOUR TEETH THE MORNING OF SURGERY WITH YOUR REGULAR TOOTHPASTE ? ? Take these medicines the morning of surgery with A SIP OF WATER: None  ?                  ?  You may not have any metal on your body including hair pins, jewelry, and body piercing ? ?           Do not wear make-up, lotions, powders, perfumes, or deodorant ? ?Do not wear nail polish including gel and S&S, artificial/acrylic nails, or any other type of covering on natural nails including finger and toenails. If you have artificial nails, gel coating, etc. that needs to be removed by a nail salon please have this removed prior to surgery or surgery may need to be canceled/ delayed if the surgeon/ anesthesia feels like they are unable to be safely monitored.  ? ?Do not shave  48 hours prior to surgery.  ? ? Do not bring valuables to the hospital. [facility] NOT ?            RESPONSIBLE   FOR VALUABLES. ? ? Bring small overnight bag day of surgery. ?  ? Special Instructions: Bring a copy of your healthcare power of attorney and living will documents         the day of surgery if you haven't scanned them before. ? ?            Please read over the following fact sheets you were given: IF [place] [phone]- Apolonio Schneiders  ? ?   [facility] - Preparing for Surgery ?Before surgery, you can play an important role.  Because skin is not sterile, your skin needs to be as free of germs as possible.  You can reduce the number of germs on your skin by washing with CHG (chlorahexidine gluconate) soap before surgery.  CHG is an antiseptic cleaner which kills germs and bonds with the skin to continue killing germs even after washing. ?Please DO NOT use if you have an allergy to CHG or antibacterial soaps.  If your skin becomes  reddened/irritated stop using the CHG and inform your nurse when you arrive at Short Stay. ?Do not shave (including legs and underarms) for at least 48 hours prior to the first CHG shower.  You may shave your face/neck. ? ?Please follow these instructions carefully: ? 1.  Shower with CHG Soap the night before surgery and the  morning of surgery. ? 2.  If you choose to wash your hair, wash your hair first as usual with your normal  shampoo. ? 3.  After you shampoo, rinse your hair and body thoroughly to remove the shampoo.                            ? 4.  Use CHG as you would any other liquid soap.  You can apply chg directly to the skin and wash.  Gently with a scrungie or clean washcloth. ? 5.  Apply the CHG Soap to your body ONLY FROM THE NECK DOWN.   Do   not use on face/ open      ?                     Wound or open sores. Avoid contact with eyes, ears mouth and   genitals (private parts).  ?                     Production manager,  Genitals (private parts) with your normal soap. ?            6.  Wash thoroughly, paying special  attention to the area where your    surgery  will be performed. ? 7.  Thoroughly rinse your body with warm water from the neck down. ? 8.  DO NOT shower/wash with your normal soap after using and rinsing off the CHG Soap. ?               9.  Pat yourself dry with a clean towel. ?           10.  Wear clean pajamas. ?           11.  Place clean sheets on your bed the night of your first shower and do not  sleep with pets. ?Day of Surgery : ?Do not apply any lotions/deodorants the morning of surgery.  Please wear clean clothes to the hospital/surgery center. ? ?FAILURE TO FOLLOW THESE INSTRUCTIONS MAY RESULT IN THE CANCELLATION OF YOUR SURGERY ? ?PATIENT SIGNATURE_________________________________ ? ?NURSE SIGNATURE__________________________________ ? ?________________________________________________________________________  ? ?Incentive Spirometer ? ?An incentive spirometer is a tool that can help  keep your lungs clear and active. This tool measures how well you are filling your lungs with each breath. Taking long deep breaths may help reverse or decrease the chance of developing breathing (pulmonary) problems (especially infection) following: ?A long period of time when you are unable to move or be active. ?BEFORE THE PROCEDURE  ?If the spirometer includes an indicator to show your best effort, your nurse or respiratory therapist will set it to a desired goal. ?If possible, sit up straight or lean slightly forward. Try not to slouch. ?Hold the incentive spirometer in an upright position. ?INSTRUCTIONS FOR USE  ?Sit on the edge of your bed if possible, or sit up as far as you can in bed or on a chair. ?Hold the incentive spirometer in an upright position. ?Breathe out normally. ?Place the mouthpiece in your mouth and seal your lips tightly around it. ?Breathe in slowly and as deeply as possible, raising the piston or the ball toward the top of the column. ?Hold your breath for 3-5 seconds or for as long as possible. Allow the piston or ball to fall to the bottom of the column. ?Remove the mouthpiece from your mouth and breathe out normally. ?Rest for a few seconds and repeat Steps 1 through 7 at least 10 times every 1-2 hours when you are awake. Take your time and take a few normal breaths between deep breaths. ?The spirometer may include an indicator to show your best effort. Use the indicator as a goal to work toward during each repetition. ?After each set of 10 deep breaths, practice coughing to be sure your lungs are clear. If you have an incision (the cut made at the time of surgery), support your incision when coughing by placing a pillow or rolled up towels firmly against it. ?Once you are able to get out of bed, walk around indoors and cough well. You may stop using the incentive spirometer when instructed by your caregiver.  ?RISKS AND COMPLICATIONS ?Take your time so you do not get dizzy or  light-headed. ?If you are in pain, you may need to take or ask for pain medication before doing incentive spirometry. It is harder to take a deep breath if you are having pain. ?AFTER USE ?Rest and breathe slowly and easily.

## 2022-04-03 NOTE — Progress Notes (Addendum)
COVID Vaccine Completed: yes x2 ?Date COVID Vaccine completed: 12/30/19, 01/20/20 ?Has received booster: ?COVID vaccine manufacturer: Pfizer     ? ?Date of COVID positive in last 90 days: no ? ?PCP - Janie Morning, DO ?Cardiologist - n/a ? ?Chest x-ray - n/a ?EKG - 04/06/22 Epic/chart ?Stress Test - years ago per pt ?ECHO - 12/18/18 Epic ?Cardiac Cath - n/a ?Pacemaker/ICD device last checked: n/a ?Spinal Cord Stimulator: n/a ? ?Bowel Prep - no ? ?Sleep Study - n/a ?CPAP -  ? ?Fasting Blood Sugar - n/a ?Checks Blood Sugar _____ times a day ? ?Blood Thinner Instructions: n/a ?Aspirin Instructions: ?Last Dose: ? ?Activity level: Can go up a flight of stairs and perform activities of daily living without stopping and without symptoms of chest pain or shortness of breath. ?   ? ?Anesthesia review:  ? ?Patient denies shortness of breath, fever, cough and chest pain at PAT appointment ? ? ?Patient verbalized understanding of instructions that were given to them at the PAT appointment. Patient was also instructed that they will need to review over the PAT instructions again at home before surgery.  ?

## 2022-04-06 ENCOUNTER — Encounter (HOSPITAL_COMMUNITY)
Admission: RE | Admit: 2022-04-06 | Discharge: 2022-04-06 | Disposition: A | Discharge status: Home / Self Care | Payer: Medicare Other | Source: Ambulatory Visit | Attending: Orthopedic Surgery | Admitting: Orthopedic Surgery

## 2022-04-06 ENCOUNTER — Encounter (HOSPITAL_COMMUNITY): Payer: Self-pay

## 2022-04-06 VITALS — BP 140/69 | HR 75 | Temp 98.1°F | Resp 16 | Ht 64.0 in | Wt 174.8 lb

## 2022-04-06 DIAGNOSIS — Z01818 Encounter for other preprocedural examination: Secondary | ICD-10-CM | POA: Diagnosis not present

## 2022-04-06 DIAGNOSIS — I10 Essential (primary) hypertension: Secondary | ICD-10-CM

## 2022-04-06 DIAGNOSIS — K7031 Alcoholic cirrhosis of liver with ascites: Secondary | ICD-10-CM

## 2022-04-06 HISTORY — DX: Pneumonia, unspecified organism: J18.9

## 2022-04-06 HISTORY — DX: Essential (primary) hypertension: I10

## 2022-04-06 LAB — SURGICAL PCR SCREEN
MRSA, PCR: NEGATIVE
Staphylococcus aureus: NEGATIVE

## 2022-04-07 DIAGNOSIS — M6281 Muscle weakness (generalized): Secondary | ICD-10-CM | POA: Diagnosis not present

## 2022-04-07 DIAGNOSIS — Z9181 History of falling: Secondary | ICD-10-CM | POA: Diagnosis not present

## 2022-04-07 DIAGNOSIS — M9905 Segmental and somatic dysfunction of pelvic region: Secondary | ICD-10-CM | POA: Diagnosis not present

## 2022-04-07 DIAGNOSIS — N39498 Other specified urinary incontinence: Secondary | ICD-10-CM | POA: Diagnosis not present

## 2022-04-07 DIAGNOSIS — R2681 Unsteadiness on feet: Secondary | ICD-10-CM | POA: Diagnosis not present

## 2022-04-07 DIAGNOSIS — R2689 Other abnormalities of gait and mobility: Secondary | ICD-10-CM | POA: Diagnosis not present

## 2022-04-07 DIAGNOSIS — Z471 Aftercare following joint replacement surgery: Secondary | ICD-10-CM | POA: Diagnosis not present

## 2022-04-07 NOTE — H&P (Signed)
?Patient's anticipated LOS is less than 2 midnights, meeting these requirements: ?- Younger than 47 ?- Lives within 1 hour of care ?- Has a competent adult at home to recover with post-op recover ?- NO history of ? - Chronic pain requiring opiods ? - Diabetes ? - Coronary Artery Disease ? - Heart failure ? - Heart attack ? - Stroke ? - DVT/VTE ? - Cardiac arrhythmia ? - Respiratory Failure/COPD ? - Renal failure ? - Anemia ? - Advanced Liver disease ? ?  ? ?Darlene Delacruz is an 76 y.o. female.   ? ?Chief Complaint: left shoulder pain ? ?HPI: Pt is a 76 y.o. female complaining of left shoulder pain for multiple years. Pain had continually increased since the beginning. X-rays in the clinic show end-stage arthritic changes of the left shoulder. Pt has tried various conservative treatments which have failed to alleviate their symptoms, including injections and therapy. Various options are discussed with the patient. Risks, benefits and expectations were discussed with the patient. Patient understand the risks, benefits and expectations and wishes to proceed with surgery.  ? ?PCP:  Janie Morning, DO ? ?D/C Plans: Home ? ?PMH: ?Past Medical History:  ?Diagnosis Date  ? Alcoholic cirrhosis ([place])   ? Alcoholism ([place])   ? Arthritis   ? Family history of adverse reaction to anesthesia   ? Mother - PONV  ? GERD (gastroesophageal reflux disease)   ? Hyperlipidemia   ? Hypertension   ? Pneumonia   ? Spastic esophagus   ? ? ?PSH: ?Past Surgical History:  ?Procedure Laterality Date  ? BUNIONECTOMY    ? both feet  ? CARPAL TUNNEL RELEASE  10/13/2011  ? Procedure: CARPAL TUNNEL RELEASE;  Surgeon: Wynonia Sours, MD;  Location: [facility];  Service: Orthopedics;  Laterality: Right;  ? COLONOSCOPY  2018  ? Dr. Collene Mares   ? EAR CYST EXCISION  10/13/2011  ? Procedure: CYST REMOVAL;  Surgeon: Wynonia Sours, MD;  Location: [facility];  Service: Orthopedics;  Laterality: Right;  excision cyst possible rotator flap,  debridement dip right middle finger  ? left knee arthroscopy    ? NASAL SEPTOPLASTY W/ TURBINOPLASTY Bilateral 05/17/2020  ? Procedure: NASAL SEPTOPLASTY WITH TURBINATE REDUCTION;  Surgeon: Beverly Gust, MD;  Location: [facility];  Service: ENT;  Laterality: Bilateral;  ? right rotator cuff repair    ? TOTAL LAPAROSCOPIC HYSTERECTOMY WITH BILATERAL SALPINGO OOPHORECTOMY    ? menorrhagia  ? ? ?Social History:  reports that she quit smoking about 31 years ago. Her smoking use included cigarettes. She has never used smokeless tobacco. She reports current alcohol use of about 2.0 standard drinks per week. She reports that she does not use drugs. ?BMI: ?Estimated body mass index is 30 kg/m? as calculated from the following: ?  Height as of 04/06/22: '5\' 4"'$  (1.626 m). ?  Weight as of 04/06/22: 79.3 kg. ? ?Lab Results  ?Component Value Date  ? ALBUMIN 2.6 (L) 01/02/2019  ? ?Diabetes: ?Patient does not have a diagnosis of diabetes. ?  ?  ?Smoking Status: ?Social History  ? ?Tobacco Use  ?Smoking Status Former  ? Types: Cigarettes  ? Quit date: 04/07/1991  ? Years since quitting: 31.0  ?Smokeless Tobacco Never  ? ?The patient is not currently a tobacco user. ?Counseling given: Not Answered   ?  ?Allergies:  ?Allergies  ?Allergen Reactions  ? Atorvastatin Calcium   ?  muscle cramps  ? ? ?Medications: ?No current  facility-administered medications for this encounter.  ? ?Current Outpatient Medications  ?Medication Sig Dispense Refill  ? Ascorbic Acid (VITAMIN C PO) Take 2,000 mg by mouth daily.    ? ibuprofen (ADVIL) 200 MG tablet Take 200 mg by mouth every 6 (six) hours as needed for moderate pain.    ? lactulose (CHRONULAC) 10 GM/15ML solution Take 45 mLs (30 g total) by mouth 3 (three) times daily. (Patient taking differently: Take 30 g by mouth 2 (two) times daily.) 236 mL 0  ? sodium chloride (OCEAN) 0.65 % SOLN nasal spray Place 1 spray into both nostrils as needed for congestion.    ? spironolactone (ALDACTONE) 50  MG tablet Take 1 tablet (50 mg total) by mouth daily. 30 tablet 0  ? mirabegron ER (MYRBETRIQ) 25 MG TB24 tablet Take 1 tablet (25 mg total) by mouth daily. (Patient not taking: Reported on 04/02/2022) 30 tablet 5  ? ? ?Results for orders placed or performed during the hospital encounter of 04/06/22 (from the past 48 hour(s))  ?Surgical pcr screen     Status: None  ? Collection Time: 04/06/22  9:52 AM  ? Specimen: Nasal Mucosa; Nasal Swab  ?Result Value Ref Range  ? MRSA, PCR NEGATIVE NEGATIVE  ? Staphylococcus aureus NEGATIVE NEGATIVE  ?  Comment: (NOTE) ?The Xpert SA Assay (FDA approved for NASAL specimens in patients 24 ?years of age and older), is one component of a comprehensive ?surveillance program. It is not intended to diagnose infection nor to ?guide or monitor treatment. ?Performed at [facility], [address] ?[place], [place] [address] ?  ? ?No results found. ? ?ROS: ?Pain with rom of the left  extremity ? ?Physical Exam: ?Alert and oriented 76 y.o. female in no acute distress ?Cranial nerves 2-12 intact ?Cervical spine: full rom with no tenderness, nv intact distally ?Chest: active breath sounds bilaterally, no wheeze rhonchi or rales ?Heart: regular rate and rhythm, no murmur ?Abd: non tender non distended with active bowel sounds ?Hip is stable with rom  ?Left shoulder painful and weak rom ?Nv intact distally ?No rashes or edema distally ? ?Assessment/Plan ?Assessment: left shoulder cuff arthropathy ? ?Plan: ? ?Patient will undergo a left reverse shoulder by Dr. Veverly Fells at [facility] benefits and expectations were discussed with the patient. Patient understand risks, benefits and expectations and wishes to proceed. ?Preoperative templating of the joint replacement has been completed, documented, and submitted to the Operating Room personnel in order to optimize intra-operative equipment management.  ? ?Merla Riches PA-C, MPAS ?[facility] ?[address], [place], [place] [address] ?Phone: [phone] ?[email or web address] ?Facebook  Engineer, structural  ? ?  ? ?

## 2022-04-09 DIAGNOSIS — M6281 Muscle weakness (generalized): Secondary | ICD-10-CM | POA: Diagnosis not present

## 2022-04-09 DIAGNOSIS — Z9181 History of falling: Secondary | ICD-10-CM | POA: Diagnosis not present

## 2022-04-09 DIAGNOSIS — R2689 Other abnormalities of gait and mobility: Secondary | ICD-10-CM | POA: Diagnosis not present

## 2022-04-09 DIAGNOSIS — M9905 Segmental and somatic dysfunction of pelvic region: Secondary | ICD-10-CM | POA: Diagnosis not present

## 2022-04-09 DIAGNOSIS — R2681 Unsteadiness on feet: Secondary | ICD-10-CM | POA: Diagnosis not present

## 2022-04-09 DIAGNOSIS — Z471 Aftercare following joint replacement surgery: Secondary | ICD-10-CM | POA: Diagnosis not present

## 2022-04-14 DIAGNOSIS — Z9181 History of falling: Secondary | ICD-10-CM | POA: Diagnosis not present

## 2022-04-14 DIAGNOSIS — R2689 Other abnormalities of gait and mobility: Secondary | ICD-10-CM | POA: Diagnosis not present

## 2022-04-14 DIAGNOSIS — M6281 Muscle weakness (generalized): Secondary | ICD-10-CM | POA: Diagnosis not present

## 2022-04-14 DIAGNOSIS — R2681 Unsteadiness on feet: Secondary | ICD-10-CM | POA: Diagnosis not present

## 2022-04-14 DIAGNOSIS — M9905 Segmental and somatic dysfunction of pelvic region: Secondary | ICD-10-CM | POA: Diagnosis not present

## 2022-04-14 DIAGNOSIS — Z471 Aftercare following joint replacement surgery: Secondary | ICD-10-CM | POA: Diagnosis not present

## 2022-04-16 DIAGNOSIS — Z9181 History of falling: Secondary | ICD-10-CM | POA: Diagnosis not present

## 2022-04-16 DIAGNOSIS — Z471 Aftercare following joint replacement surgery: Secondary | ICD-10-CM | POA: Diagnosis not present

## 2022-04-16 DIAGNOSIS — M6281 Muscle weakness (generalized): Secondary | ICD-10-CM | POA: Diagnosis not present

## 2022-04-16 DIAGNOSIS — M9905 Segmental and somatic dysfunction of pelvic region: Secondary | ICD-10-CM | POA: Diagnosis not present

## 2022-04-16 DIAGNOSIS — R2681 Unsteadiness on feet: Secondary | ICD-10-CM | POA: Diagnosis not present

## 2022-04-16 DIAGNOSIS — R2689 Other abnormalities of gait and mobility: Secondary | ICD-10-CM | POA: Diagnosis not present

## 2022-04-20 DIAGNOSIS — L570 Actinic keratosis: Secondary | ICD-10-CM | POA: Diagnosis not present

## 2022-04-20 DIAGNOSIS — D1801 Hemangioma of skin and subcutaneous tissue: Secondary | ICD-10-CM | POA: Diagnosis not present

## 2022-04-20 DIAGNOSIS — L814 Other melanin hyperpigmentation: Secondary | ICD-10-CM | POA: Diagnosis not present

## 2022-04-20 DIAGNOSIS — L821 Other seborrheic keratosis: Secondary | ICD-10-CM | POA: Diagnosis not present

## 2022-04-21 DIAGNOSIS — Z9181 History of falling: Secondary | ICD-10-CM | POA: Diagnosis not present

## 2022-04-21 DIAGNOSIS — M9905 Segmental and somatic dysfunction of pelvic region: Secondary | ICD-10-CM | POA: Diagnosis not present

## 2022-04-21 DIAGNOSIS — Z471 Aftercare following joint replacement surgery: Secondary | ICD-10-CM | POA: Diagnosis not present

## 2022-04-21 DIAGNOSIS — R2681 Unsteadiness on feet: Secondary | ICD-10-CM | POA: Diagnosis not present

## 2022-04-21 DIAGNOSIS — R2689 Other abnormalities of gait and mobility: Secondary | ICD-10-CM | POA: Diagnosis not present

## 2022-04-21 DIAGNOSIS — M6281 Muscle weakness (generalized): Secondary | ICD-10-CM | POA: Diagnosis not present

## 2022-04-23 DIAGNOSIS — M6281 Muscle weakness (generalized): Secondary | ICD-10-CM | POA: Diagnosis not present

## 2022-04-23 DIAGNOSIS — Z9181 History of falling: Secondary | ICD-10-CM | POA: Diagnosis not present

## 2022-04-23 DIAGNOSIS — M9905 Segmental and somatic dysfunction of pelvic region: Secondary | ICD-10-CM | POA: Diagnosis not present

## 2022-04-23 DIAGNOSIS — R2689 Other abnormalities of gait and mobility: Secondary | ICD-10-CM | POA: Diagnosis not present

## 2022-04-23 DIAGNOSIS — R2681 Unsteadiness on feet: Secondary | ICD-10-CM | POA: Diagnosis not present

## 2022-04-23 DIAGNOSIS — Z471 Aftercare following joint replacement surgery: Secondary | ICD-10-CM | POA: Diagnosis not present

## 2022-04-24 ENCOUNTER — Ambulatory Visit (HOSPITAL_BASED_OUTPATIENT_CLINIC_OR_DEPARTMENT_OTHER): Payer: Medicare Other | Admitting: Certified Registered"

## 2022-04-24 ENCOUNTER — Observation Stay (HOSPITAL_COMMUNITY): Payer: Medicare Other

## 2022-04-24 ENCOUNTER — Encounter (HOSPITAL_COMMUNITY): Payer: Self-pay | Admitting: Orthopedic Surgery

## 2022-04-24 ENCOUNTER — Observation Stay (HOSPITAL_COMMUNITY)
Admission: RE | Admit: 2022-04-24 | Discharge: 2022-04-25 | Disposition: A | Discharge status: Home / Self Care | Payer: Medicare Other | Attending: Orthopedic Surgery | Admitting: Orthopedic Surgery

## 2022-04-24 ENCOUNTER — Other Ambulatory Visit: Payer: Self-pay

## 2022-04-24 ENCOUNTER — Ambulatory Visit (HOSPITAL_COMMUNITY): Payer: Medicare Other | Admitting: Physician Assistant

## 2022-04-24 ENCOUNTER — Encounter (HOSPITAL_COMMUNITY)
Admission: RE | Disposition: A | Discharge status: Home / Self Care | Payer: Self-pay | Source: Home / Self Care | Attending: Orthopedic Surgery

## 2022-04-24 DIAGNOSIS — S4292XP Fracture of left shoulder girdle, part unspecified, subsequent encounter for fracture with malunion: Secondary | ICD-10-CM

## 2022-04-24 DIAGNOSIS — R21 Rash and other nonspecific skin eruption: Secondary | ICD-10-CM | POA: Insufficient documentation

## 2022-04-24 DIAGNOSIS — K7030 Alcoholic cirrhosis of liver without ascites: Secondary | ICD-10-CM | POA: Diagnosis not present

## 2022-04-24 DIAGNOSIS — I10 Essential (primary) hypertension: Secondary | ICD-10-CM | POA: Diagnosis not present

## 2022-04-24 DIAGNOSIS — Z87891 Personal history of nicotine dependence: Secondary | ICD-10-CM | POA: Diagnosis not present

## 2022-04-24 DIAGNOSIS — X58XXXA Exposure to other specified factors, initial encounter: Secondary | ICD-10-CM | POA: Insufficient documentation

## 2022-04-24 DIAGNOSIS — M19012 Primary osteoarthritis, left shoulder: Secondary | ICD-10-CM

## 2022-04-24 DIAGNOSIS — Z96612 Presence of left artificial shoulder joint: Secondary | ICD-10-CM | POA: Diagnosis not present

## 2022-04-24 DIAGNOSIS — E669 Obesity, unspecified: Secondary | ICD-10-CM | POA: Diagnosis not present

## 2022-04-24 DIAGNOSIS — M25512 Pain in left shoulder: Secondary | ICD-10-CM | POA: Diagnosis present

## 2022-04-24 DIAGNOSIS — Z79899 Other long term (current) drug therapy: Secondary | ICD-10-CM | POA: Insufficient documentation

## 2022-04-24 DIAGNOSIS — W19XXXD Unspecified fall, subsequent encounter: Secondary | ICD-10-CM | POA: Insufficient documentation

## 2022-04-24 DIAGNOSIS — R011 Cardiac murmur, unspecified: Secondary | ICD-10-CM | POA: Insufficient documentation

## 2022-04-24 DIAGNOSIS — I350 Nonrheumatic aortic (valve) stenosis: Secondary | ICD-10-CM | POA: Diagnosis not present

## 2022-04-24 DIAGNOSIS — F419 Anxiety disorder, unspecified: Secondary | ICD-10-CM | POA: Insufficient documentation

## 2022-04-24 DIAGNOSIS — G8918 Other acute postprocedural pain: Secondary | ICD-10-CM | POA: Diagnosis not present

## 2022-04-24 DIAGNOSIS — S42202K Unspecified fracture of upper end of left humerus, subsequent encounter for fracture with nonunion: Secondary | ICD-10-CM | POA: Diagnosis not present

## 2022-04-24 DIAGNOSIS — M75122 Complete rotator cuff tear or rupture of left shoulder, not specified as traumatic: Secondary | ICD-10-CM | POA: Diagnosis not present

## 2022-04-24 DIAGNOSIS — M75102 Unspecified rotator cuff tear or rupture of left shoulder, not specified as traumatic: Secondary | ICD-10-CM | POA: Insufficient documentation

## 2022-04-24 DIAGNOSIS — Z9181 History of falling: Secondary | ICD-10-CM | POA: Insufficient documentation

## 2022-04-24 DIAGNOSIS — Z9889 Other specified postprocedural states: Secondary | ICD-10-CM | POA: Diagnosis not present

## 2022-04-24 DIAGNOSIS — Z471 Aftercare following joint replacement surgery: Secondary | ICD-10-CM | POA: Diagnosis not present

## 2022-04-24 DIAGNOSIS — M6281 Muscle weakness (generalized): Secondary | ICD-10-CM | POA: Diagnosis not present

## 2022-04-24 DIAGNOSIS — Z6830 Body mass index (BMI) 30.0-30.9, adult: Secondary | ICD-10-CM | POA: Diagnosis not present

## 2022-04-24 DIAGNOSIS — K219 Gastro-esophageal reflux disease without esophagitis: Secondary | ICD-10-CM | POA: Insufficient documentation

## 2022-04-24 HISTORY — PX: REVERSE SHOULDER ARTHROPLASTY: SHX5054

## 2022-04-24 SURGERY — ARTHROPLASTY, SHOULDER, TOTAL, REVERSE
Anesthesia: General | Site: Shoulder | Laterality: Left

## 2022-04-24 MED ORDER — LACTULOSE 10 GM/15ML PO SOLN
30.0000 g | Freq: Two times a day (BID) | ORAL | Status: DC
  Administered 2022-04-24 – 2022-04-25 (×2): 30 g via ORAL
  Filled 2022-04-24 (×2): qty 45

## 2022-04-24 MED ORDER — DOCUSATE SODIUM 100 MG PO CAPS
100.0000 mg | ORAL_CAPSULE | Freq: Two times a day (BID) | ORAL | Status: DC
  Administered 2022-04-24 – 2022-04-25 (×3): 100 mg via ORAL
  Filled 2022-04-24 (×3): qty 1

## 2022-04-24 MED ORDER — ONDANSETRON HCL 4 MG/2ML IJ SOLN
INTRAMUSCULAR | Status: DC | PRN
  Administered 2022-04-24: 4 mg via INTRAVENOUS

## 2022-04-24 MED ORDER — ORAL CARE MOUTH RINSE: 15.0000 mL | Freq: Once | OROMUCOSAL | Status: AC

## 2022-04-24 MED ORDER — METOCLOPRAMIDE HCL 5 MG PO TABS: 5.0000 mg | ORAL_TABLET | Freq: Three times a day (TID) | ORAL | Status: DC | PRN

## 2022-04-24 MED ORDER — PROPOFOL 10 MG/ML IV BOLUS
INTRAVENOUS | Status: AC
  Filled 2022-04-24: qty 20

## 2022-04-24 MED ORDER — PHENOL 1.4 % MT LIQD: 1.0000 | OROMUCOSAL | Status: DC | PRN

## 2022-04-24 MED ORDER — LIDOCAINE HCL (CARDIAC) PF 100 MG/5ML IV SOSY
PREFILLED_SYRINGE | INTRAVENOUS | Status: DC | PRN
  Administered 2022-04-24: 50 mg via INTRAVENOUS

## 2022-04-24 MED ORDER — PHENYLEPHRINE HCL (PRESSORS) 10 MG/ML IV SOLN
INTRAVENOUS | Status: DC | PRN
  Administered 2022-04-24 (×7): 80 ug via INTRAVENOUS
  Administered 2022-04-24: 120 ug via INTRAVENOUS

## 2022-04-24 MED ORDER — ONDANSETRON HCL 4 MG PO TABS: 4.0000 mg | ORAL_TABLET | Freq: Four times a day (QID) | ORAL | Status: DC | PRN

## 2022-04-24 MED ORDER — OXYCODONE HCL 5 MG/5ML PO SOLN: 5.0000 mg | Freq: Once | ORAL | Status: DC | PRN

## 2022-04-24 MED ORDER — SUCCINYLCHOLINE CHLORIDE 200 MG/10ML IV SOSY
PREFILLED_SYRINGE | INTRAVENOUS | Status: DC | PRN
Start: 2022-04-24 — End: 2022-04-24
  Administered 2022-04-24: 120 mg via INTRAVENOUS

## 2022-04-24 MED ORDER — TRANEXAMIC ACID-NACL 1000-0.7 MG/100ML-% IV SOLN
1000.0000 mg | Freq: Once | INTRAVENOUS | Status: AC
  Administered 2022-04-24: 1000 mg via INTRAVENOUS
  Filled 2022-04-24: qty 100

## 2022-04-24 MED ORDER — BUPIVACAINE LIPOSOME 1.3 % IJ SUSP
INTRAMUSCULAR | Status: DC | PRN
  Administered 2022-04-24: 10 mL via PERINEURAL

## 2022-04-24 MED ORDER — PROPOFOL 10 MG/ML IV BOLUS
INTRAVENOUS | Status: DC | PRN
Start: 2022-04-24 — End: 2022-04-24
  Administered 2022-04-24: 150 mg via INTRAVENOUS

## 2022-04-24 MED ORDER — METOCLOPRAMIDE HCL 5 MG/ML IJ SOLN: 5.0000 mg | Freq: Three times a day (TID) | INTRAMUSCULAR | Status: DC | PRN

## 2022-04-24 MED ORDER — AMISULPRIDE (ANTIEMETIC) 5 MG/2ML IV SOLN: 10.0000 mg | Freq: Once | INTRAVENOUS | Status: DC | PRN

## 2022-04-24 MED ORDER — SODIUM CHLORIDE 0.9 % IR SOLN
Status: DC | PRN
  Administered 2022-04-24: 1000 mL

## 2022-04-24 MED ORDER — FENTANYL CITRATE (PF) 100 MCG/2ML IJ SOLN
INTRAMUSCULAR | Status: DC | PRN
  Administered 2022-04-24 (×2): 50 ug via INTRAVENOUS
  Administered 2022-04-24: 100 ug via INTRAVENOUS

## 2022-04-24 MED ORDER — BISACODYL 10 MG RE SUPP
10.0000 mg | Freq: Every day | RECTAL | Status: DC | PRN
Start: 2022-04-24 — End: 2022-04-25

## 2022-04-24 MED ORDER — ONDANSETRON HCL 4 MG/2ML IJ SOLN
INTRAMUSCULAR | Status: AC
  Filled 2022-04-24: qty 2

## 2022-04-24 MED ORDER — MIDAZOLAM HCL 5 MG/5ML IJ SOLN
INTRAMUSCULAR | Status: DC | PRN
  Administered 2022-04-24: 2 mg via INTRAVENOUS

## 2022-04-24 MED ORDER — DEXAMETHASONE SODIUM PHOSPHATE 10 MG/ML IJ SOLN
INTRAMUSCULAR | Status: AC
  Filled 2022-04-24: qty 1

## 2022-04-24 MED ORDER — EPHEDRINE SULFATE (PRESSORS) 50 MG/ML IJ SOLN
INTRAMUSCULAR | Status: DC | PRN
Start: 2022-04-24 — End: 2022-04-24
  Administered 2022-04-24 (×2): 10 mg via INTRAVENOUS

## 2022-04-24 MED ORDER — EPHEDRINE 5 MG/ML INJ
INTRAVENOUS | Status: AC
  Filled 2022-04-24: qty 5

## 2022-04-24 MED ORDER — LACTATED RINGERS IV SOLN: INTRAVENOUS | Status: DC

## 2022-04-24 MED ORDER — MIDAZOLAM HCL 2 MG/2ML IJ SOLN
INTRAMUSCULAR | Status: AC
  Filled 2022-04-24: qty 2

## 2022-04-24 MED ORDER — PROMETHAZINE HCL 25 MG/ML IJ SOLN: 6.2500 mg | INTRAMUSCULAR | Status: DC | PRN

## 2022-04-24 MED ORDER — ONDANSETRON HCL 4 MG/2ML IJ SOLN: 4.0000 mg | Freq: Four times a day (QID) | INTRAMUSCULAR | Status: DC | PRN

## 2022-04-24 MED ORDER — ACETAMINOPHEN 325 MG PO TABS
325.0000 mg | ORAL_TABLET | Freq: Four times a day (QID) | ORAL | Status: DC | PRN
  Administered 2022-04-24: 650 mg via ORAL
  Filled 2022-04-24: qty 2

## 2022-04-24 MED ORDER — CHLORHEXIDINE GLUCONATE 0.12 % MT SOLN
15.0000 mL | Freq: Once | OROMUCOSAL | Status: AC
  Administered 2022-04-24: 15 mL via OROMUCOSAL

## 2022-04-24 MED ORDER — BUPIVACAINE-EPINEPHRINE (PF) 0.25% -1:200000 IJ SOLN
INTRAMUSCULAR | Status: DC | PRN
  Administered 2022-04-24: 20 mL

## 2022-04-24 MED ORDER — FENTANYL CITRATE (PF) 100 MCG/2ML IJ SOLN
INTRAMUSCULAR | Status: AC
  Filled 2022-04-24: qty 2

## 2022-04-24 MED ORDER — IBUPROFEN 200 MG PO TABS: 200.0000 mg | ORAL_TABLET | Freq: Four times a day (QID) | ORAL | Status: DC | PRN

## 2022-04-24 MED ORDER — POLYETHYLENE GLYCOL 3350 17 G PO PACK: 17.0000 g | PACK | Freq: Every day | ORAL | Status: DC | PRN

## 2022-04-24 MED ORDER — CEFAZOLIN SODIUM-DEXTROSE 2-4 GM/100ML-% IV SOLN
2.0000 g | Freq: Four times a day (QID) | INTRAVENOUS | Status: AC
  Administered 2022-04-24 – 2022-04-25 (×3): 2 g via INTRAVENOUS
  Filled 2022-04-24 (×3): qty 100

## 2022-04-24 MED ORDER — LIDOCAINE HCL (PF) 2 % IJ SOLN
INTRAMUSCULAR | Status: AC
  Filled 2022-04-24: qty 5

## 2022-04-24 MED ORDER — OXYCODONE HCL 5 MG PO TABS: 5.0000 mg | ORAL_TABLET | Freq: Once | ORAL | Status: DC | PRN

## 2022-04-24 MED ORDER — BUPIVACAINE HCL (PF) 0.5 % IJ SOLN
INTRAMUSCULAR | Status: DC | PRN
  Administered 2022-04-24: 20 mL via PERINEURAL

## 2022-04-24 MED ORDER — CEFAZOLIN SODIUM-DEXTROSE 2-4 GM/100ML-% IV SOLN
2.0000 g | INTRAVENOUS | Status: AC
  Administered 2022-04-24: 2 g via INTRAVENOUS
  Filled 2022-04-24: qty 100

## 2022-04-24 MED ORDER — SALINE SPRAY 0.65 % NA SOLN: 1.0000 | NASAL | Status: DC | PRN

## 2022-04-24 MED ORDER — SUCCINYLCHOLINE CHLORIDE 200 MG/10ML IV SOSY
PREFILLED_SYRINGE | INTRAVENOUS | Status: AC
  Filled 2022-04-24: qty 10

## 2022-04-24 MED ORDER — SODIUM CHLORIDE 0.9 % IV SOLN: INTRAVENOUS | Status: DC

## 2022-04-24 MED ORDER — BUPIVACAINE-EPINEPHRINE (PF) 0.25% -1:200000 IJ SOLN
INTRAMUSCULAR | Status: AC
  Filled 2022-04-24: qty 30

## 2022-04-24 MED ORDER — MIRABEGRON ER 25 MG PO TB24: 25.0000 mg | ORAL_TABLET | Freq: Every day | ORAL | Status: DC

## 2022-04-24 MED ORDER — PHENYLEPHRINE 80 MCG/ML (10ML) SYRINGE FOR IV PUSH (FOR BLOOD PRESSURE SUPPORT)
PREFILLED_SYRINGE | INTRAVENOUS | Status: AC
  Filled 2022-04-24: qty 10

## 2022-04-24 MED ORDER — DEXAMETHASONE SODIUM PHOSPHATE 10 MG/ML IJ SOLN
INTRAMUSCULAR | Status: DC | PRN
  Administered 2022-04-24: 8 mg via INTRAVENOUS

## 2022-04-24 MED ORDER — METHOCARBAMOL 500 MG PO TABS
500.0000 mg | ORAL_TABLET | Freq: Four times a day (QID) | ORAL | Status: DC | PRN
  Administered 2022-04-24: 500 mg via ORAL
  Filled 2022-04-24: qty 1

## 2022-04-24 MED ORDER — TRANEXAMIC ACID-NACL 1000-0.7 MG/100ML-% IV SOLN
INTRAVENOUS | Status: DC | PRN
  Administered 2022-04-24: 1000 mg via INTRAVENOUS

## 2022-04-24 MED ORDER — METHOCARBAMOL 500 MG IVPB - SIMPLE MED: 500.0000 mg | Freq: Four times a day (QID) | INTRAVENOUS | Status: DC | PRN

## 2022-04-24 MED ORDER — SPIRONOLACTONE 25 MG PO TABS
50.0000 mg | ORAL_TABLET | Freq: Every day | ORAL | Status: DC
  Administered 2022-04-24 – 2022-04-25 (×2): 50 mg via ORAL
  Filled 2022-04-24 (×2): qty 2

## 2022-04-24 MED ORDER — HYDROMORPHONE HCL 1 MG/ML IJ SOLN: 0.2500 mg | INTRAMUSCULAR | Status: DC | PRN

## 2022-04-24 MED ORDER — ASCORBIC ACID 500 MG PO TABS
2000.0000 mg | ORAL_TABLET | Freq: Every day | ORAL | Status: DC
  Administered 2022-04-24 – 2022-04-25 (×2): 2000 mg via ORAL
  Filled 2022-04-24 (×2): qty 4

## 2022-04-24 MED ORDER — TRAMADOL HCL 50 MG PO TABS
50.0000 mg | ORAL_TABLET | Freq: Four times a day (QID) | ORAL | Status: DC | PRN
  Administered 2022-04-24: 50 mg via ORAL
  Filled 2022-04-24: qty 1

## 2022-04-24 MED ORDER — TRAMADOL HCL 50 MG PO TABS: 50.0000 mg | ORAL_TABLET | Freq: Four times a day (QID) | ORAL | 0 refills | Status: AC | PRN

## 2022-04-24 MED ORDER — MENTHOL 3 MG MT LOZG: 1.0000 | LOZENGE | OROMUCOSAL | Status: DC | PRN

## 2022-04-24 SURGICAL SUPPLY — 77 items
AID PSTN UNV HD RSTRNT DISP (MISCELLANEOUS) ×1
BAG COUNTER SPONGE SURGICOUNT (BAG) ×1 IMPLANT
BAG SPEC THK2 15X12 ZIP CLS (MISCELLANEOUS) ×1
BAG SPNG CNTER NS LX DISP (BAG) ×1
BAG ZIPLOCK 12X15 (MISCELLANEOUS) ×1 IMPLANT
BIT DRILL 1.6MX128 (BIT) IMPLANT
BIT DRILL 170X2.5X (BIT) IMPLANT
BIT DRL 170X2.5X (BIT) ×1
BLADE SAG 18X100X1.27 (BLADE) ×2 IMPLANT
COVER BACK TABLE 60X90IN (DRAPES) ×2 IMPLANT
COVER SURGICAL LIGHT HANDLE (MISCELLANEOUS) ×2 IMPLANT
DRAPE INCISE IOBAN 66X45 STRL (DRAPES) ×2 IMPLANT
DRAPE ORTHO SPLIT 77X108 STRL (DRAPES) ×4
DRAPE SHEET LG 3/4 BI-LAMINATE (DRAPES) ×2 IMPLANT
DRAPE SURG ORHT 6 SPLT 77X108 (DRAPES) ×2 IMPLANT
DRAPE TOP 10253 STERILE (DRAPES) ×2 IMPLANT
DRAPE U-SHAPE 47X51 STRL (DRAPES) ×2 IMPLANT
DRILL 2.5 (BIT) ×2
DRSG ADAPTIC 3X8 NADH LF (GAUZE/BANDAGES/DRESSINGS) ×2 IMPLANT
DRSG PAD ABDOMINAL 8X10 ST (GAUZE/BANDAGES/DRESSINGS) ×2 IMPLANT
DURAPREP 26ML APPLICATOR (WOUND CARE) ×2 IMPLANT
ELECT BLADE TIP CTD 4 INCH (ELECTRODE) ×2 IMPLANT
ELECT NDL TIP 2.8 STRL (NEEDLE) ×1 IMPLANT
ELECT NEEDLE TIP 2.8 STRL (NEEDLE) ×2 IMPLANT
ELECT PENCIL ROCKER SW 15FT (MISCELLANEOUS) ×1 IMPLANT
ELECT REM PT RETURN 15FT ADLT (MISCELLANEOUS) ×2 IMPLANT
EPI LT SZ 1 (Orthopedic Implant) ×2 IMPLANT
EPIPHYSIS LT SZ 1 (Orthopedic Implant) IMPLANT
FACESHIELD WRAPAROUND (MASK) ×2 IMPLANT
FACESHIELD WRAPAROUND OR TEAM (MASK) ×1 IMPLANT
GAUZE SPONGE 4X4 12PLY STRL (GAUZE/BANDAGES/DRESSINGS) ×2 IMPLANT
GLENOSPHERE LATRLZD 38 SHLDR (Miscellaneous) ×1 IMPLANT
GLOVE BIOGEL PI IND STRL 7.5 (GLOVE) ×1 IMPLANT
GLOVE BIOGEL PI IND STRL 8.5 (GLOVE) ×1 IMPLANT
GLOVE BIOGEL PI INDICATOR 7.5 (GLOVE) ×1
GLOVE BIOGEL PI INDICATOR 8.5 (GLOVE) ×1
GLOVE ORTHO TXT STRL SZ7.5 (GLOVE) ×2 IMPLANT
GLOVE SURG ORTHO 8.5 STRL (GLOVE) ×2 IMPLANT
GOWN STRL REUS W/ TWL XL LVL3 (GOWN DISPOSABLE) ×2 IMPLANT
GOWN STRL REUS W/TWL XL LVL3 (GOWN DISPOSABLE) ×4
KIT BASIN OR (CUSTOM PROCEDURE TRAY) ×2 IMPLANT
KIT TURNOVER KIT A (KITS) IMPLANT
MANIFOLD NEPTUNE II (INSTRUMENTS) ×2 IMPLANT
METAGLENE DELTA EXTEND (Trauma) IMPLANT
METAGLENE DXTEND (Trauma) ×2 IMPLANT
NDL MAYO CATGUT SZ4 TPR NDL (NEEDLE) ×1 IMPLANT
NEEDLE MAYO CATGUT SZ4 (NEEDLE) ×2 IMPLANT
NS IRRIG 1000ML POUR BTL (IV SOLUTION) ×2 IMPLANT
PACK SHOULDER (CUSTOM PROCEDURE TRAY) ×2 IMPLANT
PIN GUIDE 1.2 (PIN) ×1 IMPLANT
PIN GUIDE GLENOPHERE 1.5MX300M (PIN) ×1 IMPLANT
PIN METAGLENE 2.5 (PIN) ×1 IMPLANT
PROTECTOR NERVE ULNAR (MISCELLANEOUS) ×2 IMPLANT
RESTRAINT HEAD UNIVERSAL NS (MISCELLANEOUS) ×2 IMPLANT
SCREW 4.5X36MM (Screw) ×1 IMPLANT
SCREW LOCK DELTA XTEND 4.5X30 (Screw) ×1 IMPLANT
SLING ARM FOAM STRAP LRG (SOFTGOODS) IMPLANT
SLING ARM FOAM STRAP MED (SOFTGOODS) ×1 IMPLANT
SMARTMIX MINI TOWER (MISCELLANEOUS)
SPACER 38 PLUS 3 (Spacer) ×1 IMPLANT
SPIKE FLUID TRANSFER (MISCELLANEOUS) ×2 IMPLANT
SPONGE T-LAP 4X18 ~~LOC~~+RFID (SPONGE) ×1 IMPLANT
STEM DELTA DIA 10 HA (Stem) ×1 IMPLANT
STRIP CLOSURE SKIN 1/2X4 (GAUZE/BANDAGES/DRESSINGS) ×2 IMPLANT
SUCTION FRAZIER HANDLE 10FR (MISCELLANEOUS) ×2
SUCTION TUBE FRAZIER 10FR DISP (MISCELLANEOUS) ×1 IMPLANT
SUT FIBERWIRE #2 38 T-5 BLUE (SUTURE) ×4
SUT MNCRL AB 4-0 PS2 18 (SUTURE) ×2 IMPLANT
SUT VIC AB 0 CT1 36 (SUTURE) ×4 IMPLANT
SUT VIC AB 0 CT2 27 (SUTURE) ×2 IMPLANT
SUT VIC AB 2-0 CT1 27 (SUTURE) ×2
SUT VIC AB 2-0 CT1 TAPERPNT 27 (SUTURE) ×1 IMPLANT
SUTURE FIBERWR #2 38 T-5 BLUE (SUTURE) ×2 IMPLANT
TAPE CLOTH SURG 6X10 WHT LF (GAUZE/BANDAGES/DRESSINGS) ×1 IMPLANT
TOWEL OR 17X26 10 PK STRL BLUE (TOWEL DISPOSABLE) ×2 IMPLANT
TOWER SMARTMIX MINI (MISCELLANEOUS) IMPLANT
WATER STERILE IRR 1000ML POUR (IV SOLUTION) ×2 IMPLANT

## 2022-04-24 NOTE — Op Note (Signed)
NAME: Darlene Delacruz, Darlene Delacruz MEDICAL RECORD NO: [id number] ACCOUNT NO: [id number] DATE OF BIRTH: [birth year] FACILITY: [facility]ATION: [id number] PHYSICIAN: Doran Heater. Veverly Fells, MD  Operative Report   DATE OF PROCEDURE: 04/24/2022  PREOPERATIVE DIAGNOSES:  Left shoulder fracture with resulting malunion and end-stage arthritis with rotator cuff insufficiency.  POSTOPERATIVE DIAGNOSES:  Left shoulder fracture with resulting malunion and end-stage arthritis with rotator cuff insufficiency.  PROCEDURE PERFORMED:  Left reverse shoulder replacement using DePuy Delta Xtend prosthesis with no subscap repair.  ATTENDING SURGEON:  Doran Heater. Veverly Fells, MD  ASSISTANT:  Charletta Cousin [place], [place], who was scrubbed during the entire procedure, and necessary for satisfactory completion of surgery.  ANESTHESIA:  General anesthesia was used plus interscalene block.  ESTIMATED BLOOD LOSS:  200 mL.  FLUID REPLACEMENT:  1200 mL crystalloid.  COUNTS:  Instrument counts were correct.  COMPLICATIONS:  No complications.  ANTIBIOTICS:  Perioperative antibiotics were given.  INDICATIONS:  The patient is a 76 year old female who suffered a left shoulder injury in which she had a head split type fracture with impaction.  The patient wound up healing that with some incongruity of her humeral articular surface.  The patient went  on to develop decreasing function and increasing pain over the last number of months to the point where it interferes with quality of life and ADLs.  Given her persistent pain despite conservative measures, the patient presents for reverse shoulder  replacement to restore fixed focal mechanics and a smooth bearing surface in her shoulder and eliminate pain and improve quality of life.  Informed consent obtained.  DESCRIPTION OF PROCEDURE:  After an adequate level of anesthesia was achieved, the patient was positioned in modified beach chair position.  Left shoulder correctly identified and sterilely prepped  and draped in the usual manner.  Timeout called,  verifying correct patient, correct site. We entered the shoulder using a standard deltopectoral approach, starting at the coracoid process extending down the anterior humerus.  Dissection down through subcutaneous tissues.  Cephalic vein was identified  and taken laterally with the deltoid pectoralis taken medially.  Conjoined tendon identified and retracted medially.  We tenodesed the biceps in situ with 0 Vicryl figure-of-eight suture x2 incorporating part of the pec tendon.  We then released the  subscapularis remnant off the lesser tuberosity and tagged for protection of the axillary nerve.  We then released the inferior capsule progressively externally rotating delivering the humeral head out of the wound.  The impacted humeral head segment was  about 3 mm displaced from the remaining head that was attached to the greater tuberosity.  This was clearly an incongruent joint with significant cartilage loss.  We had the superior portion of the humerus with a 6 mm reamer, reaming up to a size 10.   We then placed our 10 mm guide and resected the head at 20 degrees retroversion with the oscillating saw. We removed excess osteophytes with a rongeur.  We then subluxed the humerus posteriorly and getting good exposure of the glenoid face.  We removed  the cartilage and the labrum and the capsule, protecting the axillary nerve.  We placed our deep retractors.  We then found our center point for a guidepin and then we reamed for the metaglene baseplate off the guidepin and then did our peripheral hand  reaming and then did our central peg hole with the large drill over the guidepin.  We removed the guidepin.  We irrigated thoroughly and then impacted the HA coated baseplate, referencing off the  6 o'clock position.  We placed a 36 screw inferiorly with  good purchase and a 30 screw superiorly with good purchase.  The patient's AP diameter was such we could not place  an anterior and posterior screws, but we had good baseplate secured. We selected a 38 eccentric +0 glenosphere and attached that to the  baseplate with the eccentricity dialed inferiorly to give extra clearance below the scapular neck.  After we had that in place, did a final finger sweep to make sure that there was no soft tissue caught up in the implant.  We irrigated thoroughly.  We  then went back to the humeral side and reamed for the one left metaphysis.  We then trialled with a 10 stem, 1 left set on the 0 setting and impacted in 20 degrees of retroversion.  We then placed a 38+3 poly trial on the humeral tray and reduced the  shoulder.  We were pleased with our soft tissue balancing and stability.  We removed the trial components, irrigating thoroughly.  Selected the real HA coated 10 stem with the one left metaphysis and we set on the 0 setting.  We then used available bone  graft from the humeral head and used impaction grafting technique and impacted the HA coated stem press-fit style with 20 degrees of retroversion.  We were pleased with our stem security.  We selected the real 38+3 poly impacted down the humeral tray,  reduced the shoulder and had excellent stability and excellent range of motion, nice and smooth with no tension and no impingement.  We irrigated thoroughly again and repaired the deltopectoral interval with 0 Vicryl suture followed by 2-0 Vicryl for  subcutaneous closure and 4-0 Monocryl for skin.  Steri-Strips were applied followed by sterile dressing.  The patient tolerated surgery well.   NIK D: 04/24/2022 9:08:12 am T: 04/24/2022 10:45:00 am  JOB: [id number]/ [id number]

## 2022-04-24 NOTE — Interval H&P Note (Signed)
History and Physical Interval Note:  04/24/2022 7:17 AM  Darlene Delacruz  has presented today for surgery, with the diagnosis of Degenerative joint disease of shoulder region.  The various methods of treatment have been discussed with the patient and family. After consideration of risks, benefits and other options for treatment, the patient has consented to  Procedure(s) with comments: REVERSE SHOULDER ARTHROPLASTY (Left) - with ISB as a surgical intervention.  The patient's history has been reviewed, patient examined, no change in status, stable for surgery.  I have reviewed the patient's chart and labs.  Questions were answered to the patient's satisfaction.     Augustin Schooling

## 2022-04-24 NOTE — Anesthesia Procedure Notes (Signed)
Anesthesia Regional Block: Interscalene brachial plexus block   Pre-Anesthetic Checklist: , timeout performed,  Correct Patient, Correct Site, Correct Laterality,  Correct Procedure, Correct Position, site marked,  Risks and benefits discussed,  Surgical consent,  Pre-op evaluation,  At surgeon's request and post-op pain management  Laterality: Left  Prep: chloraprep       Needles:  Injection technique: Single-shot  Needle Type: Stimiplex     Needle Length: 9cm  Needle Gauge: 21     Additional Needles:   Narrative:  Start time: 04/24/2022 7:15 AM End time: 04/24/2022 7:20 AM Injection made incrementally with aspirations every 5 mL.  Performed by: Personally  Anesthesiologist: Lynda Rainwater, MD

## 2022-04-24 NOTE — Anesthesia Postprocedure Evaluation (Signed)
Anesthesia Post Note  Patient: Darlene Delacruz  Procedure(s) Performed: REVERSE SHOULDER ARTHROPLASTY (Left: Shoulder)     Patient location during evaluation: PACU Anesthesia Type: General Level of consciousness: awake and alert Pain management: pain level controlled Vital Signs Assessment: post-procedure vital signs reviewed and stable Respiratory status: spontaneous breathing, nonlabored ventilation and respiratory function stable Cardiovascular status: blood pressure returned to baseline and stable Postop Assessment: no apparent nausea or vomiting Anesthetic complications: no   No notable events documented.  Last Vitals:  Vitals:   04/24/22 0930 04/24/22 1015  BP: 137/74 133/65  Pulse: 79 76  Resp: 19 16  Temp: (!) 36.4 C (!) 36.4 C  SpO2: 93% 93%    Last Pain:  Vitals:   04/24/22 1015  TempSrc:   PainSc: 0-No pain                 Lynda Rainwater

## 2022-04-24 NOTE — Anesthesia Preprocedure Evaluation (Signed)
Anesthesia Evaluation  Patient identified by MRN, date of birth, ID band Patient awake    Reviewed: Allergy & Precautions, H&P , NPO status , Patient's Chart, lab work & pertinent test results  Airway Mallampati: II  TM Distance: >3 FB Neck ROM: full    Dental no notable dental hx.    Pulmonary former smoker,    Pulmonary exam normal breath sounds clear to auscultation       Cardiovascular hypertension, Pt. on medications Normal cardiovascular exam+ Valvular Problems/Murmurs AS  Rhythm:regular Rate:Normal  Mild AS   Neuro/Psych PSYCHIATRIC DISORDERS Anxiety    GI/Hepatic GERD  ,(+) Cirrhosis       ,   Endo/Other    Renal/GU      Musculoskeletal  (+) Arthritis , Osteoarthritis,    Abdominal (+) + obese,   Peds  Hematology   Anesthesia Other Findings   Reproductive/Obstetrics                             Anesthesia Physical  Anesthesia Plan  ASA: III  Anesthesia Plan: General ETT   Post-op Pain Management: Regional block*, Dilaudid IV and Minimal or no pain anticipated   Induction: Intravenous  PONV Risk Score and Plan: 3 and Treatment may vary due to age or medical condition, Ondansetron, Dexamethasone and Midazolam  Airway Management Planned: Oral ETT  Additional Equipment:   Intra-op Plan:   Post-operative Plan:   Informed Consent: I have reviewed the patients History and Physical, chart, labs and discussed the procedure including the risks, benefits and alternatives for the proposed anesthesia with the patient or authorized representative who has indicated his/her understanding and acceptance.     Dental Advisory Given  Plan Discussed with: CRNA  Anesthesia Plan Comments: (H/o hepatic encephalopathy by records, but patient adds h/o multiple admissions around that time that include pneumonia, which we have no records of.  Reviewed most recent labs from OSH which show a  persistent thrombocytopenia of 90-100, and mildly decreased Albumin of 3.3.  In absence of ascites, and no history of easy bleeding, it appears hepatic synthetic function is currently sufficient for normal coagulation, although we have no direct measure.  )        Anesthesia Quick Evaluation

## 2022-04-24 NOTE — Care Plan (Signed)
Ortho Bundle Case Management Note  Patient Details  Name: Darlene Delacruz MRN: [id number] Date of Birth: 1946/10/13                  L rev TSA on 04/24/22. DCP: [facility]. Daughter will also be checking in on her. DME: Sling and ice machine to be given at hospital. PT: HEP   DME Arranged:  N/A DME Agency:     HH Arranged:    [place] Agency:     Additional Comments: Please contact me with any questions of if this plan should need to change.  Marianne Sofia, RN,CCM [facility]  [phone] 04/24/2022, 12:12 PM

## 2022-04-24 NOTE — Anesthesia Procedure Notes (Signed)
Procedure Name: Intubation Date/Time: 04/24/2022 7:34 AM Performed by: Jonna Munro, CRNA Pre-anesthesia Checklist: Patient identified, Emergency Drugs available, Suction available, Patient being monitored and Timeout performed Patient Re-evaluated:Patient Re-evaluated prior to induction Oxygen Delivery Method: Circle system utilized Preoxygenation: Pre-oxygenation with 100% oxygen Induction Type: IV induction Ventilation: Mask ventilation without difficulty Laryngoscope Size: Mac and 3 Grade View: Grade I Tube type: Oral Tube size: 7.0 mm Number of attempts: 1 Airway Equipment and Method: Stylet Placement Confirmation: ETT inserted through vocal cords under direct vision, positive ETCO2 and breath sounds checked- equal and bilateral Secured at: 22 cm Tube secured with: Tape Dental Injury: Teeth and Oropharynx as per pre-operative assessment

## 2022-04-24 NOTE — Discharge Instructions (Signed)
Ice to the shoulder constantly.  Keep the incision covered and clean and dry for one week, then ok to get it wet in the shower. ° °Do exercise as instructed several times per day. ° °DO NOT reach behind your back or push up out of a chair with the operative arm. ° °Use a sling while you are up and around for comfort, may remove while seated.  Keep pillow propped behind the operative elbow. ° °Follow up with Dr Lezette Kitts in two weeks in the office, call [phone] for appt °

## 2022-04-24 NOTE — Brief Op Note (Signed)
04/24/2022  8:56 AM  PATIENT:  Darlene Delacruz  76 y.o. female  PRE-OPERATIVE DIAGNOSIS:  Degenerative joint disease of shoulder region, end stage, rotator cuff tear arthropathy  POST-OPERATIVE DIAGNOSIS:  same  PROCEDURE:  Procedure(s) with comments: REVERSE SHOULDER ARTHROPLASTY (Left) - with ISB DePuy Delta xtend with no subscap repair  SURGEON:  Surgeon(s) and Role:    Netta Cedars, MD - Primary  PHYSICIAN ASSISTANT:   ASSISTANTS: [place] Bruns, PA-C   ANESTHESIA:   regional and general  EBL:  200 mL   BLOOD ADMINISTERED:none  DRAINS: none   LOCAL MEDICATIONS USED:  [place]     SPECIMEN:  No Specimen  DISPOSITION OF SPECIMEN:  N/A  COUNTS:  YES  TOURNIQUET:  * No tourniquets in log *  DICTATION: .Other Dictation: Dictation Number [id number]  PLAN OF CARE: Admit for overnight observation  PATIENT DISPOSITION:  PACU - hemodynamically stable.   Delay start of Pharmacological VTE agent (>24hrs) due to surgical blood loss or risk of bleeding: not applicable

## 2022-04-24 NOTE — Transfer of Care (Signed)
Immediate Anesthesia Transfer of Care Note  Patient: Darlene Delacruz  Procedure(s) Performed: REVERSE SHOULDER ARTHROPLASTY (Left: Shoulder)  Patient Location: PACU  Anesthesia Type:General  Level of Consciousness: awake, alert , oriented and patient cooperative  Airway & Oxygen Therapy: Patient Spontanous Breathing and Patient connected to face mask oxygen  Post-op Assessment: Report given to RN and Post -op Vital signs reviewed and stable  Post vital signs: Reviewed and stable  Last Vitals:  Vitals Value Taken Time  BP    Temp    Pulse 93 04/24/22 0900  Resp    SpO2 96 % 04/24/22 0900  Vitals shown include unvalidated device data.  Last Pain:  Vitals:   04/24/22 0539  TempSrc:   PainSc: 0-No pain      Patients Stated Pain Goal: 3 (19/41/74 0814)  Complications: No notable events documented.

## 2022-04-25 ENCOUNTER — Encounter (HOSPITAL_COMMUNITY): Payer: Self-pay | Admitting: Emergency Medicine

## 2022-04-25 ENCOUNTER — Emergency Department (HOSPITAL_COMMUNITY)
Admission: EM | Admit: 2022-04-25 | Discharge: 2022-04-25 | Disposition: A | Discharge status: Home / Self Care | Payer: Medicare Other | Source: Home / Self Care | Attending: Student | Admitting: Student

## 2022-04-25 ENCOUNTER — Other Ambulatory Visit: Payer: Self-pay

## 2022-04-25 DIAGNOSIS — M19012 Primary osteoarthritis, left shoulder: Secondary | ICD-10-CM | POA: Diagnosis not present

## 2022-04-25 DIAGNOSIS — R21 Rash and other nonspecific skin eruption: Secondary | ICD-10-CM

## 2022-04-25 LAB — BASIC METABOLIC PANEL
Anion gap: 5 (ref 5–15)
BUN: 13 mg/dL (ref 8–23)
CO2: 24 mmol/L (ref 22–32)
Calcium: 9.3 mg/dL (ref 8.9–10.3)
Chloride: 109 mmol/L (ref 98–111)
Creatinine, Ser: 0.7 mg/dL (ref 0.44–1.00)
GFR, Estimated: >60 mL/min (ref >60)
Glucose, Bld: 149 mg/dL — ABNORMAL HIGH (ref 70–99)
Potassium: 4.4 mmol/L (ref 3.5–5.1)
Sodium: 138 mmol/L (ref 135–145)

## 2022-04-25 LAB — HEMOGLOBIN AND HEMATOCRIT, BLOOD
HCT: 36.7 % (ref 36.0–46.0)
Hemoglobin: 12 g/dL (ref 12.0–15.0)

## 2022-04-25 NOTE — ED Triage Notes (Signed)
Pt was discharged about 11 a.m. today from shoulder surgery on the left.  Pt states they applied a new dressing this morning and since she has been home she has had progressively worsening redness to face and now to her entire body. No pain. No itching, No shob. No numbness, tingling or other symptoms suggestive of anaphylaxis.

## 2022-04-25 NOTE — Progress Notes (Signed)
   Subjective: 1 Day Post-Op Procedure(s) (LRB): REVERSE SHOULDER ARTHROPLASTY (Left) Patient reports pain as mild. Patient seen in rounds for Dr. Veverly Fells. Patient is sitting in the recliner this morning, dressed and ready to go home. She reports the block is still working somewhat. No acute events overnight.  We will continue therapy today.   Objective: Vital signs in last 24 hours: Temp:  [97.7 F (36.5 C)-98 F (36.7 C)] 98 F (36.7 C) (05/20 0948) Pulse Rate:  [66-78] 72 (05/20 0948) Resp:  [17-18] 18 (05/20 0948) BP: (125-132)/(53-65) 127/53 (05/20 0948) SpO2:  [93 %-96 %] 96 % (05/20 0948)  Intake/Output from previous day:  Intake/Output Summary (Last 24 hours) at 04/25/2022 1020 Last data filed at 04/25/2022 0750 Gross per 24 hour  Intake 1353.2 ml  Output 600 ml  Net 753.2 ml     Intake/Output this shift: Total I/O In: -  Out: 300 [Urine:300]  Labs: Recent Labs    04/25/22 0339  HGB 12.0   Recent Labs    04/25/22 0339  HCT 36.7   Recent Labs    04/25/22 0339  NA 138  K 4.4  CL 109  CO2 24  BUN 13  CREATININE 0.70  GLUCOSE 149*  CALCIUM 9.3   No results for input(s): LABPT, INR in the last 72 hours.  Exam: General - Patient is Alert and Oriented Extremity - Neurologically intact Sensation intact distally Intact pulses distally  Dressing - dressing C/D/I. Changed to Aquacel dressing. Motor Function - intact, moving fingers well on exam. Full grip strength.   Past Medical History:  Diagnosis Date   Alcoholic cirrhosis ([place])    Alcoholism ([place])    Arthritis    Family history of adverse reaction to anesthesia    Mother - PONV   GERD (gastroesophageal reflux disease)    Hyperlipidemia    Hypertension    Pneumonia    Spastic esophagus     Assessment/Plan: 1 Day Post-Op Procedure(s) (LRB): REVERSE SHOULDER ARTHROPLASTY (Left) Principal Problem:   H/O total shoulder replacement, left  Estimated body mass index is 30 kg/m as  calculated from the following:   Height as of this encounter: '5\' 4"'$  (1.626 m).   Weight as of this encounter: 79.3 kg. Advance diet Up with therapy  Weight bearing as tolerated.  Plan is to go Home after hospital stay. Plan for discharge today after meeting goals with therapy. Follow up in the office in 2 weeks.   Griffith Citron, PA-C Orthopedic Surgery [phone] 04/25/2022, 10:20 AM

## 2022-04-25 NOTE — Discharge Instructions (Signed)
It's uncertain what the cause of your rash is.  If you develop fever, pain, or worsening symptoms please return or call your doctor.    Please make a follow-up appointment with your doctor next week.

## 2022-04-25 NOTE — Progress Notes (Signed)
Transition of Care [facility]) Screening Note  Patient Details  Name: Darlene Delacruz Date of Birth: January 18, 1946  [facility]) CM/SW Contact:    Sherie Don, LCSW Phone Number: 04/25/2022, 1:20 PM  Transition of Care Department [facility]) has reviewed patient and no TOC needs have been identified at this time. We will continue to monitor patient advancement through interdisciplinary progression rounds. If new patient transition needs arise, please place a TOC consult.

## 2022-04-25 NOTE — ED Provider Notes (Signed)
[facility] Emergency Department Provider Note MRN:  [id number]  Stella date & time: 04/25/22     Chief Complaint   Possible allergic reaction   History of Present Illness   Darlene Delacruz is a 76 y.o. year-old female presents to the ED with chief complaint of redness to her face.  She states that she had shoulder surgery yesterday and today she noticed the redness on her face and on her neck.  She denies any medication changes.  Denies nausea, vomiting, diarrhea, SOB, throat swelling.  Denies having taken anything for her symptoms.  She received Ancef in surgery yesterday.  History provided by patient.   Review of Systems  Pertinent review of systems noted in HPI.    Physical Exam   Vitals:   04/25/22 1922  BP: (!) 188/75  Pulse: (!) 106  Resp: 16  Temp: 98 F (36.7 C)  SpO2: 96%    CONSTITUTIONAL:  well-appearing, NAD NEURO:  Alert and oriented x 3, CN 3-12 grossly intact EYES:  eyes equal and reactive ENT/NECK:  Supple, no stridor  CARDIO:  normal rate on my exam, regular rhythm, appears well-perfused  PULM:  No respiratory distress,  GI/GU:  non-distended,  MSK/SPINE:  No gross deformities, no edema, moves all extremities  SKIN:  no rash, atraumatic   *Additional and/or pertinent findings included in MDM below  Diagnostic and Interventional Summary    EKG Interpretation  Date/Time:    Ventricular Rate:    PR Interval:    QRS Duration:   QT Interval:    QTC Calculation:   R Axis:     Text Interpretation:         Labs Reviewed - No data to display  No orders to display    Medications - No data to display   Procedures  /  Critical Care Procedures  ED Course and Medical Decision Making  I have reviewed the triage vital signs, the nursing notes, and pertinent available records from the EMR.  Social Determinants Affecting Complexity of Care: Patient has no clinically significant social determinants affecting this chief  complaint..   ED Course:   Patient here with rash/erythema to face.  Top differential diagnoses include allergic reaction, infection. Medical Decision Making Rash doesn't appear consistent with cellulitis, shingles, or contact dermatitis.  Doesn't appear consistent with allergic response.   Problems Addressed: Rash: undiagnosed new problem with uncertain prognosis     Consultants: No consultations were needed in caring for this patient.   Treatment and Plan: Emergency department workup does not suggest an emergent condition requiring admission or immediate intervention beyond  what has been performed at this time. The patient is safe for discharge and has  been instructed to return immediately for worsening symptoms, change in  symptoms or any other concerns  Patient seen by and discussed with attending physician, Dr. Matilde Sprang, who agrees with plan for discharge, uncertain etiology, but doesn't seem consistent with any emergent condition.  Final Clinical Impressions(s) / ED Diagnoses     ICD-10-CM   1. Rash  R21       ED Discharge Orders     None         Discharge Instructions Discussed with and Provided to Patient:     Discharge Instructions      It's uncertain what the cause of your rash is.  If you develop fever, pain, or worsening symptoms please return or call your doctor.    Please make a follow-up appointment  with your doctor next week.       Montine Circle, PA-C 04/25/22 2228    Teressa Lower, MD 04/26/22 [phone]

## 2022-04-25 NOTE — Plan of Care (Signed)
  Problem: Education: Goal: Knowledge of the prescribed therapeutic regimen will improve Outcome: Progressing   Problem: Activity: Goal: Ability to tolerate increased activity will improve Outcome: Progressing   Problem: Pain Management: Goal: Pain level will decrease with appropriate interventions Outcome: Progressing   

## 2022-04-25 NOTE — Progress Notes (Signed)
Pt stable at time of discharge. Surgical dressing clean, dry intact. No questions or concerns at the time of instructions. Pt belongings sent with Pt.

## 2022-04-25 NOTE — Evaluation (Signed)
Occupational Therapy Evaluation Patient Details Name: Darlene Delacruz MRN: [id number] DOB: 06-12-1946 Today's Date: 04/25/2022   History of Present Illness Patient is a 76 year old woman s/p left reverse TSA.   Clinical Impression   Mrs. Leanny Moeckel is a 76 year old woman s/p shoulder replacement without functional use of left dominant upper extremity secondary to effects of surgery and interscalene block and shoulder precautions. Therapist provided education and instruction to patient in regards to exercises, precautions, positioning, donning upper extremity clothing and bathing while maintaining shoulder precautions, ice and edema management and donning/doffing sling. Patient verbalized understanding and demonstrated as needed. Patient needed min assistance to donn shirt and sling but otherwise functional. Patient lives alone at independent living but did well during recovery of broken arm prior to surgery and has prior knowledge of compensatory strategies. Reports neighbors and nursing staff can assist if needed and she can order food from dining room. Patient to follow up with MD for further therapy needs.        Recommendations for follow up therapy are one component of a multi-disciplinary discharge planning process, led by the attending physician.  Recommendations may be updated based on patient status, additional functional criteria and insurance authorization.   Follow Up Recommendations  Follow physician's recommendations for discharge plan and follow up therapies    Assistance Recommended at Discharge PRN  Patient can return home with the following A little help with bathing/dressing/bathroom;Assistance with cooking/housework    Functional Status Assessment  Patient has had a recent decline in their functional status and demonstrates the ability to make significant improvements in function in a reasonable and predictable amount of time.  Equipment Recommendations  None recommended  by OT    Recommendations for Other Services       Precautions / Restrictions Precautions Precautions: Shoulder Type of Shoulder Precautions: No AROM, No PROM Shoulder Interventions: Shoulder sling/immobilizer;Off for dressing/bathing/exercises Precaution Booklet Issued:  (handouts) Required Braces or Orthoses: Sling Restrictions Weight Bearing Restrictions: Yes LUE Weight Bearing: Non weight bearing      Mobility Bed Mobility               General bed mobility comments: OOB    Transfers Overall transfer level: Modified independent                        Balance Overall balance assessment: Mild deficits observed, not formally tested                                         ADL either performed or assessed with clinical judgement   ADL                                               Vision Baseline Vision/History: 1 Wears glasses Patient Visual Report: No change from baseline       Perception     Praxis      Pertinent Vitals/Pain Pain Assessment Pain Assessment: No/denies pain     Hand Dominance Right   Extremity/Trunk Assessment Upper Extremity Assessment Upper Extremity Assessment: LUE deficits/detail LUE Deficits / Details: Impaired motor conrol and sensation secondary to block   Lower Extremity Assessment Lower Extremity Assessment: Overall WFL for tasks assessed   Cervical /  Trunk Assessment Cervical / Trunk Assessment: Normal   Communication Communication Communication: No difficulties   Cognition Arousal/Alertness: Awake/alert Behavior During Therapy: WFL for tasks assessed/performed Overall Cognitive Status: Within Functional Limits for tasks assessed                                       General Comments       Exercises     Shoulder Instructions Shoulder Instructions Donning/doffing shirt without moving shoulder: Modified independent Method for sponge bathing under  operated UE: Independent Donning/doffing sling/immobilizer: Modified independent Correct positioning of sling/immobilizer: Independent ROM for elbow, wrist and digits of operated UE: Independent Sling wearing schedule (on at all times/off for ADL's): Independent Proper positioning of operated UE when showering: Independent Dressing change: Independent Positioning of UE while sleeping: [place] expects to be discharged to:: Private residence ([place], [place]. Has in house therapy) Living Arrangements: Alone Available Help at Discharge: Family;Available PRN/intermittently Type of Home: Apartment Home Access: Elevator     Home Layout: One level     Bathroom Shower/Tub: Occupational psychologist: Handicapped height     Home Equipment: Grab bars - tub/shower;Grab bars - toilet;Shower seat          Prior Functioning/Environment Prior Level of Function : Independent/Modified Independent             Mobility Comments: no device ADLs Comments: [place] in November playing golf. been modified independent        OT Problem List: Decreased strength;Decreased range of motion;Impaired UE functional use;Pain      OT Treatment/Interventions:      OT Goals(Current goals can be found in the care plan section) Acute Rehab OT Goals OT Goal Formulation: All assessment and education complete, DC therapy  OT Frequency:      Co-evaluation              AM-PAC OT "6 Clicks" Daily Activity     Outcome Measure Help from another person eating meals?: None Help from another person taking care of personal grooming?: None Help from another person toileting, which includes using toliet, bedpan, or urinal?: None Help from another person bathing (including washing, rinsing, drying)?: A Little Help from another person to put on and taking off regular upper body clothing?: A Little Help from another person to put on and taking  off regular lower body clothing?: None 6 Click Score: 22   End of Session Nurse Communication:  (OT education complete)  Activity Tolerance: Patient tolerated treatment well Patient left: in chair;with call bell/phone within reach  OT Visit Diagnosis: Muscle weakness (generalized) (M62.81)                Time: 1540-0867 OT Time Calculation (min): 36 min Charges:  OT General Charges $OT Visit: 1 Visit OT Evaluation $OT Eval Low Complexity: 1 Low OT Treatments $Self Care/Home Management : 8-22 mins  Yuleni Burich, OTR/L Acute Care Rehab Services  Office [phone] Pager: [phone]   Lenward Chancellor 04/25/2022, 10:22 AM

## 2022-04-28 DIAGNOSIS — R2681 Unsteadiness on feet: Secondary | ICD-10-CM | POA: Diagnosis not present

## 2022-04-28 DIAGNOSIS — M6281 Muscle weakness (generalized): Secondary | ICD-10-CM | POA: Diagnosis not present

## 2022-04-28 DIAGNOSIS — M9905 Segmental and somatic dysfunction of pelvic region: Secondary | ICD-10-CM | POA: Diagnosis not present

## 2022-04-28 DIAGNOSIS — Z9181 History of falling: Secondary | ICD-10-CM | POA: Diagnosis not present

## 2022-04-28 DIAGNOSIS — R2689 Other abnormalities of gait and mobility: Secondary | ICD-10-CM | POA: Diagnosis not present

## 2022-04-28 DIAGNOSIS — Z471 Aftercare following joint replacement surgery: Secondary | ICD-10-CM | POA: Diagnosis not present

## 2022-04-29 ENCOUNTER — Encounter (HOSPITAL_COMMUNITY): Payer: Self-pay | Admitting: Orthopedic Surgery

## 2022-04-30 DIAGNOSIS — R2681 Unsteadiness on feet: Secondary | ICD-10-CM | POA: Diagnosis not present

## 2022-04-30 DIAGNOSIS — Z471 Aftercare following joint replacement surgery: Secondary | ICD-10-CM | POA: Diagnosis not present

## 2022-04-30 DIAGNOSIS — Z9181 History of falling: Secondary | ICD-10-CM | POA: Diagnosis not present

## 2022-04-30 DIAGNOSIS — R2689 Other abnormalities of gait and mobility: Secondary | ICD-10-CM | POA: Diagnosis not present

## 2022-04-30 DIAGNOSIS — M6281 Muscle weakness (generalized): Secondary | ICD-10-CM | POA: Diagnosis not present

## 2022-04-30 DIAGNOSIS — M9905 Segmental and somatic dysfunction of pelvic region: Secondary | ICD-10-CM | POA: Diagnosis not present

## 2022-05-06 DIAGNOSIS — R2681 Unsteadiness on feet: Secondary | ICD-10-CM | POA: Diagnosis not present

## 2022-05-06 DIAGNOSIS — Z9181 History of falling: Secondary | ICD-10-CM | POA: Diagnosis not present

## 2022-05-06 DIAGNOSIS — M6281 Muscle weakness (generalized): Secondary | ICD-10-CM | POA: Diagnosis not present

## 2022-05-06 DIAGNOSIS — R2689 Other abnormalities of gait and mobility: Secondary | ICD-10-CM | POA: Diagnosis not present

## 2022-05-06 DIAGNOSIS — Z471 Aftercare following joint replacement surgery: Secondary | ICD-10-CM | POA: Diagnosis not present

## 2022-05-06 DIAGNOSIS — M9905 Segmental and somatic dysfunction of pelvic region: Secondary | ICD-10-CM | POA: Diagnosis not present

## 2022-05-06 NOTE — Discharge Summary (Signed)
In most cases prophylactic antibiotics for Dental procdeures after total joint surgery are not necessary.  Exceptions are as follows:  1. History of prior total joint infection  2. Severely immunocompromised (Organ Transplant, cancer chemotherapy, Rheumatoid biologic meds such as [place])  3. Poorly controlled diabetes (A1C &gt; 8.0, blood glucose over 200)  If you have one of these conditions, contact your surgeon for an antibiotic prescription, prior to your dental procedure. Orthopedic Discharge Summary        Physician Discharge Summary  Patient ID: Darlene Delacruz MRN: [id number] DOB/AGE: 1946-03-24 76 y.o.  Admit date: 04/24/2022 Discharge date: 04/25/22  Procedures:  Procedure(s) (LRB): REVERSE SHOULDER ARTHROPLASTY (Left)  Attending Physician:  Dr. Esmond Plants  Admission Diagnoses:   left shoulder rotator cuff arthropathy  Discharge Diagnoses:  left shoulder rotator cuff arthropathy   Past Medical History:  Diagnosis Date   Alcoholic cirrhosis ([place])    Alcoholism ([place])    Arthritis    Family history of adverse reaction to anesthesia    Mother - PONV   GERD (gastroesophageal reflux disease)    Hyperlipidemia    Hypertension    Pneumonia    Spastic esophagus     PCP: Janie Morning, DO   Discharged Condition: good  Hospital Course:  Patient underwent the above stated procedure on 04/24/2022. Patient tolerated the procedure well and brought to the recovery room in good condition and subsequently to the floor. Patient had an uncomplicated hospital course and was stable for discharge.   Disposition: Discharge disposition: 01-Home or Self Care      with follow up in 2 weeks    Follow-up Information     Netta Cedars, MD. Schedule an appointment as soon as possible for a visit in 2 week(s).   Specialty: Orthopedic Surgery Why: call [phone] for appt in two weeks Contact information: [address] [place]  [address] [phone]                 Dental Antibiotics:  In most cases prophylactic antibiotics for Dental procdeures after total joint surgery are not necessary.  Exceptions are as follows:  1. History of prior total joint infection  2. Severely immunocompromised (Organ Transplant, cancer chemotherapy, Rheumatoid biologic meds such as [place])  3. Poorly controlled diabetes (A1C &gt; 8.0, blood glucose over 200)  If you have one of these conditions, contact your surgeon for an antibiotic prescription, prior to your dental procedure.  Discharge Instructions     Call MD / Call 911   Complete by: As directed    If you experience chest pain or shortness of breath, CALL 911 and be transported to the hospital emergency room.  If you develope a fever above 101 F, pus (white drainage) or increased drainage or redness at the wound, or calf pain, call your surgeon's office.   Constipation Prevention   Complete by: As directed    Drink plenty of fluids.  Prune juice may be helpful.  You may use a stool softener, such as Colace (over the counter) 100 mg twice a day.  Use MiraLax (over the counter) for constipation as needed.   Diet - low sodium heart healthy   Complete by: As directed    Post-operative opioid taper instructions:   Complete by: As directed    POST-OPERATIVE OPIOID TAPER INSTRUCTIONS: It is important to wean off of your opioid medication as soon as possible. If you do not need pain medication after your surgery it  is ok to stop day one. Opioids include: Codeine, Hydrocodone(Norco, Vicodin), Oxycodone(Percocet, oxycontin) and hydromorphone amongst others.  Long term and even short term use of opiods can cause: Increased pain response Dependence Constipation Depression Respiratory depression And more.  Withdrawal symptoms can include Flu like symptoms Nausea, vomiting And more Techniques to manage these symptoms Hydrate well Eat regular healthy meals Stay  active Use relaxation techniques(deep breathing, meditating, yoga) Do Not substitute Alcohol to help with tapering If you have been on opioids for less than two weeks and do not have pain than it is ok to stop all together.  Plan to wean off of opioids This plan should start within one week post op of your joint replacement. Maintain the same interval or time between taking each dose and first decrease the dose.  Cut the total daily intake of opioids by one tablet each day Next start to increase the time between doses. The last dose that should be eliminated is the evening dose.          Allergies as of 04/25/2022       Reactions   Atorvastatin Calcium    muscle cramps        Medication List     TAKE these medications    ibuprofen 200 MG tablet Commonly known as: ADVIL Take 200 mg by mouth every 6 (six) hours as needed for moderate pain.   lactulose 10 GM/15ML solution Commonly known as: CHRONULAC Take 45 mLs (30 g total) by mouth 3 (three) times daily. What changed: when to take this   mirabegron ER 25 MG Tb24 tablet Commonly known as: MYRBETRIQ Take 1 tablet (25 mg total) by mouth daily.   sodium chloride 0.65 % Soln nasal spray Commonly known as: OCEAN Place 1 spray into both nostrils as needed for congestion.   spironolactone 50 MG tablet Commonly known as: ALDACTONE Take 1 tablet (50 mg total) by mouth daily.   traMADol 50 MG tablet Commonly known as: Ultram Take 1 tablet (50 mg total) by mouth every 6 (six) hours as needed for severe pain or moderate pain.   VITAMIN C PO Take 2,000 mg by mouth daily.          Signed: Ventura Bruns 05/06/2022, 7:38 AM  [facility] is now [facility] [address] [place], [place], [place] [address] Phone: [place]

## 2022-05-07 DIAGNOSIS — Z4789 Encounter for other orthopedic aftercare: Secondary | ICD-10-CM | POA: Diagnosis not present

## 2022-05-08 DIAGNOSIS — N39498 Other specified urinary incontinence: Secondary | ICD-10-CM | POA: Diagnosis not present

## 2022-05-08 DIAGNOSIS — Z9181 History of falling: Secondary | ICD-10-CM | POA: Diagnosis not present

## 2022-05-08 DIAGNOSIS — M9905 Segmental and somatic dysfunction of pelvic region: Secondary | ICD-10-CM | POA: Diagnosis not present

## 2022-05-08 DIAGNOSIS — R2681 Unsteadiness on feet: Secondary | ICD-10-CM | POA: Diagnosis not present

## 2022-05-08 DIAGNOSIS — R2689 Other abnormalities of gait and mobility: Secondary | ICD-10-CM | POA: Diagnosis not present

## 2022-05-08 DIAGNOSIS — M6281 Muscle weakness (generalized): Secondary | ICD-10-CM | POA: Diagnosis not present

## 2022-05-08 DIAGNOSIS — Z471 Aftercare following joint replacement surgery: Secondary | ICD-10-CM | POA: Diagnosis not present

## 2022-05-13 DIAGNOSIS — M9905 Segmental and somatic dysfunction of pelvic region: Secondary | ICD-10-CM | POA: Diagnosis not present

## 2022-05-13 DIAGNOSIS — M6281 Muscle weakness (generalized): Secondary | ICD-10-CM | POA: Diagnosis not present

## 2022-05-13 DIAGNOSIS — Z471 Aftercare following joint replacement surgery: Secondary | ICD-10-CM | POA: Diagnosis not present

## 2022-05-13 DIAGNOSIS — R2681 Unsteadiness on feet: Secondary | ICD-10-CM | POA: Diagnosis not present

## 2022-05-13 DIAGNOSIS — R2689 Other abnormalities of gait and mobility: Secondary | ICD-10-CM | POA: Diagnosis not present

## 2022-05-13 DIAGNOSIS — Z9181 History of falling: Secondary | ICD-10-CM | POA: Diagnosis not present

## 2022-05-18 DIAGNOSIS — S51012A Laceration without foreign body of left elbow, initial encounter: Secondary | ICD-10-CM | POA: Diagnosis not present

## 2022-05-18 DIAGNOSIS — S4992XA Unspecified injury of left shoulder and upper arm, initial encounter: Secondary | ICD-10-CM | POA: Diagnosis not present

## 2022-05-18 DIAGNOSIS — M545 Low back pain, unspecified: Secondary | ICD-10-CM | POA: Diagnosis not present

## 2022-05-18 DIAGNOSIS — R55 Syncope and collapse: Secondary | ICD-10-CM | POA: Diagnosis not present

## 2022-05-18 DIAGNOSIS — S91012A Laceration without foreign body, left ankle, initial encounter: Secondary | ICD-10-CM | POA: Diagnosis not present

## 2022-05-18 DIAGNOSIS — G8911 Acute pain due to trauma: Secondary | ICD-10-CM | POA: Diagnosis not present

## 2022-05-18 DIAGNOSIS — M25519 Pain in unspecified shoulder: Secondary | ICD-10-CM | POA: Diagnosis not present

## 2022-05-18 DIAGNOSIS — W108XXA Fall (on) (from) other stairs and steps, initial encounter: Secondary | ICD-10-CM | POA: Diagnosis not present

## 2022-05-18 DIAGNOSIS — S0990XA Unspecified injury of head, initial encounter: Secondary | ICD-10-CM | POA: Diagnosis not present

## 2022-05-18 DIAGNOSIS — M25512 Pain in left shoulder: Secondary | ICD-10-CM | POA: Diagnosis not present

## 2022-05-19 DIAGNOSIS — G8911 Acute pain due to trauma: Secondary | ICD-10-CM | POA: Diagnosis not present

## 2022-05-19 DIAGNOSIS — M25512 Pain in left shoulder: Secondary | ICD-10-CM | POA: Diagnosis not present

## 2022-05-19 DIAGNOSIS — S4992XA Unspecified injury of left shoulder and upper arm, initial encounter: Secondary | ICD-10-CM | POA: Diagnosis not present

## 2022-05-19 DIAGNOSIS — S51012A Laceration without foreign body of left elbow, initial encounter: Secondary | ICD-10-CM | POA: Diagnosis not present

## 2022-05-19 DIAGNOSIS — S0990XA Unspecified injury of head, initial encounter: Secondary | ICD-10-CM | POA: Diagnosis not present

## 2022-05-19 DIAGNOSIS — S91012A Laceration without foreign body, left ankle, initial encounter: Secondary | ICD-10-CM | POA: Diagnosis not present

## 2022-05-19 DIAGNOSIS — R55 Syncope and collapse: Secondary | ICD-10-CM | POA: Diagnosis not present

## 2022-05-22 DIAGNOSIS — M5451 Vertebrogenic low back pain: Secondary | ICD-10-CM | POA: Diagnosis not present

## 2022-05-23 DIAGNOSIS — M545 Low back pain, unspecified: Secondary | ICD-10-CM | POA: Diagnosis not present

## 2022-05-23 DIAGNOSIS — M5451 Vertebrogenic low back pain: Secondary | ICD-10-CM | POA: Diagnosis not present

## 2022-05-25 DIAGNOSIS — R2689 Other abnormalities of gait and mobility: Secondary | ICD-10-CM | POA: Diagnosis not present

## 2022-05-25 DIAGNOSIS — Z471 Aftercare following joint replacement surgery: Secondary | ICD-10-CM | POA: Diagnosis not present

## 2022-05-25 DIAGNOSIS — Z9181 History of falling: Secondary | ICD-10-CM | POA: Diagnosis not present

## 2022-05-25 DIAGNOSIS — R2681 Unsteadiness on feet: Secondary | ICD-10-CM | POA: Diagnosis not present

## 2022-05-25 DIAGNOSIS — M9905 Segmental and somatic dysfunction of pelvic region: Secondary | ICD-10-CM | POA: Diagnosis not present

## 2022-05-25 DIAGNOSIS — M6281 Muscle weakness (generalized): Secondary | ICD-10-CM | POA: Diagnosis not present

## 2022-05-26 ENCOUNTER — Ambulatory Visit: Payer: Medicare Other | Admitting: Obstetrics and Gynecology

## 2022-05-26 DIAGNOSIS — M5451 Vertebrogenic low back pain: Secondary | ICD-10-CM | POA: Diagnosis not present

## 2022-05-27 DIAGNOSIS — M9905 Segmental and somatic dysfunction of pelvic region: Secondary | ICD-10-CM | POA: Diagnosis not present

## 2022-05-27 DIAGNOSIS — R2681 Unsteadiness on feet: Secondary | ICD-10-CM | POA: Diagnosis not present

## 2022-05-27 DIAGNOSIS — R2689 Other abnormalities of gait and mobility: Secondary | ICD-10-CM | POA: Diagnosis not present

## 2022-05-27 DIAGNOSIS — Z471 Aftercare following joint replacement surgery: Secondary | ICD-10-CM | POA: Diagnosis not present

## 2022-05-27 DIAGNOSIS — Z9181 History of falling: Secondary | ICD-10-CM | POA: Diagnosis not present

## 2022-05-27 DIAGNOSIS — M6281 Muscle weakness (generalized): Secondary | ICD-10-CM | POA: Diagnosis not present

## 2022-05-29 DIAGNOSIS — M6281 Muscle weakness (generalized): Secondary | ICD-10-CM | POA: Diagnosis not present

## 2022-05-29 DIAGNOSIS — Z471 Aftercare following joint replacement surgery: Secondary | ICD-10-CM | POA: Diagnosis not present

## 2022-05-29 DIAGNOSIS — Z9181 History of falling: Secondary | ICD-10-CM | POA: Diagnosis not present

## 2022-05-29 DIAGNOSIS — M9905 Segmental and somatic dysfunction of pelvic region: Secondary | ICD-10-CM | POA: Diagnosis not present

## 2022-05-29 DIAGNOSIS — R2681 Unsteadiness on feet: Secondary | ICD-10-CM | POA: Diagnosis not present

## 2022-05-29 DIAGNOSIS — R2689 Other abnormalities of gait and mobility: Secondary | ICD-10-CM | POA: Diagnosis not present

## 2022-06-03 DIAGNOSIS — Z9181 History of falling: Secondary | ICD-10-CM | POA: Diagnosis not present

## 2022-06-03 DIAGNOSIS — Z471 Aftercare following joint replacement surgery: Secondary | ICD-10-CM | POA: Diagnosis not present

## 2022-06-03 DIAGNOSIS — R2689 Other abnormalities of gait and mobility: Secondary | ICD-10-CM | POA: Diagnosis not present

## 2022-06-03 DIAGNOSIS — M6281 Muscle weakness (generalized): Secondary | ICD-10-CM | POA: Diagnosis not present

## 2022-06-03 DIAGNOSIS — M9905 Segmental and somatic dysfunction of pelvic region: Secondary | ICD-10-CM | POA: Diagnosis not present

## 2022-06-03 DIAGNOSIS — R2681 Unsteadiness on feet: Secondary | ICD-10-CM | POA: Diagnosis not present

## 2022-06-03 DIAGNOSIS — Z4789 Encounter for other orthopedic aftercare: Secondary | ICD-10-CM | POA: Diagnosis not present

## 2022-06-05 DIAGNOSIS — R2681 Unsteadiness on feet: Secondary | ICD-10-CM | POA: Diagnosis not present

## 2022-06-05 DIAGNOSIS — R2689 Other abnormalities of gait and mobility: Secondary | ICD-10-CM | POA: Diagnosis not present

## 2022-06-05 DIAGNOSIS — M6281 Muscle weakness (generalized): Secondary | ICD-10-CM | POA: Diagnosis not present

## 2022-06-05 DIAGNOSIS — Z9181 History of falling: Secondary | ICD-10-CM | POA: Diagnosis not present

## 2022-06-05 DIAGNOSIS — Z471 Aftercare following joint replacement surgery: Secondary | ICD-10-CM | POA: Diagnosis not present

## 2022-06-05 DIAGNOSIS — M9905 Segmental and somatic dysfunction of pelvic region: Secondary | ICD-10-CM | POA: Diagnosis not present

## 2022-06-10 DIAGNOSIS — N39498 Other specified urinary incontinence: Secondary | ICD-10-CM | POA: Diagnosis not present

## 2022-06-10 DIAGNOSIS — M6281 Muscle weakness (generalized): Secondary | ICD-10-CM | POA: Diagnosis not present

## 2022-06-10 DIAGNOSIS — Z9181 History of falling: Secondary | ICD-10-CM | POA: Diagnosis not present

## 2022-06-10 DIAGNOSIS — Z471 Aftercare following joint replacement surgery: Secondary | ICD-10-CM | POA: Diagnosis not present

## 2022-06-10 DIAGNOSIS — R2689 Other abnormalities of gait and mobility: Secondary | ICD-10-CM | POA: Diagnosis not present

## 2022-06-10 DIAGNOSIS — M9905 Segmental and somatic dysfunction of pelvic region: Secondary | ICD-10-CM | POA: Diagnosis not present

## 2022-06-10 DIAGNOSIS — R2681 Unsteadiness on feet: Secondary | ICD-10-CM | POA: Diagnosis not present

## 2022-06-12 DIAGNOSIS — R2689 Other abnormalities of gait and mobility: Secondary | ICD-10-CM | POA: Diagnosis not present

## 2022-06-12 DIAGNOSIS — Z471 Aftercare following joint replacement surgery: Secondary | ICD-10-CM | POA: Diagnosis not present

## 2022-06-12 DIAGNOSIS — M9905 Segmental and somatic dysfunction of pelvic region: Secondary | ICD-10-CM | POA: Diagnosis not present

## 2022-06-12 DIAGNOSIS — Z9181 History of falling: Secondary | ICD-10-CM | POA: Diagnosis not present

## 2022-06-12 DIAGNOSIS — M6281 Muscle weakness (generalized): Secondary | ICD-10-CM | POA: Diagnosis not present

## 2022-06-12 DIAGNOSIS — R2681 Unsteadiness on feet: Secondary | ICD-10-CM | POA: Diagnosis not present

## 2022-06-17 DIAGNOSIS — R2689 Other abnormalities of gait and mobility: Secondary | ICD-10-CM | POA: Diagnosis not present

## 2022-06-17 DIAGNOSIS — M6281 Muscle weakness (generalized): Secondary | ICD-10-CM | POA: Diagnosis not present

## 2022-06-17 DIAGNOSIS — Z9181 History of falling: Secondary | ICD-10-CM | POA: Diagnosis not present

## 2022-06-17 DIAGNOSIS — M9905 Segmental and somatic dysfunction of pelvic region: Secondary | ICD-10-CM | POA: Diagnosis not present

## 2022-06-17 DIAGNOSIS — Z471 Aftercare following joint replacement surgery: Secondary | ICD-10-CM | POA: Diagnosis not present

## 2022-06-17 DIAGNOSIS — R2681 Unsteadiness on feet: Secondary | ICD-10-CM | POA: Diagnosis not present

## 2022-06-19 DIAGNOSIS — R2689 Other abnormalities of gait and mobility: Secondary | ICD-10-CM | POA: Diagnosis not present

## 2022-06-19 DIAGNOSIS — Z471 Aftercare following joint replacement surgery: Secondary | ICD-10-CM | POA: Diagnosis not present

## 2022-06-19 DIAGNOSIS — Z9181 History of falling: Secondary | ICD-10-CM | POA: Diagnosis not present

## 2022-06-19 DIAGNOSIS — M9905 Segmental and somatic dysfunction of pelvic region: Secondary | ICD-10-CM | POA: Diagnosis not present

## 2022-06-19 DIAGNOSIS — M6281 Muscle weakness (generalized): Secondary | ICD-10-CM | POA: Diagnosis not present

## 2022-06-19 DIAGNOSIS — R2681 Unsteadiness on feet: Secondary | ICD-10-CM | POA: Diagnosis not present

## 2022-06-24 DIAGNOSIS — Z9181 History of falling: Secondary | ICD-10-CM | POA: Diagnosis not present

## 2022-06-24 DIAGNOSIS — M6281 Muscle weakness (generalized): Secondary | ICD-10-CM | POA: Diagnosis not present

## 2022-06-24 DIAGNOSIS — R2681 Unsteadiness on feet: Secondary | ICD-10-CM | POA: Diagnosis not present

## 2022-06-24 DIAGNOSIS — M9905 Segmental and somatic dysfunction of pelvic region: Secondary | ICD-10-CM | POA: Diagnosis not present

## 2022-06-24 DIAGNOSIS — R2689 Other abnormalities of gait and mobility: Secondary | ICD-10-CM | POA: Diagnosis not present

## 2022-06-24 DIAGNOSIS — M5451 Vertebrogenic low back pain: Secondary | ICD-10-CM | POA: Diagnosis not present

## 2022-06-24 DIAGNOSIS — Z471 Aftercare following joint replacement surgery: Secondary | ICD-10-CM | POA: Diagnosis not present

## 2022-06-26 DIAGNOSIS — M9905 Segmental and somatic dysfunction of pelvic region: Secondary | ICD-10-CM | POA: Diagnosis not present

## 2022-06-26 DIAGNOSIS — M6281 Muscle weakness (generalized): Secondary | ICD-10-CM | POA: Diagnosis not present

## 2022-06-26 DIAGNOSIS — R2681 Unsteadiness on feet: Secondary | ICD-10-CM | POA: Diagnosis not present

## 2022-06-26 DIAGNOSIS — Z9181 History of falling: Secondary | ICD-10-CM | POA: Diagnosis not present

## 2022-06-26 DIAGNOSIS — Z471 Aftercare following joint replacement surgery: Secondary | ICD-10-CM | POA: Diagnosis not present

## 2022-06-26 DIAGNOSIS — R2689 Other abnormalities of gait and mobility: Secondary | ICD-10-CM | POA: Diagnosis not present

## 2022-07-01 DIAGNOSIS — Z9181 History of falling: Secondary | ICD-10-CM | POA: Diagnosis not present

## 2022-07-01 DIAGNOSIS — M6281 Muscle weakness (generalized): Secondary | ICD-10-CM | POA: Diagnosis not present

## 2022-07-01 DIAGNOSIS — Z471 Aftercare following joint replacement surgery: Secondary | ICD-10-CM | POA: Diagnosis not present

## 2022-07-01 DIAGNOSIS — R2681 Unsteadiness on feet: Secondary | ICD-10-CM | POA: Diagnosis not present

## 2022-07-01 DIAGNOSIS — M9905 Segmental and somatic dysfunction of pelvic region: Secondary | ICD-10-CM | POA: Diagnosis not present

## 2022-07-01 DIAGNOSIS — R2689 Other abnormalities of gait and mobility: Secondary | ICD-10-CM | POA: Diagnosis not present

## 2022-07-02 DIAGNOSIS — Z4789 Encounter for other orthopedic aftercare: Secondary | ICD-10-CM | POA: Diagnosis not present

## 2022-07-08 DIAGNOSIS — M6281 Muscle weakness (generalized): Secondary | ICD-10-CM | POA: Diagnosis not present

## 2022-07-08 DIAGNOSIS — R2689 Other abnormalities of gait and mobility: Secondary | ICD-10-CM | POA: Diagnosis not present

## 2022-07-08 DIAGNOSIS — R2681 Unsteadiness on feet: Secondary | ICD-10-CM | POA: Diagnosis not present

## 2022-07-08 DIAGNOSIS — N39498 Other specified urinary incontinence: Secondary | ICD-10-CM | POA: Diagnosis not present

## 2022-07-08 DIAGNOSIS — M9905 Segmental and somatic dysfunction of pelvic region: Secondary | ICD-10-CM | POA: Diagnosis not present

## 2022-07-08 DIAGNOSIS — Z9181 History of falling: Secondary | ICD-10-CM | POA: Diagnosis not present

## 2022-07-08 DIAGNOSIS — Z471 Aftercare following joint replacement surgery: Secondary | ICD-10-CM | POA: Diagnosis not present

## 2022-07-10 DIAGNOSIS — M8008XA Age-related osteoporosis with current pathological fracture, vertebra(e), initial encounter for fracture: Secondary | ICD-10-CM | POA: Diagnosis not present

## 2022-07-15 DIAGNOSIS — M6281 Muscle weakness (generalized): Secondary | ICD-10-CM | POA: Diagnosis not present

## 2022-07-15 DIAGNOSIS — Z471 Aftercare following joint replacement surgery: Secondary | ICD-10-CM | POA: Diagnosis not present

## 2022-07-15 DIAGNOSIS — Z9181 History of falling: Secondary | ICD-10-CM | POA: Diagnosis not present

## 2022-07-15 DIAGNOSIS — M9905 Segmental and somatic dysfunction of pelvic region: Secondary | ICD-10-CM | POA: Diagnosis not present

## 2022-07-15 DIAGNOSIS — R2689 Other abnormalities of gait and mobility: Secondary | ICD-10-CM | POA: Diagnosis not present

## 2022-07-15 DIAGNOSIS — R2681 Unsteadiness on feet: Secondary | ICD-10-CM | POA: Diagnosis not present

## 2022-07-17 DIAGNOSIS — M9905 Segmental and somatic dysfunction of pelvic region: Secondary | ICD-10-CM | POA: Diagnosis not present

## 2022-07-17 DIAGNOSIS — M6281 Muscle weakness (generalized): Secondary | ICD-10-CM | POA: Diagnosis not present

## 2022-07-17 DIAGNOSIS — R2681 Unsteadiness on feet: Secondary | ICD-10-CM | POA: Diagnosis not present

## 2022-07-17 DIAGNOSIS — Z471 Aftercare following joint replacement surgery: Secondary | ICD-10-CM | POA: Diagnosis not present

## 2022-07-17 DIAGNOSIS — Z9181 History of falling: Secondary | ICD-10-CM | POA: Diagnosis not present

## 2022-07-17 DIAGNOSIS — R2689 Other abnormalities of gait and mobility: Secondary | ICD-10-CM | POA: Diagnosis not present

## 2022-07-22 DIAGNOSIS — M6281 Muscle weakness (generalized): Secondary | ICD-10-CM | POA: Diagnosis not present

## 2022-07-22 DIAGNOSIS — M9905 Segmental and somatic dysfunction of pelvic region: Secondary | ICD-10-CM | POA: Diagnosis not present

## 2022-07-22 DIAGNOSIS — R2681 Unsteadiness on feet: Secondary | ICD-10-CM | POA: Diagnosis not present

## 2022-07-22 DIAGNOSIS — Z471 Aftercare following joint replacement surgery: Secondary | ICD-10-CM | POA: Diagnosis not present

## 2022-07-22 DIAGNOSIS — R2689 Other abnormalities of gait and mobility: Secondary | ICD-10-CM | POA: Diagnosis not present

## 2022-07-22 DIAGNOSIS — Z9181 History of falling: Secondary | ICD-10-CM | POA: Diagnosis not present

## 2022-07-24 DIAGNOSIS — Z9181 History of falling: Secondary | ICD-10-CM | POA: Diagnosis not present

## 2022-07-24 DIAGNOSIS — M6281 Muscle weakness (generalized): Secondary | ICD-10-CM | POA: Diagnosis not present

## 2022-07-24 DIAGNOSIS — R2681 Unsteadiness on feet: Secondary | ICD-10-CM | POA: Diagnosis not present

## 2022-07-24 DIAGNOSIS — Z471 Aftercare following joint replacement surgery: Secondary | ICD-10-CM | POA: Diagnosis not present

## 2022-07-24 DIAGNOSIS — M9905 Segmental and somatic dysfunction of pelvic region: Secondary | ICD-10-CM | POA: Diagnosis not present

## 2022-07-24 DIAGNOSIS — R2689 Other abnormalities of gait and mobility: Secondary | ICD-10-CM | POA: Diagnosis not present

## 2022-07-29 DIAGNOSIS — M9905 Segmental and somatic dysfunction of pelvic region: Secondary | ICD-10-CM | POA: Diagnosis not present

## 2022-07-29 DIAGNOSIS — R2681 Unsteadiness on feet: Secondary | ICD-10-CM | POA: Diagnosis not present

## 2022-07-29 DIAGNOSIS — M6281 Muscle weakness (generalized): Secondary | ICD-10-CM | POA: Diagnosis not present

## 2022-07-29 DIAGNOSIS — Z9181 History of falling: Secondary | ICD-10-CM | POA: Diagnosis not present

## 2022-07-29 DIAGNOSIS — Z471 Aftercare following joint replacement surgery: Secondary | ICD-10-CM | POA: Diagnosis not present

## 2022-07-29 DIAGNOSIS — R2689 Other abnormalities of gait and mobility: Secondary | ICD-10-CM | POA: Diagnosis not present

## 2022-07-31 DIAGNOSIS — Z9181 History of falling: Secondary | ICD-10-CM | POA: Diagnosis not present

## 2022-07-31 DIAGNOSIS — Z471 Aftercare following joint replacement surgery: Secondary | ICD-10-CM | POA: Diagnosis not present

## 2022-07-31 DIAGNOSIS — M6281 Muscle weakness (generalized): Secondary | ICD-10-CM | POA: Diagnosis not present

## 2022-07-31 DIAGNOSIS — R2689 Other abnormalities of gait and mobility: Secondary | ICD-10-CM | POA: Diagnosis not present

## 2022-07-31 DIAGNOSIS — M9905 Segmental and somatic dysfunction of pelvic region: Secondary | ICD-10-CM | POA: Diagnosis not present

## 2022-07-31 DIAGNOSIS — R2681 Unsteadiness on feet: Secondary | ICD-10-CM | POA: Diagnosis not present

## 2022-08-05 DIAGNOSIS — M9905 Segmental and somatic dysfunction of pelvic region: Secondary | ICD-10-CM | POA: Diagnosis not present

## 2022-08-05 DIAGNOSIS — R2689 Other abnormalities of gait and mobility: Secondary | ICD-10-CM | POA: Diagnosis not present

## 2022-08-05 DIAGNOSIS — Z9181 History of falling: Secondary | ICD-10-CM | POA: Diagnosis not present

## 2022-08-05 DIAGNOSIS — M6281 Muscle weakness (generalized): Secondary | ICD-10-CM | POA: Diagnosis not present

## 2022-08-05 DIAGNOSIS — R2681 Unsteadiness on feet: Secondary | ICD-10-CM | POA: Diagnosis not present

## 2022-08-05 DIAGNOSIS — Z471 Aftercare following joint replacement surgery: Secondary | ICD-10-CM | POA: Diagnosis not present

## 2022-08-07 DIAGNOSIS — R2689 Other abnormalities of gait and mobility: Secondary | ICD-10-CM | POA: Diagnosis not present

## 2022-08-07 DIAGNOSIS — R2681 Unsteadiness on feet: Secondary | ICD-10-CM | POA: Diagnosis not present

## 2022-08-07 DIAGNOSIS — Z9181 History of falling: Secondary | ICD-10-CM | POA: Diagnosis not present

## 2022-08-07 DIAGNOSIS — M6281 Muscle weakness (generalized): Secondary | ICD-10-CM | POA: Diagnosis not present

## 2022-08-07 DIAGNOSIS — Z471 Aftercare following joint replacement surgery: Secondary | ICD-10-CM | POA: Diagnosis not present

## 2022-08-07 DIAGNOSIS — N39498 Other specified urinary incontinence: Secondary | ICD-10-CM | POA: Diagnosis not present

## 2022-08-07 DIAGNOSIS — M9905 Segmental and somatic dysfunction of pelvic region: Secondary | ICD-10-CM | POA: Diagnosis not present

## 2022-08-12 DIAGNOSIS — M6281 Muscle weakness (generalized): Secondary | ICD-10-CM | POA: Diagnosis not present

## 2022-08-12 DIAGNOSIS — Z471 Aftercare following joint replacement surgery: Secondary | ICD-10-CM | POA: Diagnosis not present

## 2022-08-12 DIAGNOSIS — R2681 Unsteadiness on feet: Secondary | ICD-10-CM | POA: Diagnosis not present

## 2022-08-12 DIAGNOSIS — Z9181 History of falling: Secondary | ICD-10-CM | POA: Diagnosis not present

## 2022-08-12 DIAGNOSIS — R2689 Other abnormalities of gait and mobility: Secondary | ICD-10-CM | POA: Diagnosis not present

## 2022-08-12 DIAGNOSIS — M9905 Segmental and somatic dysfunction of pelvic region: Secondary | ICD-10-CM | POA: Diagnosis not present

## 2022-08-13 DIAGNOSIS — Z4789 Encounter for other orthopedic aftercare: Secondary | ICD-10-CM | POA: Diagnosis not present

## 2022-08-14 DIAGNOSIS — M9905 Segmental and somatic dysfunction of pelvic region: Secondary | ICD-10-CM | POA: Diagnosis not present

## 2022-08-14 DIAGNOSIS — Z471 Aftercare following joint replacement surgery: Secondary | ICD-10-CM | POA: Diagnosis not present

## 2022-08-14 DIAGNOSIS — R2681 Unsteadiness on feet: Secondary | ICD-10-CM | POA: Diagnosis not present

## 2022-08-14 DIAGNOSIS — M6281 Muscle weakness (generalized): Secondary | ICD-10-CM | POA: Diagnosis not present

## 2022-08-14 DIAGNOSIS — R2689 Other abnormalities of gait and mobility: Secondary | ICD-10-CM | POA: Diagnosis not present

## 2022-08-14 DIAGNOSIS — Z9181 History of falling: Secondary | ICD-10-CM | POA: Diagnosis not present

## 2022-08-19 DIAGNOSIS — R2689 Other abnormalities of gait and mobility: Secondary | ICD-10-CM | POA: Diagnosis not present

## 2022-08-19 DIAGNOSIS — M6281 Muscle weakness (generalized): Secondary | ICD-10-CM | POA: Diagnosis not present

## 2022-08-19 DIAGNOSIS — Z9181 History of falling: Secondary | ICD-10-CM | POA: Diagnosis not present

## 2022-08-19 DIAGNOSIS — Z471 Aftercare following joint replacement surgery: Secondary | ICD-10-CM | POA: Diagnosis not present

## 2022-08-19 DIAGNOSIS — M9905 Segmental and somatic dysfunction of pelvic region: Secondary | ICD-10-CM | POA: Diagnosis not present

## 2022-08-19 DIAGNOSIS — R2681 Unsteadiness on feet: Secondary | ICD-10-CM | POA: Diagnosis not present

## 2022-08-21 DIAGNOSIS — Z9181 History of falling: Secondary | ICD-10-CM | POA: Diagnosis not present

## 2022-08-21 DIAGNOSIS — M9905 Segmental and somatic dysfunction of pelvic region: Secondary | ICD-10-CM | POA: Diagnosis not present

## 2022-08-21 DIAGNOSIS — Z471 Aftercare following joint replacement surgery: Secondary | ICD-10-CM | POA: Diagnosis not present

## 2022-08-21 DIAGNOSIS — R2689 Other abnormalities of gait and mobility: Secondary | ICD-10-CM | POA: Diagnosis not present

## 2022-08-21 DIAGNOSIS — M6281 Muscle weakness (generalized): Secondary | ICD-10-CM | POA: Diagnosis not present

## 2022-08-21 DIAGNOSIS — R2681 Unsteadiness on feet: Secondary | ICD-10-CM | POA: Diagnosis not present

## 2022-08-26 DIAGNOSIS — M9905 Segmental and somatic dysfunction of pelvic region: Secondary | ICD-10-CM | POA: Diagnosis not present

## 2022-08-26 DIAGNOSIS — Z9181 History of falling: Secondary | ICD-10-CM | POA: Diagnosis not present

## 2022-08-26 DIAGNOSIS — R2689 Other abnormalities of gait and mobility: Secondary | ICD-10-CM | POA: Diagnosis not present

## 2022-08-26 DIAGNOSIS — M6281 Muscle weakness (generalized): Secondary | ICD-10-CM | POA: Diagnosis not present

## 2022-08-26 DIAGNOSIS — R2681 Unsteadiness on feet: Secondary | ICD-10-CM | POA: Diagnosis not present

## 2022-08-26 DIAGNOSIS — Z471 Aftercare following joint replacement surgery: Secondary | ICD-10-CM | POA: Diagnosis not present

## 2022-08-28 DIAGNOSIS — R2681 Unsteadiness on feet: Secondary | ICD-10-CM | POA: Diagnosis not present

## 2022-08-28 DIAGNOSIS — Z9181 History of falling: Secondary | ICD-10-CM | POA: Diagnosis not present

## 2022-08-28 DIAGNOSIS — M9905 Segmental and somatic dysfunction of pelvic region: Secondary | ICD-10-CM | POA: Diagnosis not present

## 2022-08-28 DIAGNOSIS — Z471 Aftercare following joint replacement surgery: Secondary | ICD-10-CM | POA: Diagnosis not present

## 2022-08-28 DIAGNOSIS — M6281 Muscle weakness (generalized): Secondary | ICD-10-CM | POA: Diagnosis not present

## 2022-08-28 DIAGNOSIS — R2689 Other abnormalities of gait and mobility: Secondary | ICD-10-CM | POA: Diagnosis not present

## 2022-09-02 DIAGNOSIS — D709 Neutropenia, unspecified: Secondary | ICD-10-CM | POA: Diagnosis not present

## 2022-09-02 DIAGNOSIS — Z4889 Encounter for other specified surgical aftercare: Secondary | ICD-10-CM | POA: Diagnosis not present

## 2022-09-02 DIAGNOSIS — K7030 Alcoholic cirrhosis of liver without ascites: Secondary | ICD-10-CM | POA: Diagnosis not present

## 2022-09-02 DIAGNOSIS — D696 Thrombocytopenia, unspecified: Secondary | ICD-10-CM | POA: Diagnosis not present

## 2022-09-02 DIAGNOSIS — R7303 Prediabetes: Secondary | ICD-10-CM | POA: Diagnosis not present

## 2022-09-04 DIAGNOSIS — R2689 Other abnormalities of gait and mobility: Secondary | ICD-10-CM | POA: Diagnosis not present

## 2022-09-04 DIAGNOSIS — M6281 Muscle weakness (generalized): Secondary | ICD-10-CM | POA: Diagnosis not present

## 2022-09-04 DIAGNOSIS — M9905 Segmental and somatic dysfunction of pelvic region: Secondary | ICD-10-CM | POA: Diagnosis not present

## 2022-09-04 DIAGNOSIS — R2681 Unsteadiness on feet: Secondary | ICD-10-CM | POA: Diagnosis not present

## 2022-09-04 DIAGNOSIS — Z471 Aftercare following joint replacement surgery: Secondary | ICD-10-CM | POA: Diagnosis not present

## 2022-09-04 DIAGNOSIS — Z9181 History of falling: Secondary | ICD-10-CM | POA: Diagnosis not present

## 2022-09-09 DIAGNOSIS — E785 Hyperlipidemia, unspecified: Secondary | ICD-10-CM | POA: Diagnosis not present

## 2022-09-09 DIAGNOSIS — D709 Neutropenia, unspecified: Secondary | ICD-10-CM | POA: Diagnosis not present

## 2022-09-09 DIAGNOSIS — I10 Essential (primary) hypertension: Secondary | ICD-10-CM | POA: Diagnosis not present

## 2022-09-09 DIAGNOSIS — I700 Atherosclerosis of aorta: Secondary | ICD-10-CM | POA: Diagnosis not present

## 2022-09-09 DIAGNOSIS — Z23 Encounter for immunization: Secondary | ICD-10-CM | POA: Diagnosis not present

## 2022-09-09 DIAGNOSIS — D696 Thrombocytopenia, unspecified: Secondary | ICD-10-CM | POA: Diagnosis not present

## 2022-09-09 DIAGNOSIS — R32 Unspecified urinary incontinence: Secondary | ICD-10-CM | POA: Diagnosis not present

## 2022-09-09 DIAGNOSIS — K219 Gastro-esophageal reflux disease without esophagitis: Secondary | ICD-10-CM | POA: Diagnosis not present

## 2022-09-09 DIAGNOSIS — E559 Vitamin D deficiency, unspecified: Secondary | ICD-10-CM | POA: Diagnosis not present

## 2022-09-09 DIAGNOSIS — R2689 Other abnormalities of gait and mobility: Secondary | ICD-10-CM | POA: Diagnosis not present

## 2022-09-09 DIAGNOSIS — K7030 Alcoholic cirrhosis of liver without ascites: Secondary | ICD-10-CM | POA: Diagnosis not present

## 2022-09-11 DIAGNOSIS — Z9181 History of falling: Secondary | ICD-10-CM | POA: Diagnosis not present

## 2022-09-11 DIAGNOSIS — N39498 Other specified urinary incontinence: Secondary | ICD-10-CM | POA: Diagnosis not present

## 2022-09-11 DIAGNOSIS — R2681 Unsteadiness on feet: Secondary | ICD-10-CM | POA: Diagnosis not present

## 2022-09-11 DIAGNOSIS — Z471 Aftercare following joint replacement surgery: Secondary | ICD-10-CM | POA: Diagnosis not present

## 2022-09-11 DIAGNOSIS — M9905 Segmental and somatic dysfunction of pelvic region: Secondary | ICD-10-CM | POA: Diagnosis not present

## 2022-09-11 DIAGNOSIS — M6281 Muscle weakness (generalized): Secondary | ICD-10-CM | POA: Diagnosis not present

## 2022-09-11 DIAGNOSIS — R2689 Other abnormalities of gait and mobility: Secondary | ICD-10-CM | POA: Diagnosis not present

## 2022-09-14 DIAGNOSIS — Z9181 History of falling: Secondary | ICD-10-CM | POA: Diagnosis not present

## 2022-09-14 DIAGNOSIS — M9905 Segmental and somatic dysfunction of pelvic region: Secondary | ICD-10-CM | POA: Diagnosis not present

## 2022-09-14 DIAGNOSIS — R2681 Unsteadiness on feet: Secondary | ICD-10-CM | POA: Diagnosis not present

## 2022-09-14 DIAGNOSIS — Z471 Aftercare following joint replacement surgery: Secondary | ICD-10-CM | POA: Diagnosis not present

## 2022-09-14 DIAGNOSIS — M6281 Muscle weakness (generalized): Secondary | ICD-10-CM | POA: Diagnosis not present

## 2022-09-14 DIAGNOSIS — R2689 Other abnormalities of gait and mobility: Secondary | ICD-10-CM | POA: Diagnosis not present

## 2022-09-18 DIAGNOSIS — M9905 Segmental and somatic dysfunction of pelvic region: Secondary | ICD-10-CM | POA: Diagnosis not present

## 2022-09-18 DIAGNOSIS — M6281 Muscle weakness (generalized): Secondary | ICD-10-CM | POA: Diagnosis not present

## 2022-09-18 DIAGNOSIS — Z471 Aftercare following joint replacement surgery: Secondary | ICD-10-CM | POA: Diagnosis not present

## 2022-09-18 DIAGNOSIS — R2689 Other abnormalities of gait and mobility: Secondary | ICD-10-CM | POA: Diagnosis not present

## 2022-09-18 DIAGNOSIS — Z9181 History of falling: Secondary | ICD-10-CM | POA: Diagnosis not present

## 2022-09-18 DIAGNOSIS — R2681 Unsteadiness on feet: Secondary | ICD-10-CM | POA: Diagnosis not present

## 2022-09-23 DIAGNOSIS — R2681 Unsteadiness on feet: Secondary | ICD-10-CM | POA: Diagnosis not present

## 2022-09-23 DIAGNOSIS — R2689 Other abnormalities of gait and mobility: Secondary | ICD-10-CM | POA: Diagnosis not present

## 2022-09-23 DIAGNOSIS — Z9181 History of falling: Secondary | ICD-10-CM | POA: Diagnosis not present

## 2022-09-23 DIAGNOSIS — M6281 Muscle weakness (generalized): Secondary | ICD-10-CM | POA: Diagnosis not present

## 2022-09-23 DIAGNOSIS — M9905 Segmental and somatic dysfunction of pelvic region: Secondary | ICD-10-CM | POA: Diagnosis not present

## 2022-09-23 DIAGNOSIS — Z471 Aftercare following joint replacement surgery: Secondary | ICD-10-CM | POA: Diagnosis not present

## 2022-09-25 DIAGNOSIS — Z9181 History of falling: Secondary | ICD-10-CM | POA: Diagnosis not present

## 2022-09-25 DIAGNOSIS — R2689 Other abnormalities of gait and mobility: Secondary | ICD-10-CM | POA: Diagnosis not present

## 2022-09-25 DIAGNOSIS — R2681 Unsteadiness on feet: Secondary | ICD-10-CM | POA: Diagnosis not present

## 2022-09-25 DIAGNOSIS — M9905 Segmental and somatic dysfunction of pelvic region: Secondary | ICD-10-CM | POA: Diagnosis not present

## 2022-09-25 DIAGNOSIS — M6281 Muscle weakness (generalized): Secondary | ICD-10-CM | POA: Diagnosis not present

## 2022-09-25 DIAGNOSIS — Z471 Aftercare following joint replacement surgery: Secondary | ICD-10-CM | POA: Diagnosis not present

## 2022-09-28 ENCOUNTER — Encounter: Payer: Self-pay | Admitting: *Deleted

## 2022-09-30 DIAGNOSIS — Z9181 History of falling: Secondary | ICD-10-CM | POA: Diagnosis not present

## 2022-09-30 DIAGNOSIS — M9905 Segmental and somatic dysfunction of pelvic region: Secondary | ICD-10-CM | POA: Diagnosis not present

## 2022-09-30 DIAGNOSIS — R2681 Unsteadiness on feet: Secondary | ICD-10-CM | POA: Diagnosis not present

## 2022-09-30 DIAGNOSIS — M6281 Muscle weakness (generalized): Secondary | ICD-10-CM | POA: Diagnosis not present

## 2022-09-30 DIAGNOSIS — R2689 Other abnormalities of gait and mobility: Secondary | ICD-10-CM | POA: Diagnosis not present

## 2022-09-30 DIAGNOSIS — Z471 Aftercare following joint replacement surgery: Secondary | ICD-10-CM | POA: Diagnosis not present

## 2022-10-02 DIAGNOSIS — M9905 Segmental and somatic dysfunction of pelvic region: Secondary | ICD-10-CM | POA: Diagnosis not present

## 2022-10-02 DIAGNOSIS — Z471 Aftercare following joint replacement surgery: Secondary | ICD-10-CM | POA: Diagnosis not present

## 2022-10-02 DIAGNOSIS — Z9181 History of falling: Secondary | ICD-10-CM | POA: Diagnosis not present

## 2022-10-02 DIAGNOSIS — R2681 Unsteadiness on feet: Secondary | ICD-10-CM | POA: Diagnosis not present

## 2022-10-02 DIAGNOSIS — M6281 Muscle weakness (generalized): Secondary | ICD-10-CM | POA: Diagnosis not present

## 2022-10-02 DIAGNOSIS — R2689 Other abnormalities of gait and mobility: Secondary | ICD-10-CM | POA: Diagnosis not present

## 2022-11-05 DIAGNOSIS — M545 Low back pain, unspecified: Secondary | ICD-10-CM | POA: Diagnosis not present

## 2022-11-13 DIAGNOSIS — R2689 Other abnormalities of gait and mobility: Secondary | ICD-10-CM | POA: Diagnosis not present

## 2022-11-13 DIAGNOSIS — Z9181 History of falling: Secondary | ICD-10-CM | POA: Diagnosis not present

## 2022-11-13 DIAGNOSIS — M6281 Muscle weakness (generalized): Secondary | ICD-10-CM | POA: Diagnosis not present

## 2022-11-13 DIAGNOSIS — M545 Low back pain, unspecified: Secondary | ICD-10-CM | POA: Diagnosis not present

## 2022-11-13 DIAGNOSIS — R2681 Unsteadiness on feet: Secondary | ICD-10-CM | POA: Diagnosis not present

## 2022-11-16 DIAGNOSIS — M545 Low back pain, unspecified: Secondary | ICD-10-CM | POA: Diagnosis not present

## 2022-11-16 DIAGNOSIS — R2681 Unsteadiness on feet: Secondary | ICD-10-CM | POA: Diagnosis not present

## 2022-11-16 DIAGNOSIS — Z9181 History of falling: Secondary | ICD-10-CM | POA: Diagnosis not present

## 2022-11-16 DIAGNOSIS — R2689 Other abnormalities of gait and mobility: Secondary | ICD-10-CM | POA: Diagnosis not present

## 2022-11-16 DIAGNOSIS — M6281 Muscle weakness (generalized): Secondary | ICD-10-CM | POA: Diagnosis not present

## 2022-11-20 DIAGNOSIS — M6281 Muscle weakness (generalized): Secondary | ICD-10-CM | POA: Diagnosis not present

## 2022-11-20 DIAGNOSIS — M545 Low back pain, unspecified: Secondary | ICD-10-CM | POA: Diagnosis not present

## 2022-11-20 DIAGNOSIS — Z9181 History of falling: Secondary | ICD-10-CM | POA: Diagnosis not present

## 2022-11-20 DIAGNOSIS — R2689 Other abnormalities of gait and mobility: Secondary | ICD-10-CM | POA: Diagnosis not present

## 2022-11-20 DIAGNOSIS — R2681 Unsteadiness on feet: Secondary | ICD-10-CM | POA: Diagnosis not present

## 2022-11-23 DIAGNOSIS — Z9181 History of falling: Secondary | ICD-10-CM | POA: Diagnosis not present

## 2022-11-23 DIAGNOSIS — M545 Low back pain, unspecified: Secondary | ICD-10-CM | POA: Diagnosis not present

## 2022-11-23 DIAGNOSIS — R2689 Other abnormalities of gait and mobility: Secondary | ICD-10-CM | POA: Diagnosis not present

## 2022-11-23 DIAGNOSIS — M6281 Muscle weakness (generalized): Secondary | ICD-10-CM | POA: Diagnosis not present

## 2022-11-23 DIAGNOSIS — R2681 Unsteadiness on feet: Secondary | ICD-10-CM | POA: Diagnosis not present

## 2022-11-25 DIAGNOSIS — R2689 Other abnormalities of gait and mobility: Secondary | ICD-10-CM | POA: Diagnosis not present

## 2022-11-25 DIAGNOSIS — M545 Low back pain, unspecified: Secondary | ICD-10-CM | POA: Diagnosis not present

## 2022-11-25 DIAGNOSIS — Z9181 History of falling: Secondary | ICD-10-CM | POA: Diagnosis not present

## 2022-11-25 DIAGNOSIS — M6281 Muscle weakness (generalized): Secondary | ICD-10-CM | POA: Diagnosis not present

## 2022-11-25 DIAGNOSIS — R2681 Unsteadiness on feet: Secondary | ICD-10-CM | POA: Diagnosis not present

## 2022-12-08 DIAGNOSIS — Z9181 History of falling: Secondary | ICD-10-CM | POA: Diagnosis not present

## 2022-12-08 DIAGNOSIS — R2689 Other abnormalities of gait and mobility: Secondary | ICD-10-CM | POA: Diagnosis not present

## 2022-12-08 DIAGNOSIS — M545 Low back pain, unspecified: Secondary | ICD-10-CM | POA: Diagnosis not present

## 2022-12-08 DIAGNOSIS — R2681 Unsteadiness on feet: Secondary | ICD-10-CM | POA: Diagnosis not present

## 2022-12-08 DIAGNOSIS — M6281 Muscle weakness (generalized): Secondary | ICD-10-CM | POA: Diagnosis not present

## 2022-12-11 DIAGNOSIS — R2689 Other abnormalities of gait and mobility: Secondary | ICD-10-CM | POA: Diagnosis not present

## 2022-12-11 DIAGNOSIS — R2681 Unsteadiness on feet: Secondary | ICD-10-CM | POA: Diagnosis not present

## 2022-12-11 DIAGNOSIS — M545 Low back pain, unspecified: Secondary | ICD-10-CM | POA: Diagnosis not present

## 2022-12-11 DIAGNOSIS — Z9181 History of falling: Secondary | ICD-10-CM | POA: Diagnosis not present

## 2022-12-11 DIAGNOSIS — M6281 Muscle weakness (generalized): Secondary | ICD-10-CM | POA: Diagnosis not present

## 2022-12-14 DIAGNOSIS — M6281 Muscle weakness (generalized): Secondary | ICD-10-CM | POA: Diagnosis not present

## 2022-12-14 DIAGNOSIS — M545 Low back pain, unspecified: Secondary | ICD-10-CM | POA: Diagnosis not present

## 2022-12-14 DIAGNOSIS — R2681 Unsteadiness on feet: Secondary | ICD-10-CM | POA: Diagnosis not present

## 2022-12-14 DIAGNOSIS — Z9181 History of falling: Secondary | ICD-10-CM | POA: Diagnosis not present

## 2022-12-14 DIAGNOSIS — R2689 Other abnormalities of gait and mobility: Secondary | ICD-10-CM | POA: Diagnosis not present

## 2022-12-16 DIAGNOSIS — M6281 Muscle weakness (generalized): Secondary | ICD-10-CM | POA: Diagnosis not present

## 2022-12-16 DIAGNOSIS — R2681 Unsteadiness on feet: Secondary | ICD-10-CM | POA: Diagnosis not present

## 2022-12-16 DIAGNOSIS — Z9181 History of falling: Secondary | ICD-10-CM | POA: Diagnosis not present

## 2022-12-16 DIAGNOSIS — M545 Low back pain, unspecified: Secondary | ICD-10-CM | POA: Diagnosis not present

## 2022-12-16 DIAGNOSIS — R2689 Other abnormalities of gait and mobility: Secondary | ICD-10-CM | POA: Diagnosis not present

## 2022-12-21 DIAGNOSIS — R2681 Unsteadiness on feet: Secondary | ICD-10-CM | POA: Diagnosis not present

## 2022-12-21 DIAGNOSIS — Z9181 History of falling: Secondary | ICD-10-CM | POA: Diagnosis not present

## 2022-12-21 DIAGNOSIS — M6281 Muscle weakness (generalized): Secondary | ICD-10-CM | POA: Diagnosis not present

## 2022-12-21 DIAGNOSIS — R2689 Other abnormalities of gait and mobility: Secondary | ICD-10-CM | POA: Diagnosis not present

## 2022-12-21 DIAGNOSIS — M545 Low back pain, unspecified: Secondary | ICD-10-CM | POA: Diagnosis not present

## 2022-12-23 DIAGNOSIS — M545 Low back pain, unspecified: Secondary | ICD-10-CM | POA: Diagnosis not present

## 2022-12-23 DIAGNOSIS — Z9181 History of falling: Secondary | ICD-10-CM | POA: Diagnosis not present

## 2022-12-23 DIAGNOSIS — M6281 Muscle weakness (generalized): Secondary | ICD-10-CM | POA: Diagnosis not present

## 2022-12-23 DIAGNOSIS — R2689 Other abnormalities of gait and mobility: Secondary | ICD-10-CM | POA: Diagnosis not present

## 2022-12-23 DIAGNOSIS — R2681 Unsteadiness on feet: Secondary | ICD-10-CM | POA: Diagnosis not present

## 2022-12-30 DIAGNOSIS — R2689 Other abnormalities of gait and mobility: Secondary | ICD-10-CM | POA: Diagnosis not present

## 2022-12-30 DIAGNOSIS — M6281 Muscle weakness (generalized): Secondary | ICD-10-CM | POA: Diagnosis not present

## 2022-12-30 DIAGNOSIS — M545 Low back pain, unspecified: Secondary | ICD-10-CM | POA: Diagnosis not present

## 2022-12-30 DIAGNOSIS — R2681 Unsteadiness on feet: Secondary | ICD-10-CM | POA: Diagnosis not present

## 2022-12-30 DIAGNOSIS — Z9181 History of falling: Secondary | ICD-10-CM | POA: Diagnosis not present

## 2023-01-01 DIAGNOSIS — Z9181 History of falling: Secondary | ICD-10-CM | POA: Diagnosis not present

## 2023-01-01 DIAGNOSIS — M6281 Muscle weakness (generalized): Secondary | ICD-10-CM | POA: Diagnosis not present

## 2023-01-01 DIAGNOSIS — R2681 Unsteadiness on feet: Secondary | ICD-10-CM | POA: Diagnosis not present

## 2023-01-01 DIAGNOSIS — M545 Low back pain, unspecified: Secondary | ICD-10-CM | POA: Diagnosis not present

## 2023-01-01 DIAGNOSIS — R2689 Other abnormalities of gait and mobility: Secondary | ICD-10-CM | POA: Diagnosis not present

## 2023-01-04 DIAGNOSIS — R2681 Unsteadiness on feet: Secondary | ICD-10-CM | POA: Diagnosis not present

## 2023-01-04 DIAGNOSIS — R2689 Other abnormalities of gait and mobility: Secondary | ICD-10-CM | POA: Diagnosis not present

## 2023-01-04 DIAGNOSIS — M545 Low back pain, unspecified: Secondary | ICD-10-CM | POA: Diagnosis not present

## 2023-01-04 DIAGNOSIS — M6281 Muscle weakness (generalized): Secondary | ICD-10-CM | POA: Diagnosis not present

## 2023-01-04 DIAGNOSIS — Z9181 History of falling: Secondary | ICD-10-CM | POA: Diagnosis not present

## 2023-01-07 DIAGNOSIS — R2689 Other abnormalities of gait and mobility: Secondary | ICD-10-CM | POA: Diagnosis not present

## 2023-01-07 DIAGNOSIS — R2681 Unsteadiness on feet: Secondary | ICD-10-CM | POA: Diagnosis not present

## 2023-01-07 DIAGNOSIS — M6281 Muscle weakness (generalized): Secondary | ICD-10-CM | POA: Diagnosis not present

## 2023-01-07 DIAGNOSIS — M545 Low back pain, unspecified: Secondary | ICD-10-CM | POA: Diagnosis not present

## 2023-01-07 DIAGNOSIS — Z9181 History of falling: Secondary | ICD-10-CM | POA: Diagnosis not present

## 2023-01-11 DIAGNOSIS — M6281 Muscle weakness (generalized): Secondary | ICD-10-CM | POA: Diagnosis not present

## 2023-01-11 DIAGNOSIS — R2689 Other abnormalities of gait and mobility: Secondary | ICD-10-CM | POA: Diagnosis not present

## 2023-01-11 DIAGNOSIS — R2681 Unsteadiness on feet: Secondary | ICD-10-CM | POA: Diagnosis not present

## 2023-01-11 DIAGNOSIS — Z9181 History of falling: Secondary | ICD-10-CM | POA: Diagnosis not present

## 2023-01-11 DIAGNOSIS — M545 Low back pain, unspecified: Secondary | ICD-10-CM | POA: Diagnosis not present

## 2023-01-14 DIAGNOSIS — Z9181 History of falling: Secondary | ICD-10-CM | POA: Diagnosis not present

## 2023-01-14 DIAGNOSIS — M6281 Muscle weakness (generalized): Secondary | ICD-10-CM | POA: Diagnosis not present

## 2023-01-14 DIAGNOSIS — R2689 Other abnormalities of gait and mobility: Secondary | ICD-10-CM | POA: Diagnosis not present

## 2023-01-14 DIAGNOSIS — M545 Low back pain, unspecified: Secondary | ICD-10-CM | POA: Diagnosis not present

## 2023-01-14 DIAGNOSIS — R2681 Unsteadiness on feet: Secondary | ICD-10-CM | POA: Diagnosis not present

## 2023-01-18 DIAGNOSIS — M545 Low back pain, unspecified: Secondary | ICD-10-CM | POA: Diagnosis not present

## 2023-01-18 DIAGNOSIS — R2681 Unsteadiness on feet: Secondary | ICD-10-CM | POA: Diagnosis not present

## 2023-01-18 DIAGNOSIS — R2689 Other abnormalities of gait and mobility: Secondary | ICD-10-CM | POA: Diagnosis not present

## 2023-01-18 DIAGNOSIS — Z9181 History of falling: Secondary | ICD-10-CM | POA: Diagnosis not present

## 2023-01-18 DIAGNOSIS — M6281 Muscle weakness (generalized): Secondary | ICD-10-CM | POA: Diagnosis not present

## 2023-01-21 DIAGNOSIS — R2689 Other abnormalities of gait and mobility: Secondary | ICD-10-CM | POA: Diagnosis not present

## 2023-01-21 DIAGNOSIS — M6281 Muscle weakness (generalized): Secondary | ICD-10-CM | POA: Diagnosis not present

## 2023-01-21 DIAGNOSIS — R2681 Unsteadiness on feet: Secondary | ICD-10-CM | POA: Diagnosis not present

## 2023-01-21 DIAGNOSIS — Z9181 History of falling: Secondary | ICD-10-CM | POA: Diagnosis not present

## 2023-01-21 DIAGNOSIS — M545 Low back pain, unspecified: Secondary | ICD-10-CM | POA: Diagnosis not present

## 2023-01-26 DIAGNOSIS — Z9181 History of falling: Secondary | ICD-10-CM | POA: Diagnosis not present

## 2023-01-26 DIAGNOSIS — R2689 Other abnormalities of gait and mobility: Secondary | ICD-10-CM | POA: Diagnosis not present

## 2023-01-26 DIAGNOSIS — M6281 Muscle weakness (generalized): Secondary | ICD-10-CM | POA: Diagnosis not present

## 2023-01-26 DIAGNOSIS — M545 Low back pain, unspecified: Secondary | ICD-10-CM | POA: Diagnosis not present

## 2023-01-26 DIAGNOSIS — R2681 Unsteadiness on feet: Secondary | ICD-10-CM | POA: Diagnosis not present

## 2023-03-02 DIAGNOSIS — K7030 Alcoholic cirrhosis of liver without ascites: Secondary | ICD-10-CM | POA: Diagnosis not present

## 2023-03-02 DIAGNOSIS — E785 Hyperlipidemia, unspecified: Secondary | ICD-10-CM | POA: Diagnosis not present

## 2023-03-02 DIAGNOSIS — R7303 Prediabetes: Secondary | ICD-10-CM | POA: Diagnosis not present

## 2023-03-02 DIAGNOSIS — I10 Essential (primary) hypertension: Secondary | ICD-10-CM | POA: Diagnosis not present

## 2023-03-02 DIAGNOSIS — E559 Vitamin D deficiency, unspecified: Secondary | ICD-10-CM | POA: Diagnosis not present

## 2023-03-02 DIAGNOSIS — D709 Neutropenia, unspecified: Secondary | ICD-10-CM | POA: Diagnosis not present

## 2023-03-02 DIAGNOSIS — D696 Thrombocytopenia, unspecified: Secondary | ICD-10-CM | POA: Diagnosis not present

## 2023-03-02 DIAGNOSIS — K219 Gastro-esophageal reflux disease without esophagitis: Secondary | ICD-10-CM | POA: Diagnosis not present

## 2023-03-09 DIAGNOSIS — J42 Unspecified chronic bronchitis: Secondary | ICD-10-CM | POA: Diagnosis not present

## 2023-03-09 DIAGNOSIS — I10 Essential (primary) hypertension: Secondary | ICD-10-CM | POA: Diagnosis not present

## 2023-03-09 DIAGNOSIS — Z0000 Encounter for general adult medical examination without abnormal findings: Secondary | ICD-10-CM | POA: Diagnosis not present

## 2023-03-09 DIAGNOSIS — E559 Vitamin D deficiency, unspecified: Secondary | ICD-10-CM | POA: Diagnosis not present

## 2023-03-09 DIAGNOSIS — M8589 Other specified disorders of bone density and structure, multiple sites: Secondary | ICD-10-CM | POA: Diagnosis not present

## 2023-03-09 DIAGNOSIS — R7401 Elevation of levels of liver transaminase levels: Secondary | ICD-10-CM | POA: Diagnosis not present

## 2023-03-09 DIAGNOSIS — M8580 Other specified disorders of bone density and structure, unspecified site: Secondary | ICD-10-CM | POA: Diagnosis not present

## 2023-03-09 DIAGNOSIS — Z23 Encounter for immunization: Secondary | ICD-10-CM | POA: Diagnosis not present

## 2023-03-09 DIAGNOSIS — K219 Gastro-esophageal reflux disease without esophagitis: Secondary | ICD-10-CM | POA: Diagnosis not present

## 2023-03-09 DIAGNOSIS — K7030 Alcoholic cirrhosis of liver without ascites: Secondary | ICD-10-CM | POA: Diagnosis not present

## 2023-03-26 DIAGNOSIS — D1801 Hemangioma of skin and subcutaneous tissue: Secondary | ICD-10-CM | POA: Diagnosis not present

## 2023-03-26 DIAGNOSIS — L821 Other seborrheic keratosis: Secondary | ICD-10-CM | POA: Diagnosis not present

## 2023-03-26 DIAGNOSIS — L814 Other melanin hyperpigmentation: Secondary | ICD-10-CM | POA: Diagnosis not present

## 2023-03-26 DIAGNOSIS — L820 Inflamed seborrheic keratosis: Secondary | ICD-10-CM | POA: Diagnosis not present

## 2023-04-02 ENCOUNTER — Other Ambulatory Visit: Payer: Self-pay | Admitting: Family Medicine

## 2023-04-02 DIAGNOSIS — Z1231 Encounter for screening mammogram for malignant neoplasm of breast: Secondary | ICD-10-CM

## 2023-04-23 DIAGNOSIS — L814 Other melanin hyperpigmentation: Secondary | ICD-10-CM | POA: Diagnosis not present

## 2023-04-23 DIAGNOSIS — L821 Other seborrheic keratosis: Secondary | ICD-10-CM | POA: Diagnosis not present

## 2023-04-23 DIAGNOSIS — L820 Inflamed seborrheic keratosis: Secondary | ICD-10-CM | POA: Diagnosis not present

## 2023-04-28 ENCOUNTER — Ambulatory Visit
Admission: RE | Admit: 2023-04-28 | Discharge: 2023-04-28 | Disposition: A | Discharge status: Home / Self Care | Payer: Medicare Other | Source: Ambulatory Visit | Attending: Family Medicine | Admitting: Family Medicine

## 2023-04-28 DIAGNOSIS — Z1231 Encounter for screening mammogram for malignant neoplasm of breast: Secondary | ICD-10-CM | POA: Diagnosis not present

## 2023-07-02 IMAGING — MG MM DIGITAL SCREENING BILAT W/ TOMO AND CAD
8 series · 8 of 24 positions shown · non-contrast
Comparison: Previous exam(s).

CLINICAL DATA: Screening.

EXAM:
DIGITAL SCREENING BILATERAL MAMMOGRAM WITH TOMOSYNTHESIS AND CAD
TECHNIQUE: Bilateral screening digital craniocaudal and mediolateral oblique
mammograms were obtained. Bilateral screening digital breast
tomosynthesis was performed. The images were evaluated with
computer-aided detection.

[L CC synth-2D]
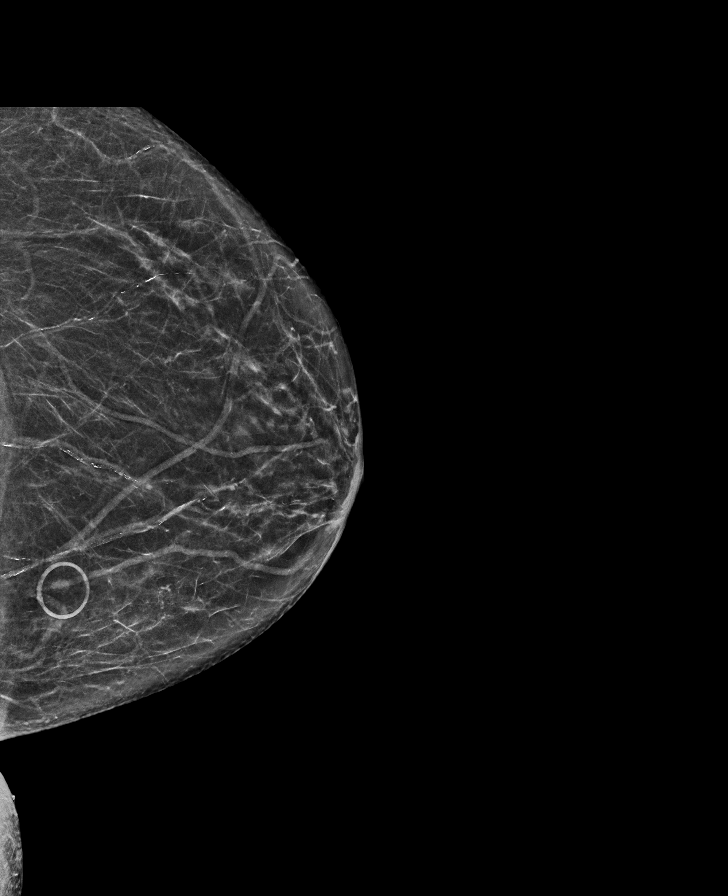

[L MLO synth-2D]
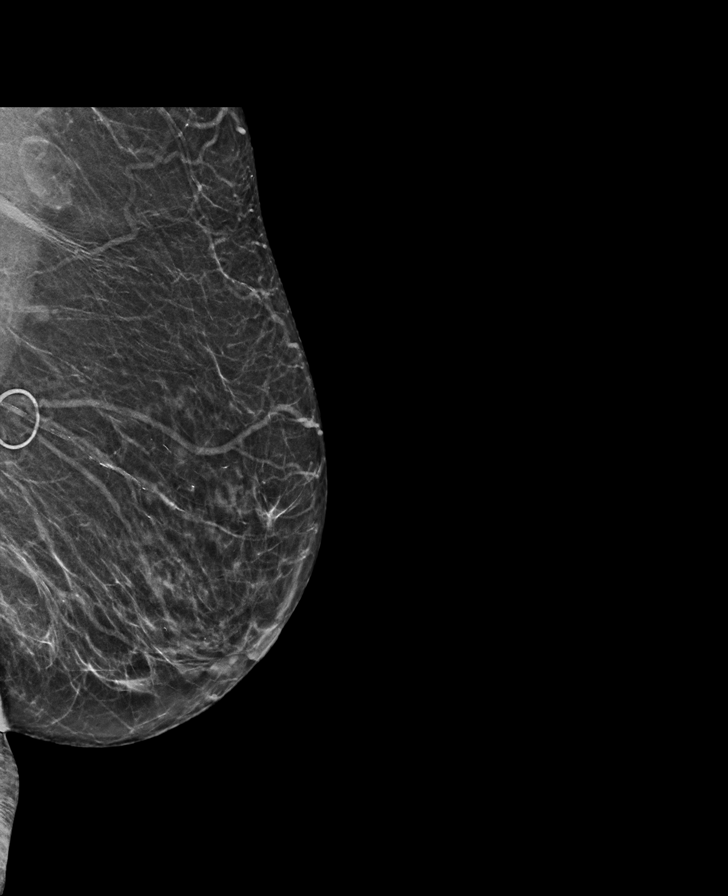

[R CC synth-2D]
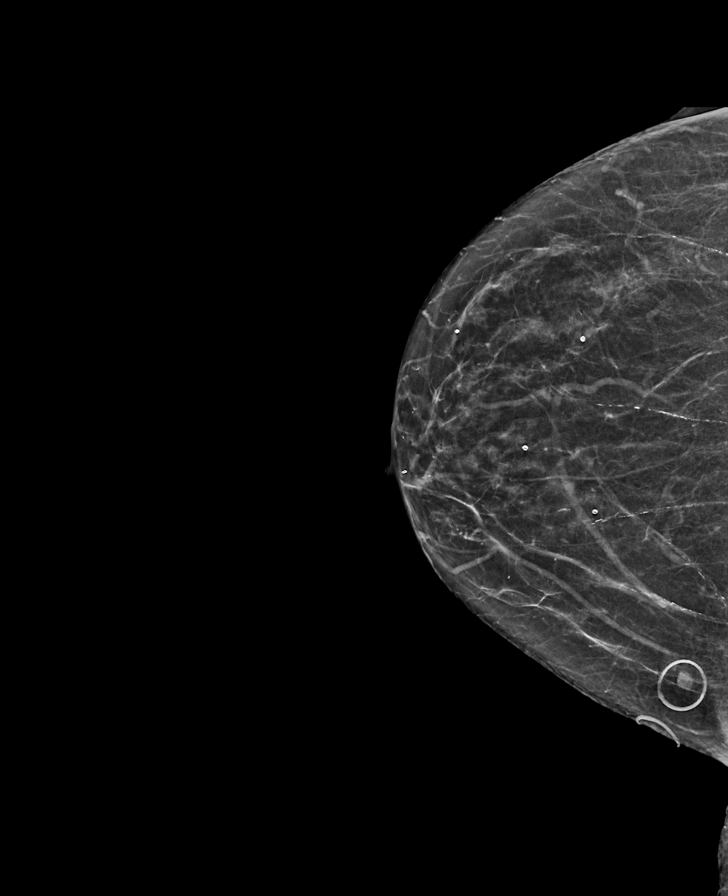

[R MLO synth-2D]
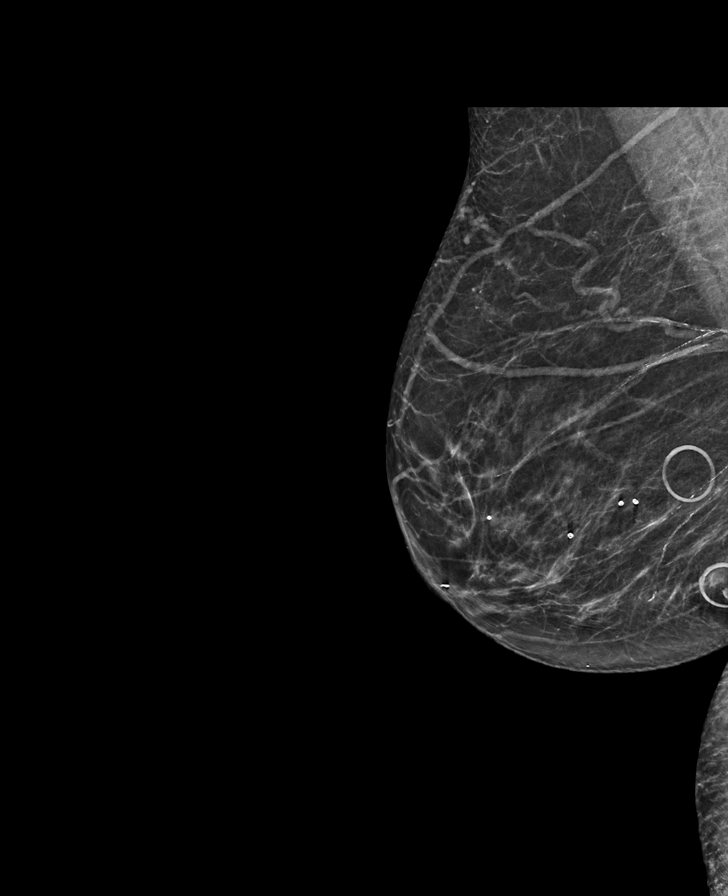

[R CC tomo · tomo slice 27/53.0]
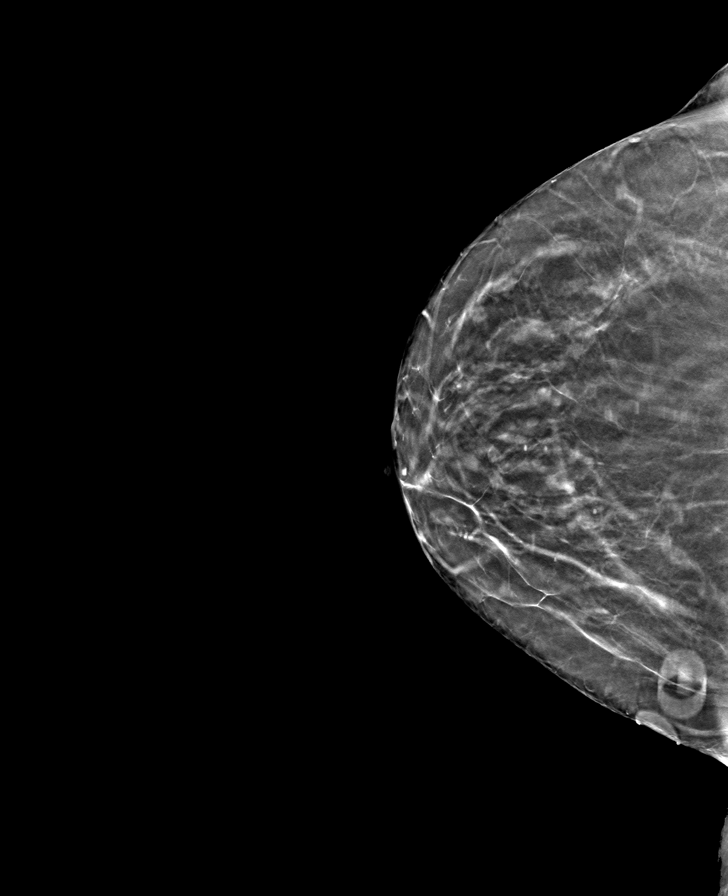

[L CC tomo · tomo slice 28/55.0]
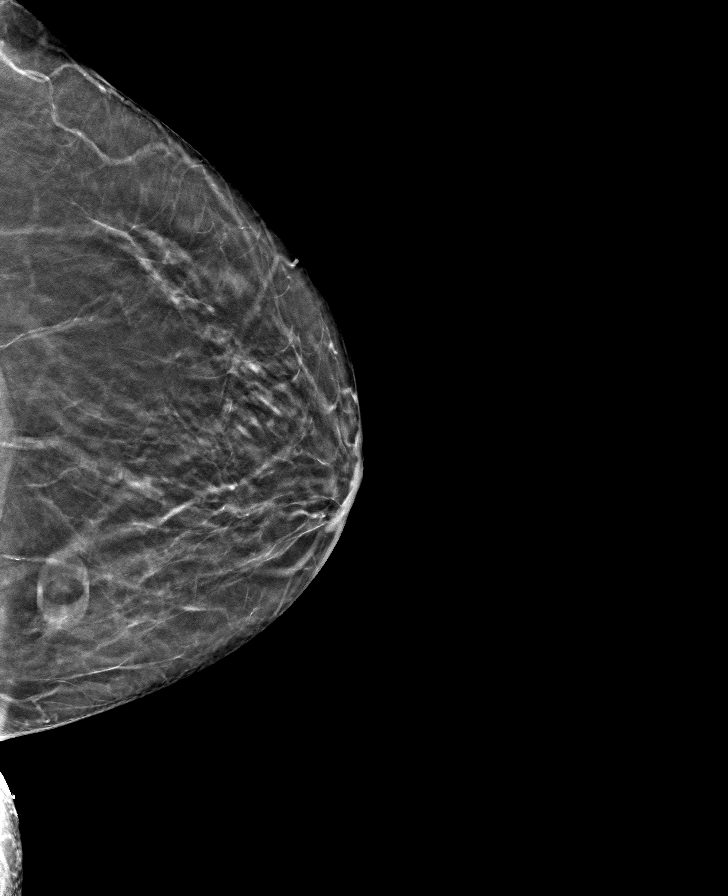

[R MLO tomo · tomo slice 29/58.0]
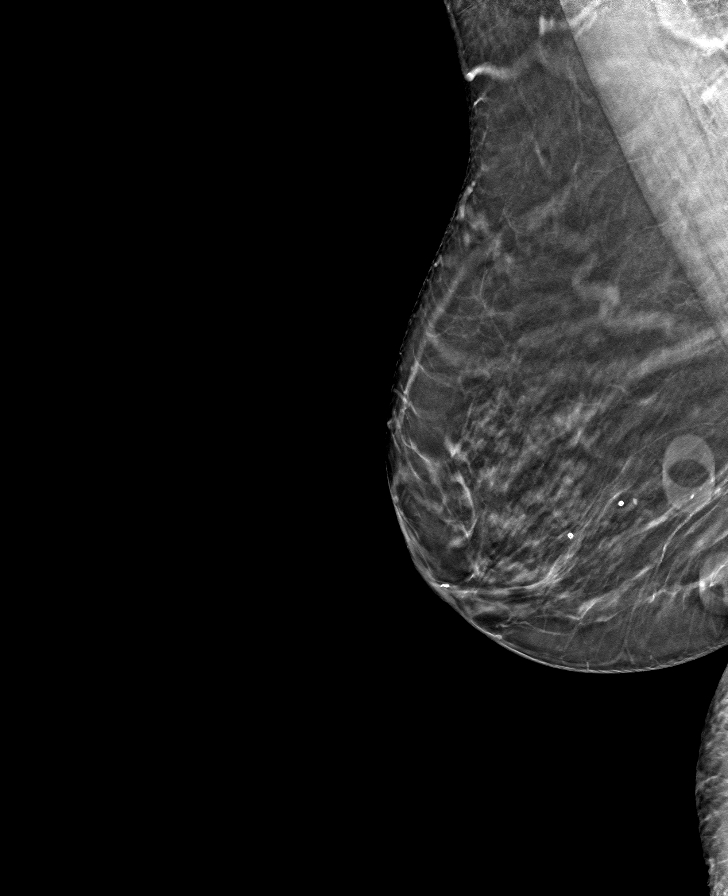

[L MLO tomo · tomo slice 31/61.0]
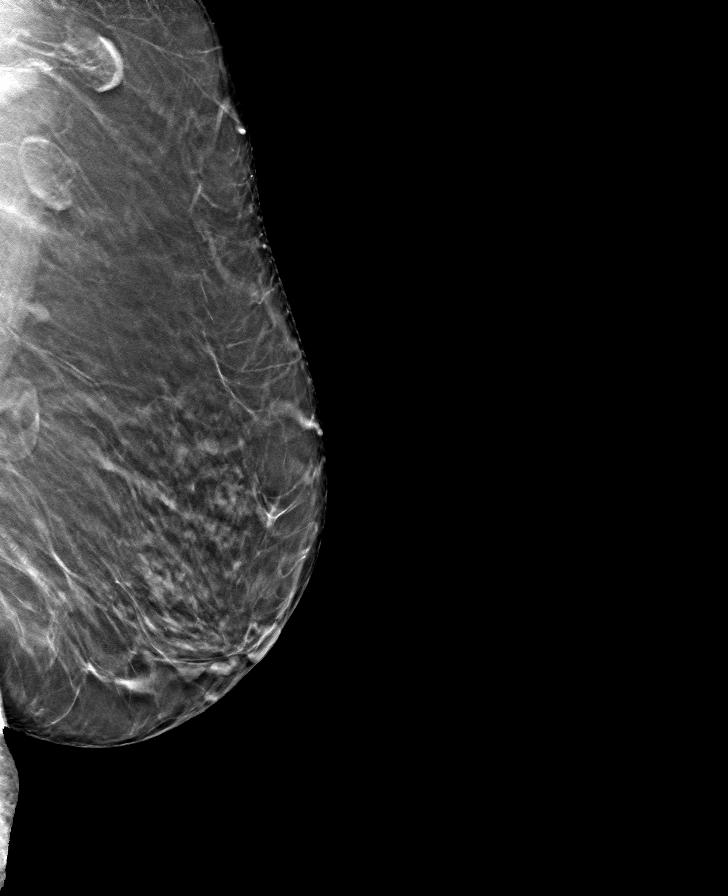

[8 of 24 positions shown; findings below may reference images not displayed]

ACR Breast Density Category b: There are scattered areas of
fibroglandular density.
FINDINGS: There are no findings suspicious for malignancy.
IMPRESSION: No mammographic evidence of malignancy. A result letter of this
screening mammogram will be mailed directly to the patient.

RECOMMENDATION:
Screening mammogram in one year. (Code:[id number])

BI-RADS CATEGORY  1: Negative.

## 2023-08-11 DIAGNOSIS — M545 Low back pain, unspecified: Secondary | ICD-10-CM | POA: Diagnosis not present

## 2023-09-14 DIAGNOSIS — K7030 Alcoholic cirrhosis of liver without ascites: Secondary | ICD-10-CM | POA: Diagnosis not present

## 2023-09-14 DIAGNOSIS — R7401 Elevation of levels of liver transaminase levels: Secondary | ICD-10-CM | POA: Diagnosis not present

## 2023-09-14 DIAGNOSIS — K219 Gastro-esophageal reflux disease without esophagitis: Secondary | ICD-10-CM | POA: Diagnosis not present

## 2023-09-14 DIAGNOSIS — I10 Essential (primary) hypertension: Secondary | ICD-10-CM | POA: Diagnosis not present

## 2023-09-21 DIAGNOSIS — Z23 Encounter for immunization: Secondary | ICD-10-CM | POA: Diagnosis not present

## 2023-09-21 DIAGNOSIS — K7030 Alcoholic cirrhosis of liver without ascites: Secondary | ICD-10-CM | POA: Diagnosis not present

## 2023-09-21 DIAGNOSIS — D709 Neutropenia, unspecified: Secondary | ICD-10-CM | POA: Diagnosis not present

## 2023-09-21 DIAGNOSIS — Z79899 Other long term (current) drug therapy: Secondary | ICD-10-CM | POA: Diagnosis not present

## 2023-09-21 DIAGNOSIS — R7989 Other specified abnormal findings of blood chemistry: Secondary | ICD-10-CM | POA: Diagnosis not present

## 2023-09-21 DIAGNOSIS — I700 Atherosclerosis of aorta: Secondary | ICD-10-CM | POA: Diagnosis not present

## 2023-09-21 DIAGNOSIS — D696 Thrombocytopenia, unspecified: Secondary | ICD-10-CM | POA: Diagnosis not present

## 2023-09-21 DIAGNOSIS — K219 Gastro-esophageal reflux disease without esophagitis: Secondary | ICD-10-CM | POA: Diagnosis not present

## 2023-09-21 DIAGNOSIS — R748 Abnormal levels of other serum enzymes: Secondary | ICD-10-CM | POA: Diagnosis not present

## 2024-01-11 DIAGNOSIS — R109 Unspecified abdominal pain: Secondary | ICD-10-CM | POA: Diagnosis not present

## 2024-01-11 DIAGNOSIS — R748 Abnormal levels of other serum enzymes: Secondary | ICD-10-CM | POA: Diagnosis not present

## 2024-01-11 DIAGNOSIS — Z79899 Other long term (current) drug therapy: Secondary | ICD-10-CM | POA: Diagnosis not present

## 2024-01-11 DIAGNOSIS — R7989 Other specified abnormal findings of blood chemistry: Secondary | ICD-10-CM | POA: Diagnosis not present

## 2024-01-11 DIAGNOSIS — R1013 Epigastric pain: Secondary | ICD-10-CM | POA: Diagnosis not present

## 2024-01-11 DIAGNOSIS — K7031 Alcoholic cirrhosis of liver with ascites: Secondary | ICD-10-CM | POA: Diagnosis not present

## 2024-01-11 DIAGNOSIS — K219 Gastro-esophageal reflux disease without esophagitis: Secondary | ICD-10-CM | POA: Diagnosis not present

## 2024-01-11 DIAGNOSIS — K7030 Alcoholic cirrhosis of liver without ascites: Secondary | ICD-10-CM | POA: Diagnosis not present

## 2024-01-12 ENCOUNTER — Other Ambulatory Visit: Payer: Self-pay | Admitting: Family Medicine

## 2024-01-12 DIAGNOSIS — R1013 Epigastric pain: Secondary | ICD-10-CM

## 2024-01-12 DIAGNOSIS — K7031 Alcoholic cirrhosis of liver with ascites: Secondary | ICD-10-CM

## 2024-01-12 DIAGNOSIS — R748 Abnormal levels of other serum enzymes: Secondary | ICD-10-CM

## 2024-01-17 ENCOUNTER — Ambulatory Visit
Admission: RE | Admit: 2024-01-17 | Discharge: 2024-01-17 | Disposition: A | Discharge status: Home / Self Care | Payer: Medicare Other | Source: Ambulatory Visit | Attending: Family Medicine | Admitting: Family Medicine

## 2024-01-17 DIAGNOSIS — K449 Diaphragmatic hernia without obstruction or gangrene: Secondary | ICD-10-CM | POA: Diagnosis not present

## 2024-01-17 DIAGNOSIS — K8020 Calculus of gallbladder without cholecystitis without obstruction: Secondary | ICD-10-CM | POA: Diagnosis not present

## 2024-01-17 DIAGNOSIS — R1031 Right lower quadrant pain: Secondary | ICD-10-CM | POA: Diagnosis not present

## 2024-01-17 DIAGNOSIS — K7031 Alcoholic cirrhosis of liver with ascites: Secondary | ICD-10-CM

## 2024-01-17 DIAGNOSIS — R1013 Epigastric pain: Secondary | ICD-10-CM

## 2024-01-17 DIAGNOSIS — R748 Abnormal levels of other serum enzymes: Secondary | ICD-10-CM

## 2024-01-17 MED ORDER — IOPAMIDOL (ISOVUE-300) INJECTION 61%
100.0000 mL | Freq: Once | INTRAVENOUS | Status: AC | PRN
  Administered 2024-01-17: 100 mL via INTRAVENOUS

## 2024-03-02 DIAGNOSIS — K219 Gastro-esophageal reflux disease without esophagitis: Secondary | ICD-10-CM | POA: Diagnosis not present

## 2024-03-02 DIAGNOSIS — D696 Thrombocytopenia, unspecified: Secondary | ICD-10-CM | POA: Diagnosis not present

## 2024-03-02 DIAGNOSIS — K7030 Alcoholic cirrhosis of liver without ascites: Secondary | ICD-10-CM | POA: Diagnosis not present

## 2024-03-02 DIAGNOSIS — R7989 Other specified abnormal findings of blood chemistry: Secondary | ICD-10-CM | POA: Diagnosis not present

## 2024-03-02 DIAGNOSIS — D709 Neutropenia, unspecified: Secondary | ICD-10-CM | POA: Diagnosis not present

## 2024-03-02 DIAGNOSIS — Z79899 Other long term (current) drug therapy: Secondary | ICD-10-CM | POA: Diagnosis not present

## 2024-03-02 DIAGNOSIS — R748 Abnormal levels of other serum enzymes: Secondary | ICD-10-CM | POA: Diagnosis not present

## 2024-03-09 DIAGNOSIS — K219 Gastro-esophageal reflux disease without esophagitis: Secondary | ICD-10-CM | POA: Diagnosis not present

## 2024-03-09 DIAGNOSIS — R748 Abnormal levels of other serum enzymes: Secondary | ICD-10-CM | POA: Diagnosis not present

## 2024-03-09 DIAGNOSIS — Z0000 Encounter for general adult medical examination without abnormal findings: Secondary | ICD-10-CM | POA: Diagnosis not present

## 2024-03-09 DIAGNOSIS — K7030 Alcoholic cirrhosis of liver without ascites: Secondary | ICD-10-CM | POA: Diagnosis not present

## 2024-03-09 DIAGNOSIS — Z79899 Other long term (current) drug therapy: Secondary | ICD-10-CM | POA: Diagnosis not present

## 2024-03-09 DIAGNOSIS — D696 Thrombocytopenia, unspecified: Secondary | ICD-10-CM | POA: Diagnosis not present

## 2024-03-09 DIAGNOSIS — H9193 Unspecified hearing loss, bilateral: Secondary | ICD-10-CM | POA: Diagnosis not present

## 2024-03-09 DIAGNOSIS — H6120 Impacted cerumen, unspecified ear: Secondary | ICD-10-CM | POA: Diagnosis not present

## 2024-03-28 DIAGNOSIS — H60332 Swimmer's ear, left ear: Secondary | ICD-10-CM | POA: Diagnosis not present

## 2024-03-28 DIAGNOSIS — H903 Sensorineural hearing loss, bilateral: Secondary | ICD-10-CM | POA: Diagnosis not present

## 2024-05-02 DIAGNOSIS — R8290 Unspecified abnormal findings in urine: Secondary | ICD-10-CM | POA: Diagnosis not present

## 2024-05-11 ENCOUNTER — Other Ambulatory Visit: Payer: Self-pay | Admitting: Family Medicine

## 2024-05-11 DIAGNOSIS — Z9181 History of falling: Secondary | ICD-10-CM | POA: Diagnosis not present

## 2024-05-11 DIAGNOSIS — Z1231 Encounter for screening mammogram for malignant neoplasm of breast: Secondary | ICD-10-CM

## 2024-05-11 DIAGNOSIS — M545 Low back pain, unspecified: Secondary | ICD-10-CM | POA: Diagnosis not present

## 2024-05-11 DIAGNOSIS — M6281 Muscle weakness (generalized): Secondary | ICD-10-CM | POA: Diagnosis not present

## 2024-05-11 DIAGNOSIS — R2689 Other abnormalities of gait and mobility: Secondary | ICD-10-CM | POA: Diagnosis not present

## 2024-05-16 DIAGNOSIS — Z9181 History of falling: Secondary | ICD-10-CM | POA: Diagnosis not present

## 2024-05-16 DIAGNOSIS — M6281 Muscle weakness (generalized): Secondary | ICD-10-CM | POA: Diagnosis not present

## 2024-05-16 DIAGNOSIS — M545 Low back pain, unspecified: Secondary | ICD-10-CM | POA: Diagnosis not present

## 2024-05-16 DIAGNOSIS — R2689 Other abnormalities of gait and mobility: Secondary | ICD-10-CM | POA: Diagnosis not present

## 2024-05-18 DIAGNOSIS — Z9181 History of falling: Secondary | ICD-10-CM | POA: Diagnosis not present

## 2024-05-18 DIAGNOSIS — M6281 Muscle weakness (generalized): Secondary | ICD-10-CM | POA: Diagnosis not present

## 2024-05-18 DIAGNOSIS — R2689 Other abnormalities of gait and mobility: Secondary | ICD-10-CM | POA: Diagnosis not present

## 2024-05-18 DIAGNOSIS — M545 Low back pain, unspecified: Secondary | ICD-10-CM | POA: Diagnosis not present

## 2024-05-23 DIAGNOSIS — Z9181 History of falling: Secondary | ICD-10-CM | POA: Diagnosis not present

## 2024-05-23 DIAGNOSIS — M545 Low back pain, unspecified: Secondary | ICD-10-CM | POA: Diagnosis not present

## 2024-05-23 DIAGNOSIS — R2689 Other abnormalities of gait and mobility: Secondary | ICD-10-CM | POA: Diagnosis not present

## 2024-05-23 DIAGNOSIS — M6281 Muscle weakness (generalized): Secondary | ICD-10-CM | POA: Diagnosis not present

## 2024-05-25 ENCOUNTER — Ambulatory Visit
Admission: RE | Admit: 2024-05-25 | Discharge: 2024-05-25 | Disposition: A | Discharge status: Home / Self Care | Source: Ambulatory Visit | Attending: Family Medicine | Admitting: Family Medicine

## 2024-05-25 DIAGNOSIS — Z1231 Encounter for screening mammogram for malignant neoplasm of breast: Secondary | ICD-10-CM | POA: Insufficient documentation

## 2024-05-25 DIAGNOSIS — Z9181 History of falling: Secondary | ICD-10-CM | POA: Diagnosis not present

## 2024-05-25 DIAGNOSIS — M6281 Muscle weakness (generalized): Secondary | ICD-10-CM | POA: Diagnosis not present

## 2024-05-25 DIAGNOSIS — R2689 Other abnormalities of gait and mobility: Secondary | ICD-10-CM | POA: Diagnosis not present

## 2024-05-25 DIAGNOSIS — M545 Low back pain, unspecified: Secondary | ICD-10-CM | POA: Diagnosis not present

## 2024-05-30 DIAGNOSIS — M545 Low back pain, unspecified: Secondary | ICD-10-CM | POA: Diagnosis not present

## 2024-05-30 DIAGNOSIS — R2689 Other abnormalities of gait and mobility: Secondary | ICD-10-CM | POA: Diagnosis not present

## 2024-05-30 DIAGNOSIS — Z9181 History of falling: Secondary | ICD-10-CM | POA: Diagnosis not present

## 2024-05-30 DIAGNOSIS — M6281 Muscle weakness (generalized): Secondary | ICD-10-CM | POA: Diagnosis not present

## 2024-06-01 DIAGNOSIS — R2689 Other abnormalities of gait and mobility: Secondary | ICD-10-CM | POA: Diagnosis not present

## 2024-06-01 DIAGNOSIS — Z9181 History of falling: Secondary | ICD-10-CM | POA: Diagnosis not present

## 2024-06-01 DIAGNOSIS — M6281 Muscle weakness (generalized): Secondary | ICD-10-CM | POA: Diagnosis not present

## 2024-06-01 DIAGNOSIS — M545 Low back pain, unspecified: Secondary | ICD-10-CM | POA: Diagnosis not present

## 2024-06-06 DIAGNOSIS — M6281 Muscle weakness (generalized): Secondary | ICD-10-CM | POA: Diagnosis not present

## 2024-06-06 DIAGNOSIS — R2689 Other abnormalities of gait and mobility: Secondary | ICD-10-CM | POA: Diagnosis not present

## 2024-06-06 DIAGNOSIS — Z9181 History of falling: Secondary | ICD-10-CM | POA: Diagnosis not present

## 2024-06-06 DIAGNOSIS — M545 Low back pain, unspecified: Secondary | ICD-10-CM | POA: Diagnosis not present

## 2024-06-08 DIAGNOSIS — Z9181 History of falling: Secondary | ICD-10-CM | POA: Diagnosis not present

## 2024-06-08 DIAGNOSIS — M6281 Muscle weakness (generalized): Secondary | ICD-10-CM | POA: Diagnosis not present

## 2024-06-08 DIAGNOSIS — R2689 Other abnormalities of gait and mobility: Secondary | ICD-10-CM | POA: Diagnosis not present

## 2024-06-08 DIAGNOSIS — M545 Low back pain, unspecified: Secondary | ICD-10-CM | POA: Diagnosis not present

## 2024-06-13 DIAGNOSIS — M545 Low back pain, unspecified: Secondary | ICD-10-CM | POA: Diagnosis not present

## 2024-06-13 DIAGNOSIS — R2689 Other abnormalities of gait and mobility: Secondary | ICD-10-CM | POA: Diagnosis not present

## 2024-06-13 DIAGNOSIS — M6281 Muscle weakness (generalized): Secondary | ICD-10-CM | POA: Diagnosis not present

## 2024-06-13 DIAGNOSIS — Z9181 History of falling: Secondary | ICD-10-CM | POA: Diagnosis not present

## 2024-06-14 DIAGNOSIS — E559 Vitamin D deficiency, unspecified: Secondary | ICD-10-CM | POA: Diagnosis not present

## 2024-06-14 DIAGNOSIS — R2689 Other abnormalities of gait and mobility: Secondary | ICD-10-CM | POA: Diagnosis not present

## 2024-06-14 DIAGNOSIS — K7030 Alcoholic cirrhosis of liver without ascites: Secondary | ICD-10-CM | POA: Diagnosis not present

## 2024-06-14 DIAGNOSIS — R270 Ataxia, unspecified: Secondary | ICD-10-CM | POA: Diagnosis not present

## 2024-06-14 DIAGNOSIS — Z9889 Other specified postprocedural states: Secondary | ICD-10-CM | POA: Diagnosis not present

## 2024-06-14 DIAGNOSIS — M5136 Other intervertebral disc degeneration, lumbar region with discogenic back pain only: Secondary | ICD-10-CM | POA: Diagnosis not present

## 2024-06-15 ENCOUNTER — Other Ambulatory Visit: Payer: Self-pay | Admitting: Family Medicine

## 2024-06-15 DIAGNOSIS — M5136 Other intervertebral disc degeneration, lumbar region with discogenic back pain only: Secondary | ICD-10-CM

## 2024-06-15 DIAGNOSIS — M6281 Muscle weakness (generalized): Secondary | ICD-10-CM | POA: Diagnosis not present

## 2024-06-15 DIAGNOSIS — R2689 Other abnormalities of gait and mobility: Secondary | ICD-10-CM

## 2024-06-15 DIAGNOSIS — K7030 Alcoholic cirrhosis of liver without ascites: Secondary | ICD-10-CM

## 2024-06-15 DIAGNOSIS — Z9889 Other specified postprocedural states: Secondary | ICD-10-CM

## 2024-06-15 DIAGNOSIS — M545 Low back pain, unspecified: Secondary | ICD-10-CM | POA: Diagnosis not present

## 2024-06-15 DIAGNOSIS — Z9181 History of falling: Secondary | ICD-10-CM | POA: Diagnosis not present

## 2024-06-15 DIAGNOSIS — R270 Ataxia, unspecified: Secondary | ICD-10-CM

## 2024-06-20 DIAGNOSIS — R2689 Other abnormalities of gait and mobility: Secondary | ICD-10-CM | POA: Diagnosis not present

## 2024-06-20 DIAGNOSIS — M6281 Muscle weakness (generalized): Secondary | ICD-10-CM | POA: Diagnosis not present

## 2024-06-20 DIAGNOSIS — Z9181 History of falling: Secondary | ICD-10-CM | POA: Diagnosis not present

## 2024-06-20 DIAGNOSIS — M545 Low back pain, unspecified: Secondary | ICD-10-CM | POA: Diagnosis not present

## 2024-06-22 DIAGNOSIS — M545 Low back pain, unspecified: Secondary | ICD-10-CM | POA: Diagnosis not present

## 2024-06-22 DIAGNOSIS — Z9181 History of falling: Secondary | ICD-10-CM | POA: Diagnosis not present

## 2024-06-22 DIAGNOSIS — M6281 Muscle weakness (generalized): Secondary | ICD-10-CM | POA: Diagnosis not present

## 2024-06-22 DIAGNOSIS — R2689 Other abnormalities of gait and mobility: Secondary | ICD-10-CM | POA: Diagnosis not present

## 2024-06-27 DIAGNOSIS — M545 Low back pain, unspecified: Secondary | ICD-10-CM | POA: Diagnosis not present

## 2024-06-27 DIAGNOSIS — Z9181 History of falling: Secondary | ICD-10-CM | POA: Diagnosis not present

## 2024-06-27 DIAGNOSIS — R2689 Other abnormalities of gait and mobility: Secondary | ICD-10-CM | POA: Diagnosis not present

## 2024-06-27 DIAGNOSIS — M6281 Muscle weakness (generalized): Secondary | ICD-10-CM | POA: Diagnosis not present

## 2024-06-29 ENCOUNTER — Ambulatory Visit
Admission: RE | Admit: 2024-06-29 | Discharge: 2024-06-29 | Disposition: A | Discharge status: Home / Self Care | Source: Ambulatory Visit | Attending: Family Medicine | Admitting: Family Medicine

## 2024-06-29 DIAGNOSIS — R270 Ataxia, unspecified: Secondary | ICD-10-CM | POA: Diagnosis not present

## 2024-06-29 DIAGNOSIS — Z9181 History of falling: Secondary | ICD-10-CM | POA: Diagnosis not present

## 2024-06-29 DIAGNOSIS — M545 Low back pain, unspecified: Secondary | ICD-10-CM | POA: Diagnosis not present

## 2024-06-29 DIAGNOSIS — M6281 Muscle weakness (generalized): Secondary | ICD-10-CM | POA: Diagnosis not present

## 2024-06-29 DIAGNOSIS — Z9889 Other specified postprocedural states: Secondary | ICD-10-CM | POA: Diagnosis not present

## 2024-06-29 DIAGNOSIS — M5136 Other intervertebral disc degeneration, lumbar region with discogenic back pain only: Secondary | ICD-10-CM

## 2024-06-29 DIAGNOSIS — K7030 Alcoholic cirrhosis of liver without ascites: Secondary | ICD-10-CM

## 2024-06-29 DIAGNOSIS — R2689 Other abnormalities of gait and mobility: Secondary | ICD-10-CM

## 2024-06-29 DIAGNOSIS — K730 Chronic persistent hepatitis, not elsewhere classified: Secondary | ICD-10-CM | POA: Diagnosis not present

## 2024-06-29 MED ORDER — GADOPICLENOL 0.5 MMOL/ML IV SOLN
10.0000 mL | Freq: Once | INTRAVENOUS | Status: AC | PRN
  Administered 2024-06-29: 8 mL via INTRAVENOUS

## 2024-07-04 DIAGNOSIS — M545 Low back pain, unspecified: Secondary | ICD-10-CM | POA: Diagnosis not present

## 2024-07-04 DIAGNOSIS — Z9181 History of falling: Secondary | ICD-10-CM | POA: Diagnosis not present

## 2024-07-04 DIAGNOSIS — R2689 Other abnormalities of gait and mobility: Secondary | ICD-10-CM | POA: Diagnosis not present

## 2024-07-04 DIAGNOSIS — M6281 Muscle weakness (generalized): Secondary | ICD-10-CM | POA: Diagnosis not present

## 2024-07-11 DIAGNOSIS — R2689 Other abnormalities of gait and mobility: Secondary | ICD-10-CM | POA: Diagnosis not present

## 2024-07-11 DIAGNOSIS — M6281 Muscle weakness (generalized): Secondary | ICD-10-CM | POA: Diagnosis not present

## 2024-07-11 DIAGNOSIS — M545 Low back pain, unspecified: Secondary | ICD-10-CM | POA: Diagnosis not present

## 2024-07-11 DIAGNOSIS — Z9181 History of falling: Secondary | ICD-10-CM | POA: Diagnosis not present

## 2024-07-13 DIAGNOSIS — M6281 Muscle weakness (generalized): Secondary | ICD-10-CM | POA: Diagnosis not present

## 2024-07-13 DIAGNOSIS — M545 Low back pain, unspecified: Secondary | ICD-10-CM | POA: Diagnosis not present

## 2024-07-13 DIAGNOSIS — Z9181 History of falling: Secondary | ICD-10-CM | POA: Diagnosis not present

## 2024-07-13 DIAGNOSIS — R2689 Other abnormalities of gait and mobility: Secondary | ICD-10-CM | POA: Diagnosis not present

## 2024-07-18 DIAGNOSIS — M6281 Muscle weakness (generalized): Secondary | ICD-10-CM | POA: Diagnosis not present

## 2024-07-18 DIAGNOSIS — R2689 Other abnormalities of gait and mobility: Secondary | ICD-10-CM | POA: Diagnosis not present

## 2024-07-18 DIAGNOSIS — M545 Low back pain, unspecified: Secondary | ICD-10-CM | POA: Diagnosis not present

## 2024-07-18 DIAGNOSIS — Z9181 History of falling: Secondary | ICD-10-CM | POA: Diagnosis not present

## 2024-07-20 DIAGNOSIS — M545 Low back pain, unspecified: Secondary | ICD-10-CM | POA: Diagnosis not present

## 2024-07-20 DIAGNOSIS — M6281 Muscle weakness (generalized): Secondary | ICD-10-CM | POA: Diagnosis not present

## 2024-07-20 DIAGNOSIS — Z9181 History of falling: Secondary | ICD-10-CM | POA: Diagnosis not present

## 2024-07-20 DIAGNOSIS — R2689 Other abnormalities of gait and mobility: Secondary | ICD-10-CM | POA: Diagnosis not present

## 2024-07-25 DIAGNOSIS — Z9181 History of falling: Secondary | ICD-10-CM | POA: Diagnosis not present

## 2024-07-25 DIAGNOSIS — M6281 Muscle weakness (generalized): Secondary | ICD-10-CM | POA: Diagnosis not present

## 2024-07-25 DIAGNOSIS — R2689 Other abnormalities of gait and mobility: Secondary | ICD-10-CM | POA: Diagnosis not present

## 2024-07-25 DIAGNOSIS — M545 Low back pain, unspecified: Secondary | ICD-10-CM | POA: Diagnosis not present

## 2024-07-27 DIAGNOSIS — M6281 Muscle weakness (generalized): Secondary | ICD-10-CM | POA: Diagnosis not present

## 2024-07-27 DIAGNOSIS — M545 Low back pain, unspecified: Secondary | ICD-10-CM | POA: Diagnosis not present

## 2024-07-27 DIAGNOSIS — Z9181 History of falling: Secondary | ICD-10-CM | POA: Diagnosis not present

## 2024-07-27 DIAGNOSIS — R2689 Other abnormalities of gait and mobility: Secondary | ICD-10-CM | POA: Diagnosis not present

## 2024-08-01 DIAGNOSIS — M6281 Muscle weakness (generalized): Secondary | ICD-10-CM | POA: Diagnosis not present

## 2024-08-01 DIAGNOSIS — R2689 Other abnormalities of gait and mobility: Secondary | ICD-10-CM | POA: Diagnosis not present

## 2024-08-01 DIAGNOSIS — Z9181 History of falling: Secondary | ICD-10-CM | POA: Diagnosis not present

## 2024-08-01 DIAGNOSIS — M545 Low back pain, unspecified: Secondary | ICD-10-CM | POA: Diagnosis not present

## 2024-08-03 DIAGNOSIS — M545 Low back pain, unspecified: Secondary | ICD-10-CM | POA: Diagnosis not present

## 2024-08-03 DIAGNOSIS — M6281 Muscle weakness (generalized): Secondary | ICD-10-CM | POA: Diagnosis not present

## 2024-08-03 DIAGNOSIS — R2689 Other abnormalities of gait and mobility: Secondary | ICD-10-CM | POA: Diagnosis not present

## 2024-08-03 DIAGNOSIS — Z9181 History of falling: Secondary | ICD-10-CM | POA: Diagnosis not present

## 2024-08-08 DIAGNOSIS — M545 Low back pain, unspecified: Secondary | ICD-10-CM | POA: Diagnosis not present

## 2024-08-08 DIAGNOSIS — Z9181 History of falling: Secondary | ICD-10-CM | POA: Diagnosis not present

## 2024-08-08 DIAGNOSIS — R2689 Other abnormalities of gait and mobility: Secondary | ICD-10-CM | POA: Diagnosis not present

## 2024-08-08 DIAGNOSIS — M6281 Muscle weakness (generalized): Secondary | ICD-10-CM | POA: Diagnosis not present

## 2024-08-10 DIAGNOSIS — M6281 Muscle weakness (generalized): Secondary | ICD-10-CM | POA: Diagnosis not present

## 2024-08-10 DIAGNOSIS — R2689 Other abnormalities of gait and mobility: Secondary | ICD-10-CM | POA: Diagnosis not present

## 2024-08-10 DIAGNOSIS — M545 Low back pain, unspecified: Secondary | ICD-10-CM | POA: Diagnosis not present

## 2024-08-10 DIAGNOSIS — Z9181 History of falling: Secondary | ICD-10-CM | POA: Diagnosis not present

## 2024-08-15 DIAGNOSIS — M545 Low back pain, unspecified: Secondary | ICD-10-CM | POA: Diagnosis not present

## 2024-08-15 DIAGNOSIS — M6281 Muscle weakness (generalized): Secondary | ICD-10-CM | POA: Diagnosis not present

## 2024-08-15 DIAGNOSIS — R2689 Other abnormalities of gait and mobility: Secondary | ICD-10-CM | POA: Diagnosis not present

## 2024-08-15 DIAGNOSIS — Z9181 History of falling: Secondary | ICD-10-CM | POA: Diagnosis not present

## 2024-08-17 DIAGNOSIS — M545 Low back pain, unspecified: Secondary | ICD-10-CM | POA: Diagnosis not present

## 2024-08-17 DIAGNOSIS — Z9181 History of falling: Secondary | ICD-10-CM | POA: Diagnosis not present

## 2024-08-17 DIAGNOSIS — M6281 Muscle weakness (generalized): Secondary | ICD-10-CM | POA: Diagnosis not present

## 2024-08-17 DIAGNOSIS — R2689 Other abnormalities of gait and mobility: Secondary | ICD-10-CM | POA: Diagnosis not present

## 2024-08-22 DIAGNOSIS — R2689 Other abnormalities of gait and mobility: Secondary | ICD-10-CM | POA: Diagnosis not present

## 2024-08-22 DIAGNOSIS — M545 Low back pain, unspecified: Secondary | ICD-10-CM | POA: Diagnosis not present

## 2024-08-22 DIAGNOSIS — Z9181 History of falling: Secondary | ICD-10-CM | POA: Diagnosis not present

## 2024-08-22 DIAGNOSIS — M6281 Muscle weakness (generalized): Secondary | ICD-10-CM | POA: Diagnosis not present

## 2024-08-24 DIAGNOSIS — M6281 Muscle weakness (generalized): Secondary | ICD-10-CM | POA: Diagnosis not present

## 2024-08-24 DIAGNOSIS — Z9181 History of falling: Secondary | ICD-10-CM | POA: Diagnosis not present

## 2024-08-24 DIAGNOSIS — R2689 Other abnormalities of gait and mobility: Secondary | ICD-10-CM | POA: Diagnosis not present

## 2024-08-24 DIAGNOSIS — M545 Low back pain, unspecified: Secondary | ICD-10-CM | POA: Diagnosis not present

## 2024-08-29 DIAGNOSIS — R2689 Other abnormalities of gait and mobility: Secondary | ICD-10-CM | POA: Diagnosis not present

## 2024-08-29 DIAGNOSIS — Z9181 History of falling: Secondary | ICD-10-CM | POA: Diagnosis not present

## 2024-08-29 DIAGNOSIS — M545 Low back pain, unspecified: Secondary | ICD-10-CM | POA: Diagnosis not present

## 2024-08-29 DIAGNOSIS — M6281 Muscle weakness (generalized): Secondary | ICD-10-CM | POA: Diagnosis not present

## 2024-09-07 DIAGNOSIS — M6281 Muscle weakness (generalized): Secondary | ICD-10-CM | POA: Diagnosis not present

## 2024-09-07 DIAGNOSIS — R2689 Other abnormalities of gait and mobility: Secondary | ICD-10-CM | POA: Diagnosis not present

## 2024-09-07 DIAGNOSIS — Z9181 History of falling: Secondary | ICD-10-CM | POA: Diagnosis not present

## 2024-09-07 DIAGNOSIS — M545 Low back pain, unspecified: Secondary | ICD-10-CM | POA: Diagnosis not present

## 2024-09-12 DIAGNOSIS — M6281 Muscle weakness (generalized): Secondary | ICD-10-CM | POA: Diagnosis not present

## 2024-09-12 DIAGNOSIS — R2689 Other abnormalities of gait and mobility: Secondary | ICD-10-CM | POA: Diagnosis not present

## 2024-09-12 DIAGNOSIS — Z9181 History of falling: Secondary | ICD-10-CM | POA: Diagnosis not present

## 2024-09-12 DIAGNOSIS — M545 Low back pain, unspecified: Secondary | ICD-10-CM | POA: Diagnosis not present

## 2024-09-13 DIAGNOSIS — R748 Abnormal levels of other serum enzymes: Secondary | ICD-10-CM | POA: Diagnosis not present

## 2024-09-13 DIAGNOSIS — K219 Gastro-esophageal reflux disease without esophagitis: Secondary | ICD-10-CM | POA: Diagnosis not present

## 2024-09-13 DIAGNOSIS — K7030 Alcoholic cirrhosis of liver without ascites: Secondary | ICD-10-CM | POA: Diagnosis not present

## 2024-09-13 DIAGNOSIS — D696 Thrombocytopenia, unspecified: Secondary | ICD-10-CM | POA: Diagnosis not present

## 2024-09-19 DIAGNOSIS — M545 Low back pain, unspecified: Secondary | ICD-10-CM | POA: Diagnosis not present

## 2024-09-19 DIAGNOSIS — Z9181 History of falling: Secondary | ICD-10-CM | POA: Diagnosis not present

## 2024-09-19 DIAGNOSIS — R2689 Other abnormalities of gait and mobility: Secondary | ICD-10-CM | POA: Diagnosis not present

## 2024-09-19 DIAGNOSIS — M6281 Muscle weakness (generalized): Secondary | ICD-10-CM | POA: Diagnosis not present

## 2024-09-20 DIAGNOSIS — M48061 Spinal stenosis, lumbar region without neurogenic claudication: Secondary | ICD-10-CM | POA: Diagnosis not present

## 2024-09-20 DIAGNOSIS — J329 Chronic sinusitis, unspecified: Secondary | ICD-10-CM | POA: Diagnosis not present

## 2024-09-20 DIAGNOSIS — D696 Thrombocytopenia, unspecified: Secondary | ICD-10-CM | POA: Diagnosis not present

## 2024-09-20 DIAGNOSIS — H9193 Unspecified hearing loss, bilateral: Secondary | ICD-10-CM | POA: Diagnosis not present

## 2024-09-20 DIAGNOSIS — J42 Unspecified chronic bronchitis: Secondary | ICD-10-CM | POA: Diagnosis not present

## 2024-09-20 DIAGNOSIS — K7030 Alcoholic cirrhosis of liver without ascites: Secondary | ICD-10-CM | POA: Diagnosis not present

## 2024-09-20 DIAGNOSIS — K219 Gastro-esophageal reflux disease without esophagitis: Secondary | ICD-10-CM | POA: Diagnosis not present

## 2024-09-20 DIAGNOSIS — R251 Tremor, unspecified: Secondary | ICD-10-CM | POA: Diagnosis not present

## 2024-09-20 DIAGNOSIS — R2689 Other abnormalities of gait and mobility: Secondary | ICD-10-CM | POA: Diagnosis not present

## 2024-09-20 DIAGNOSIS — R270 Ataxia, unspecified: Secondary | ICD-10-CM | POA: Diagnosis not present

## 2024-10-10 DIAGNOSIS — Z9181 History of falling: Secondary | ICD-10-CM | POA: Diagnosis not present

## 2024-10-10 DIAGNOSIS — M6281 Muscle weakness (generalized): Secondary | ICD-10-CM | POA: Diagnosis not present

## 2024-10-10 DIAGNOSIS — M545 Low back pain, unspecified: Secondary | ICD-10-CM | POA: Diagnosis not present

## 2024-10-10 DIAGNOSIS — R2689 Other abnormalities of gait and mobility: Secondary | ICD-10-CM | POA: Diagnosis not present

## 2024-10-17 ENCOUNTER — Ambulatory Visit: Admitting: Neurology

## 2024-10-17 ENCOUNTER — Encounter: Payer: Self-pay | Admitting: Neurology

## 2024-10-17 VITALS — BP 160/77 | HR 94 | Ht 64.0 in | Wt 156.0 lb

## 2024-10-17 DIAGNOSIS — R269 Unspecified abnormalities of gait and mobility: Secondary | ICD-10-CM | POA: Diagnosis not present

## 2024-10-17 DIAGNOSIS — F1010 Alcohol abuse, uncomplicated: Secondary | ICD-10-CM

## 2024-10-17 DIAGNOSIS — R251 Tremor, unspecified: Secondary | ICD-10-CM | POA: Diagnosis not present

## 2024-10-17 DIAGNOSIS — G629 Polyneuropathy, unspecified: Secondary | ICD-10-CM | POA: Diagnosis not present

## 2024-10-17 NOTE — Patient Instructions (Signed)
 Continue with physical therapy at the [facility] with your daily exercise Work on reducing alcohol intake with plan to stop Continue current medications Continue to follow up with PCP Continue with use of cane and walker with ambulation Return as needed

## 2024-10-17 NOTE — Progress Notes (Signed)
 [facility]  PATIENT: Darlene Delacruz DOB: 07-12-1946  REQUESTING CLINICIAN: Gerome Brunet, DO HISTORY FROM: Patient and chart review REASON FOR VISIT: Difficulty with balance   HISTORICAL  CHIEF COMPLAINT:  Chief Complaint  Patient presents with   New Patient (Initial Visit)    Pt in room 13. Alone. proficient paper referral for balance problems.    HISTORY OF PRESENT ILLNESS:  Discussed the use of AI scribe software for clinical note transcription with the patient, who gave verbal consent to proceed.  Darlene Delacruz is a 78 year old female with chronic alcoholism, cirrhosis, hypertension and hyperlipidemia who presents with balance problems and leg tremors.  She has been experiencing balance issues and leg tremors for the past years. Her legs 'jerk', and she feels the need to ensure her feet are properly positioned when standing. She uses a walker for assistance and recently fell while attempting to use the bathroom at night, though she did not sustain any injuries.   She underwent back surgery approximately a year ago following a fall that resulted in a cracked vertebra. The surgery involved the use of 'cement' to stabilize the spine. Since the surgery, she has been experiencing balance difficulties and has resumed physical therapy, which she attends regularly. She also engages in walking exercises in an [place].  Since her most recent fall, physical therapy has been restarted  She has a history of chronic alcohol use and alcoholic cirrhosis diagnosed in 2019. She continues to consume one to two glasses of wine daily, which she reports helps her feel more relaxed. She resides in a retirement village in [place] and participates in social activities such as a daily 'happy hour' where she does consume alcohol.  She feels shaky and wobbly upon standing. No family history of tremors.    OTHER MEDICAL CONDITIONS: History of alcohol  use, alcoholic cirrhosis, hypertension, hyperlipidemia   REVIEW OF SYSTEMS: Full 14 system review of systems performed and negative with exception of: As noted in the HPI  ALLERGIES: Allergies  Allergen Reactions   Atorvastatin Calcium     muscle cramps    HOME MEDICATIONS: Outpatient Medications Prior to Visit  Medication Sig Dispense Refill   ibuprofen  (ADVIL ) 200 MG tablet Take 200 mg by mouth every 6 (six) hours as needed for moderate pain.     mirabegron  ER (MYRBETRIQ ) 25 MG TB24 tablet Take 1 tablet (25 mg total) by mouth daily. 30 tablet 5   sodium chloride  (OCEAN) 0.65 % SOLN nasal spray Place 1 spray into both nostrils as needed for congestion.     Ascorbic Acid  (VITAMIN C PO) Take 2,000 mg by mouth daily. (Patient not taking: Reported on 10/17/2024)     lactulose  (CHRONULAC ) 10 GM/15ML solution Take 45 mLs (30 g total) by mouth 3 (three) times daily. (Patient not taking: Reported on 10/17/2024) 236 mL 0   spironolactone  (ALDACTONE ) 50 MG tablet Take 1 tablet (50 mg total) by mouth daily. (Patient not taking: Reported on 10/17/2024) 30 tablet 0   No facility-administered medications prior to visit.    PAST MEDICAL HISTORY: Past Medical History:  Diagnosis Date   Alcoholic cirrhosis (HCC)    Alcoholism (HCC)    Arthritis    Family history of adverse reaction to anesthesia    Mother - PONV   GERD (gastroesophageal reflux disease)    Hyperlipidemia    Hypertension    Pneumonia    Spastic esophagus     PAST  SURGICAL HISTORY: Past Surgical History:  Procedure Laterality Date   BUNIONECTOMY     both feet   CARPAL TUNNEL RELEASE  10/13/2011   Procedure: CARPAL TUNNEL RELEASE;  Surgeon: Arley JONELLE Curia, MD;  Location: [facility];  Service: Orthopedics;  Laterality: Right;   COLONOSCOPY  2018   Dr. Kristie    EAR CYST EXCISION  10/13/2011   Procedure: CYST REMOVAL;  Surgeon: Arley JONELLE Curia, MD;  Location: [facility];  Service: Orthopedics;   Laterality: Right;  excision cyst possible rotator flap, debridement dip right middle finger   left knee arthroscopy     NASAL SEPTOPLASTY W/ TURBINOPLASTY Bilateral 05/17/2020   Procedure: NASAL SEPTOPLASTY WITH TURBINATE REDUCTION;  Surgeon: Herminio Miu, MD;  Location: [facility];  Service: ENT;  Laterality: Bilateral;   REVERSE SHOULDER ARTHROPLASTY Left 04/24/2022   Procedure: REVERSE SHOULDER ARTHROPLASTY;  Surgeon: Kay Kemps, MD;  Location: [facility];  Service: Orthopedics;  Laterality: Left;  with ISB   right rotator cuff repair     TOTAL LAPAROSCOPIC HYSTERECTOMY WITH BILATERAL SALPINGO OOPHORECTOMY     menorrhagia    FAMILY HISTORY: Family History  Problem Relation Age of Onset   Heart failure Mother     SOCIAL HISTORY: Social History   Socioeconomic History   Marital status: Widowed    Spouse name: Not on file   Number of children: 2   Years of education: Not on file   Highest education level: Not on file  Occupational History   Not on file  Tobacco Use   Smoking status: Former    Current packs/day: 0.00    Types: Cigarettes    Quit date: 04/07/1991    Years since quitting: 33.5   Smokeless tobacco: Never  Vaping Use   Vaping status: Never Used  Substance and Sexual Activity   Alcohol use: Yes    Alcohol/week: 2.0 standard drinks of alcohol    Types: 2 Glasses of wine per week   Drug use: No   Sexual activity: Not Currently  Other Topics Concern   Not on file  Social History Narrative   Lives in [place]. Moved back here to be closer to children and grandchildren from [place].   Retired bookkeeper   1 son, 1 daughter   Last EtOH 10/2018   Social Drivers of Corporate Investment Banker Strain: Not on file  Food Insecurity: Not on file  Transportation Needs: Not on file  Physical Activity: Not on file  Stress: Not on file  Social Connections: Not on file  Intimate Partner Violence: Not on file    PHYSICAL EXAM  GENERAL  EXAM/CONSTITUTIONAL: Vitals:  Vitals:   10/17/24 0941  BP: (!) 160/77  Pulse: 94  SpO2: 98%  Weight: 156 lb (70.8 kg)  Height: 5' 4 (1.626 m)   Body mass index is 26.78 kg/m. Wt Readings from Last 3 Encounters:  10/17/24 156 lb (70.8 kg)  04/25/22 175 lb (79.4 kg)  04/24/22 174 lb 12.8 oz (79.3 kg)   Patient is in no distress; well developed, nourished and groomed; neck is supple, appears anxious and restless  MUSCULOSKELETAL: Gait, strength, tone, movements noted in Neurologic exam below  NEUROLOGIC: MENTAL STATUS:      No data to display         awake, alert, oriented to person, place and time recent and remote memory intact normal attention and concentration language fluent, comprehension intact, naming intact fund of knowledge appropriate  CRANIAL  NERVE:  2nd, 3rd, 4th, 6th - Visual fields full to confrontation, extraocular muscles intact, no nystagmus 5th - facial sensation symmetric 7th - facial strength symmetric 8th - hearing intact 9th - palate elevates symmetrically, uvula midline 11th - shoulder shrug symmetric 12th - tongue protrusion midline  MOTOR:  normal bulk and tone, full strength in the BUE, BLE  SENSORY:  normal and symmetric to light touch, decreased pinprick and vibration at the ankle.  Some difficulty with proprioception  COORDINATION:  finger-nose-finger, fine finger movements normal, tremor noted at rest and with movement  GAIT/STATION:  Wide-based, unsteady, uses walker, positive Romberg     DIAGNOSTIC DATA (LABS, IMAGING, TESTING) - I reviewed patient records, labs, notes, testing and imaging myself where available.  Lab Results  Component Value Date   WBC 5.8 01/02/2019   HGB 12.0 04/25/2022   HCT 36.7 04/25/2022   MCV 99.1 01/02/2019   PLT 124 (L) 01/02/2019      Component Value Date/Time   NA 138 04/25/2022 0339   NA 141 09/03/2017 1141   K 4.4 04/25/2022 0339   K 4.1 09/03/2017 1141   CL 109 04/25/2022 0339    CO2 24 04/25/2022 0339   CO2 27 09/03/2017 1141   GLUCOSE 149 (H) 04/25/2022 0339   GLUCOSE 118 09/03/2017 1141   BUN 13 04/25/2022 0339   BUN 11.5 09/03/2017 1141   CREATININE 0.70 04/25/2022 0339   CREATININE 0.8 09/03/2017 1141   CALCIUM 9.3 04/25/2022 0339   CALCIUM 9.5 09/03/2017 1141   PROT 6.5 01/02/2019 0756   PROT 7.3 09/03/2017 1141   ALBUMIN  2.6 (L) 01/02/2019 0756   ALBUMIN  3.4 (L) 09/03/2017 1141   AST 44 (H) 01/02/2019 0756   AST 92 (H) 09/03/2017 1141   ALT 36 01/02/2019 0756   ALT 61 (H) 09/03/2017 1141   ALKPHOS 86 01/02/2019 0756   ALKPHOS 113 09/03/2017 1141   BILITOT 3.4 (H) 01/02/2019 0756   BILITOT 1.57 (H) 09/03/2017 1141   GFRNONAA >60 04/25/2022 0339   GFRAA >60 01/04/2019 0506   Lab Results  Component Value Date   CHOL 263 (H) 09/01/2011   HDL 69.40 09/01/2011   LDLDIRECT 195.6 09/01/2011   TRIG 65.0 09/01/2011   CHOLHDL 4 09/01/2011   No results found for: HGBA1C No results found for: VITAMINB12 No results found for: TSH  MRI Brain 07/04/2024 1. No acute intracranial abnormality. 2. Moderate generalized cerebral volume loss and chronic small-vessel ischemic change  Recent lab in June 2025 showed  Negative RPR  B12 1089  Magnesium  1.8 ANA negative B1 84.4 HIV negative Vitamin D 26 TSH 1.260  ASSESSMENT AND PLAN  77 y.o. year old female with    Gait and balance disturbance most likely due to alcohol-induced brain/cerebellar atrophy and polyneuropathy. MRI shows moderate brain atrophy, a finding that can be seen in chronic alcohol use.  Additional complaints include jerking legs, dizziness upon standing, and a recent fall without injury. Physical examination reveals tremors and neuropathy in the feet.  Alcohol use is contributing to brain atrophy and neuropathy, affecting balance. - Continue physical [place] the [facility] [place]. - Encouraged reduction of alcohol consumption to prevent further brain atrophy and  neuropathy. - Educated on the effects of alcohol on the brain and nerves, emphasizing the importance of reducing intake and plan to stop.  Alcohol use disorder Chronic alcohol use disorder with daily consumption of wine. Alcohol contributes to brain atrophy and neuropathy, affecting balance and causing tremors. Discussed the mechanism  of alcohol's effects on the brain, including receptor inhibition and potential for neuropathy. Advised against abrupt cessation due to potential withdrawal risks. Discussed medication options to reduce alcohol cravings, but I did advise her to contact her PCP when she is ready to stop. - Encouraged gradual reduction of alcohol intake rather than abrupt cessation. - Advised against abrupt cessation of alcohol due to potential withdrawal risks.     1. Abnormal gait   2. Peripheral polyneuropathy   3. Alcohol abuse   4. Tremor      Patient Instructions  Continue with physical therapy at the [facility] Continue with your daily exercise Work on reducing alcohol intake with plan to stop Continue current medications Continue to follow up with PCP Continue with use of cane and walker with ambulation Return as needed  No orders of the defined types were placed in this encounter.   No orders of the defined types were placed in this encounter.   Return if symptoms worsen or fail to improve.  I personally spent a total of 65 minutes in the care of the patient today including preparing to see the patient, getting/reviewing separately obtained history, performing a medically appropriate exam/evaluation, counseling and educating, documenting clinical information in the EHR, independently interpreting results, and communicating results.   Pastor Falling, MD 10/17/2024, 12:31 PM  [facility] [address], [address] [place], [place] [address] [phone]

## 2024-10-26 DIAGNOSIS — R2689 Other abnormalities of gait and mobility: Secondary | ICD-10-CM | POA: Diagnosis not present

## 2024-10-26 DIAGNOSIS — M545 Low back pain, unspecified: Secondary | ICD-10-CM | POA: Diagnosis not present

## 2024-10-26 DIAGNOSIS — M6281 Muscle weakness (generalized): Secondary | ICD-10-CM | POA: Diagnosis not present

## 2024-10-26 DIAGNOSIS — Z9181 History of falling: Secondary | ICD-10-CM | POA: Diagnosis not present
